# Patient Record
Sex: Female | Born: 1940
Health system: Southern US, Community
[De-identification: ages and names within clinical notes are randomized; demographics above are authoritative.]

## PROBLEM LIST (undated history)

## (undated) DIAGNOSIS — E785 Hyperlipidemia, unspecified: Secondary | ICD-10-CM

## (undated) DIAGNOSIS — F329 Major depressive disorder, single episode, unspecified: Secondary | ICD-10-CM

## (undated) DIAGNOSIS — Z9889 Other specified postprocedural states: Secondary | ICD-10-CM

## (undated) DIAGNOSIS — R112 Nausea with vomiting, unspecified: Secondary | ICD-10-CM

## (undated) DIAGNOSIS — C4491 Basal cell carcinoma of skin, unspecified: Secondary | ICD-10-CM

## (undated) DIAGNOSIS — B379 Candidiasis, unspecified: Secondary | ICD-10-CM

## (undated) DIAGNOSIS — I10 Essential (primary) hypertension: Secondary | ICD-10-CM

## (undated) DIAGNOSIS — M1990 Unspecified osteoarthritis, unspecified site: Secondary | ICD-10-CM

## (undated) DIAGNOSIS — N390 Urinary tract infection, site not specified: Secondary | ICD-10-CM

## (undated) DIAGNOSIS — C541 Malignant neoplasm of endometrium: Secondary | ICD-10-CM

## (undated) DIAGNOSIS — E119 Type 2 diabetes mellitus without complications: Secondary | ICD-10-CM

## (undated) DIAGNOSIS — C50919 Malignant neoplasm of unspecified site of unspecified female breast: Secondary | ICD-10-CM

## (undated) HISTORY — DX: Hyperlipidemia, unspecified: E78.5

## (undated) HISTORY — DX: Unspecified osteoarthritis, unspecified site: M19.90

## (undated) HISTORY — DX: Urinary tract infection, site not specified: N39.0

## (undated) HISTORY — PX: ABDOMINAL HYSTERECTOMY: SHX81

## (undated) HISTORY — PX: MASTECTOMY: SHX3

## (undated) HISTORY — DX: Essential (primary) hypertension: I10

## (undated) HISTORY — DX: Type 2 diabetes mellitus without complications: E11.9

## (undated) HISTORY — DX: Basal cell carcinoma of skin, unspecified: C44.91

## (undated) HISTORY — DX: Candidiasis, unspecified: B37.9

## (undated) HISTORY — DX: Major depressive disorder, single episode, unspecified: F32.9

---

## 1988-10-04 DIAGNOSIS — C50919 Malignant neoplasm of unspecified site of unspecified female breast: Secondary | ICD-10-CM

## 1988-10-04 HISTORY — DX: Malignant neoplasm of unspecified site of unspecified female breast: C50.919

## 2001-03-23 ENCOUNTER — Other Ambulatory Visit: Admission: RE | Admit: 2001-03-23 | Discharge: 2001-03-23 | Payer: Self-pay | Admitting: Family Medicine

## 2001-05-23 ENCOUNTER — Ambulatory Visit: Admission: RE | Admit: 2001-05-23 | Discharge: 2001-05-23 | Payer: Self-pay | Admitting: Gynecology

## 2001-06-27 ENCOUNTER — Inpatient Hospital Stay (HOSPITAL_COMMUNITY): Admission: RE | Admit: 2001-06-27 | Discharge: 2001-06-30 | Payer: Self-pay | Admitting: Gynecology

## 2001-08-09 ENCOUNTER — Ambulatory Visit: Admission: RE | Admit: 2001-08-09 | Discharge: 2001-08-09 | Payer: Self-pay | Admitting: Gynecology

## 2005-02-25 ENCOUNTER — Ambulatory Visit: Payer: Self-pay | Admitting: Obstetrics and Gynecology

## 2006-02-28 ENCOUNTER — Ambulatory Visit: Payer: Self-pay | Admitting: Family Medicine

## 2007-06-01 ENCOUNTER — Ambulatory Visit: Payer: Self-pay | Admitting: Family Medicine

## 2007-06-06 ENCOUNTER — Ambulatory Visit: Payer: Self-pay | Admitting: Unknown Physician Specialty

## 2008-06-03 ENCOUNTER — Ambulatory Visit: Payer: Self-pay | Admitting: Family Medicine

## 2009-06-10 ENCOUNTER — Ambulatory Visit: Payer: Self-pay | Admitting: Family Medicine

## 2009-12-18 ENCOUNTER — Ambulatory Visit: Payer: Self-pay | Admitting: Family Medicine

## 2010-06-11 ENCOUNTER — Ambulatory Visit: Payer: Self-pay | Admitting: Family Medicine

## 2011-06-17 ENCOUNTER — Ambulatory Visit: Payer: Self-pay | Admitting: Family Medicine

## 2012-01-19 DIAGNOSIS — E119 Type 2 diabetes mellitus without complications: Secondary | ICD-10-CM | POA: Diagnosis not present

## 2012-01-19 DIAGNOSIS — E785 Hyperlipidemia, unspecified: Secondary | ICD-10-CM | POA: Diagnosis not present

## 2012-01-19 DIAGNOSIS — I10 Essential (primary) hypertension: Secondary | ICD-10-CM | POA: Diagnosis not present

## 2012-01-19 DIAGNOSIS — M1990 Unspecified osteoarthritis, unspecified site: Secondary | ICD-10-CM | POA: Diagnosis not present

## 2012-01-24 DIAGNOSIS — Z79899 Other long term (current) drug therapy: Secondary | ICD-10-CM | POA: Diagnosis not present

## 2012-01-24 DIAGNOSIS — R5381 Other malaise: Secondary | ICD-10-CM | POA: Diagnosis not present

## 2012-01-24 DIAGNOSIS — E785 Hyperlipidemia, unspecified: Secondary | ICD-10-CM | POA: Diagnosis not present

## 2012-01-24 DIAGNOSIS — E119 Type 2 diabetes mellitus without complications: Secondary | ICD-10-CM | POA: Diagnosis not present

## 2012-01-24 DIAGNOSIS — I10 Essential (primary) hypertension: Secondary | ICD-10-CM | POA: Diagnosis not present

## 2012-05-24 DIAGNOSIS — E669 Obesity, unspecified: Secondary | ICD-10-CM | POA: Diagnosis not present

## 2012-05-24 DIAGNOSIS — R079 Chest pain, unspecified: Secondary | ICD-10-CM | POA: Diagnosis not present

## 2012-05-24 DIAGNOSIS — I10 Essential (primary) hypertension: Secondary | ICD-10-CM | POA: Diagnosis not present

## 2012-06-14 DIAGNOSIS — E7800 Pure hypercholesterolemia, unspecified: Secondary | ICD-10-CM | POA: Diagnosis not present

## 2012-06-14 DIAGNOSIS — I10 Essential (primary) hypertension: Secondary | ICD-10-CM | POA: Diagnosis not present

## 2012-06-14 DIAGNOSIS — E669 Obesity, unspecified: Secondary | ICD-10-CM | POA: Diagnosis not present

## 2012-06-14 DIAGNOSIS — Z23 Encounter for immunization: Secondary | ICD-10-CM | POA: Diagnosis not present

## 2012-06-14 DIAGNOSIS — E119 Type 2 diabetes mellitus without complications: Secondary | ICD-10-CM | POA: Diagnosis not present

## 2012-06-19 ENCOUNTER — Ambulatory Visit: Payer: Self-pay | Admitting: Family Medicine

## 2012-06-19 DIAGNOSIS — Z853 Personal history of malignant neoplasm of breast: Secondary | ICD-10-CM | POA: Diagnosis not present

## 2012-06-19 DIAGNOSIS — Z1231 Encounter for screening mammogram for malignant neoplasm of breast: Secondary | ICD-10-CM | POA: Diagnosis not present

## 2012-06-19 DIAGNOSIS — Z9010 Acquired absence of unspecified breast and nipple: Secondary | ICD-10-CM | POA: Diagnosis not present

## 2012-09-13 DIAGNOSIS — I10 Essential (primary) hypertension: Secondary | ICD-10-CM | POA: Diagnosis not present

## 2012-09-13 DIAGNOSIS — C50929 Malignant neoplasm of unspecified site of unspecified male breast: Secondary | ICD-10-CM | POA: Diagnosis not present

## 2012-09-13 DIAGNOSIS — E119 Type 2 diabetes mellitus without complications: Secondary | ICD-10-CM | POA: Diagnosis not present

## 2012-09-13 DIAGNOSIS — Z23 Encounter for immunization: Secondary | ICD-10-CM | POA: Diagnosis not present

## 2012-09-13 DIAGNOSIS — E7800 Pure hypercholesterolemia, unspecified: Secondary | ICD-10-CM | POA: Diagnosis not present

## 2012-10-30 DIAGNOSIS — I10 Essential (primary) hypertension: Secondary | ICD-10-CM | POA: Diagnosis not present

## 2012-10-30 DIAGNOSIS — Z23 Encounter for immunization: Secondary | ICD-10-CM | POA: Diagnosis not present

## 2012-10-30 DIAGNOSIS — J029 Acute pharyngitis, unspecified: Secondary | ICD-10-CM | POA: Diagnosis not present

## 2012-12-27 DIAGNOSIS — I10 Essential (primary) hypertension: Secondary | ICD-10-CM | POA: Diagnosis not present

## 2012-12-27 DIAGNOSIS — E119 Type 2 diabetes mellitus without complications: Secondary | ICD-10-CM | POA: Diagnosis not present

## 2012-12-27 DIAGNOSIS — C50919 Malignant neoplasm of unspecified site of unspecified female breast: Secondary | ICD-10-CM | POA: Diagnosis not present

## 2012-12-27 DIAGNOSIS — E785 Hyperlipidemia, unspecified: Secondary | ICD-10-CM | POA: Diagnosis not present

## 2012-12-27 LAB — HM COLONOSCOPY

## 2013-03-28 DIAGNOSIS — I10 Essential (primary) hypertension: Secondary | ICD-10-CM | POA: Diagnosis not present

## 2013-03-28 DIAGNOSIS — E119 Type 2 diabetes mellitus without complications: Secondary | ICD-10-CM | POA: Diagnosis not present

## 2013-03-28 DIAGNOSIS — C50919 Malignant neoplasm of unspecified site of unspecified female breast: Secondary | ICD-10-CM | POA: Diagnosis not present

## 2013-03-28 DIAGNOSIS — E7800 Pure hypercholesterolemia, unspecified: Secondary | ICD-10-CM | POA: Diagnosis not present

## 2013-04-05 DIAGNOSIS — E119 Type 2 diabetes mellitus without complications: Secondary | ICD-10-CM | POA: Diagnosis not present

## 2013-06-20 ENCOUNTER — Ambulatory Visit: Payer: Self-pay | Admitting: Family Medicine

## 2013-06-20 DIAGNOSIS — Z853 Personal history of malignant neoplasm of breast: Secondary | ICD-10-CM | POA: Diagnosis not present

## 2013-06-20 DIAGNOSIS — Z9010 Acquired absence of unspecified breast and nipple: Secondary | ICD-10-CM | POA: Diagnosis not present

## 2013-06-20 DIAGNOSIS — Z1231 Encounter for screening mammogram for malignant neoplasm of breast: Secondary | ICD-10-CM | POA: Diagnosis not present

## 2013-08-01 DIAGNOSIS — Z0000 Encounter for general adult medical examination without abnormal findings: Secondary | ICD-10-CM | POA: Diagnosis not present

## 2013-08-01 DIAGNOSIS — Z1339 Encounter for screening examination for other mental health and behavioral disorders: Secondary | ICD-10-CM | POA: Diagnosis not present

## 2013-08-01 DIAGNOSIS — Z23 Encounter for immunization: Secondary | ICD-10-CM | POA: Diagnosis not present

## 2013-08-01 DIAGNOSIS — Z1331 Encounter for screening for depression: Secondary | ICD-10-CM | POA: Diagnosis not present

## 2013-08-23 DIAGNOSIS — I789 Disease of capillaries, unspecified: Secondary | ICD-10-CM | POA: Diagnosis not present

## 2013-08-23 DIAGNOSIS — L578 Other skin changes due to chronic exposure to nonionizing radiation: Secondary | ICD-10-CM | POA: Diagnosis not present

## 2013-08-23 DIAGNOSIS — L821 Other seborrheic keratosis: Secondary | ICD-10-CM | POA: Diagnosis not present

## 2013-08-23 DIAGNOSIS — L570 Actinic keratosis: Secondary | ICD-10-CM | POA: Diagnosis not present

## 2013-08-23 DIAGNOSIS — L820 Inflamed seborrheic keratosis: Secondary | ICD-10-CM | POA: Diagnosis not present

## 2013-09-19 ENCOUNTER — Ambulatory Visit: Payer: Self-pay | Admitting: Family Medicine

## 2013-09-19 DIAGNOSIS — J984 Other disorders of lung: Secondary | ICD-10-CM | POA: Diagnosis not present

## 2013-09-19 DIAGNOSIS — E7800 Pure hypercholesterolemia, unspecified: Secondary | ICD-10-CM | POA: Diagnosis not present

## 2013-09-19 DIAGNOSIS — I10 Essential (primary) hypertension: Secondary | ICD-10-CM | POA: Diagnosis not present

## 2013-09-19 DIAGNOSIS — E119 Type 2 diabetes mellitus without complications: Secondary | ICD-10-CM | POA: Diagnosis not present

## 2013-09-19 DIAGNOSIS — R918 Other nonspecific abnormal finding of lung field: Secondary | ICD-10-CM | POA: Diagnosis not present

## 2013-09-19 DIAGNOSIS — M7040 Prepatellar bursitis, unspecified knee: Secondary | ICD-10-CM | POA: Diagnosis not present

## 2013-09-19 DIAGNOSIS — R059 Cough, unspecified: Secondary | ICD-10-CM | POA: Diagnosis not present

## 2013-09-19 DIAGNOSIS — J40 Bronchitis, not specified as acute or chronic: Secondary | ICD-10-CM | POA: Diagnosis not present

## 2013-10-24 ENCOUNTER — Ambulatory Visit: Payer: Self-pay | Admitting: Family Medicine

## 2013-10-24 DIAGNOSIS — R059 Cough, unspecified: Secondary | ICD-10-CM | POA: Diagnosis not present

## 2013-10-24 DIAGNOSIS — Z23 Encounter for immunization: Secondary | ICD-10-CM | POA: Diagnosis not present

## 2013-10-24 DIAGNOSIS — J984 Other disorders of lung: Secondary | ICD-10-CM | POA: Diagnosis not present

## 2013-10-24 DIAGNOSIS — R918 Other nonspecific abnormal finding of lung field: Secondary | ICD-10-CM | POA: Diagnosis not present

## 2013-10-24 DIAGNOSIS — J40 Bronchitis, not specified as acute or chronic: Secondary | ICD-10-CM | POA: Diagnosis not present

## 2013-10-24 DIAGNOSIS — Z1331 Encounter for screening for depression: Secondary | ICD-10-CM | POA: Diagnosis not present

## 2013-10-31 DIAGNOSIS — E119 Type 2 diabetes mellitus without complications: Secondary | ICD-10-CM | POA: Diagnosis not present

## 2013-10-31 DIAGNOSIS — I10 Essential (primary) hypertension: Secondary | ICD-10-CM | POA: Diagnosis not present

## 2013-10-31 DIAGNOSIS — E7800 Pure hypercholesterolemia, unspecified: Secondary | ICD-10-CM | POA: Diagnosis not present

## 2013-10-31 DIAGNOSIS — M7040 Prepatellar bursitis, unspecified knee: Secondary | ICD-10-CM | POA: Diagnosis not present

## 2013-10-31 DIAGNOSIS — J069 Acute upper respiratory infection, unspecified: Secondary | ICD-10-CM | POA: Diagnosis not present

## 2013-11-02 DIAGNOSIS — E119 Type 2 diabetes mellitus without complications: Secondary | ICD-10-CM | POA: Diagnosis not present

## 2013-11-02 DIAGNOSIS — E785 Hyperlipidemia, unspecified: Secondary | ICD-10-CM | POA: Diagnosis not present

## 2013-11-22 DIAGNOSIS — M1710 Unilateral primary osteoarthritis, unspecified knee: Secondary | ICD-10-CM | POA: Diagnosis not present

## 2013-12-11 DIAGNOSIS — L820 Inflamed seborrheic keratosis: Secondary | ICD-10-CM | POA: Diagnosis not present

## 2013-12-11 DIAGNOSIS — L578 Other skin changes due to chronic exposure to nonionizing radiation: Secondary | ICD-10-CM | POA: Diagnosis not present

## 2013-12-11 DIAGNOSIS — L821 Other seborrheic keratosis: Secondary | ICD-10-CM | POA: Diagnosis not present

## 2013-12-11 DIAGNOSIS — L570 Actinic keratosis: Secondary | ICD-10-CM | POA: Diagnosis not present

## 2014-03-07 DIAGNOSIS — F411 Generalized anxiety disorder: Secondary | ICD-10-CM | POA: Diagnosis not present

## 2014-03-07 DIAGNOSIS — E119 Type 2 diabetes mellitus without complications: Secondary | ICD-10-CM | POA: Diagnosis not present

## 2014-03-07 DIAGNOSIS — C50919 Malignant neoplasm of unspecified site of unspecified female breast: Secondary | ICD-10-CM | POA: Diagnosis not present

## 2014-03-07 DIAGNOSIS — M7040 Prepatellar bursitis, unspecified knee: Secondary | ICD-10-CM | POA: Diagnosis not present

## 2014-03-07 DIAGNOSIS — F320 Major depressive disorder, single episode, mild: Secondary | ICD-10-CM | POA: Diagnosis not present

## 2014-06-06 DIAGNOSIS — Z23 Encounter for immunization: Secondary | ICD-10-CM | POA: Diagnosis not present

## 2014-06-06 DIAGNOSIS — C549 Malignant neoplasm of corpus uteri, unspecified: Secondary | ICD-10-CM | POA: Diagnosis not present

## 2014-06-06 DIAGNOSIS — M7040 Prepatellar bursitis, unspecified knee: Secondary | ICD-10-CM | POA: Diagnosis not present

## 2014-06-06 DIAGNOSIS — E785 Hyperlipidemia, unspecified: Secondary | ICD-10-CM | POA: Diagnosis not present

## 2014-06-06 DIAGNOSIS — E1129 Type 2 diabetes mellitus with other diabetic kidney complication: Secondary | ICD-10-CM | POA: Diagnosis not present

## 2014-06-06 DIAGNOSIS — C50919 Malignant neoplasm of unspecified site of unspecified female breast: Secondary | ICD-10-CM | POA: Diagnosis not present

## 2014-07-12 ENCOUNTER — Ambulatory Visit: Payer: Self-pay | Admitting: Family Medicine

## 2014-07-12 DIAGNOSIS — Z853 Personal history of malignant neoplasm of breast: Secondary | ICD-10-CM | POA: Diagnosis not present

## 2014-07-12 DIAGNOSIS — Z1231 Encounter for screening mammogram for malignant neoplasm of breast: Secondary | ICD-10-CM | POA: Diagnosis not present

## 2014-07-12 LAB — HM MAMMOGRAPHY

## 2014-08-01 DIAGNOSIS — Z23 Encounter for immunization: Secondary | ICD-10-CM | POA: Diagnosis not present

## 2014-09-16 DIAGNOSIS — J40 Bronchitis, not specified as acute or chronic: Secondary | ICD-10-CM | POA: Diagnosis not present

## 2014-09-16 DIAGNOSIS — C50919 Malignant neoplasm of unspecified site of unspecified female breast: Secondary | ICD-10-CM | POA: Diagnosis not present

## 2014-09-16 DIAGNOSIS — R05 Cough: Secondary | ICD-10-CM | POA: Diagnosis not present

## 2014-09-16 DIAGNOSIS — J0100 Acute maxillary sinusitis, unspecified: Secondary | ICD-10-CM | POA: Diagnosis not present

## 2014-10-02 DIAGNOSIS — J0100 Acute maxillary sinusitis, unspecified: Secondary | ICD-10-CM | POA: Diagnosis not present

## 2014-10-02 DIAGNOSIS — C50919 Malignant neoplasm of unspecified site of unspecified female breast: Secondary | ICD-10-CM | POA: Diagnosis not present

## 2014-10-02 DIAGNOSIS — Z0000 Encounter for general adult medical examination without abnormal findings: Secondary | ICD-10-CM | POA: Diagnosis not present

## 2014-10-02 DIAGNOSIS — R05 Cough: Secondary | ICD-10-CM | POA: Diagnosis not present

## 2014-11-27 DIAGNOSIS — C50919 Malignant neoplasm of unspecified site of unspecified female breast: Secondary | ICD-10-CM | POA: Diagnosis not present

## 2014-11-27 DIAGNOSIS — I10 Essential (primary) hypertension: Secondary | ICD-10-CM | POA: Diagnosis not present

## 2014-11-27 DIAGNOSIS — E119 Type 2 diabetes mellitus without complications: Secondary | ICD-10-CM | POA: Diagnosis not present

## 2014-11-27 DIAGNOSIS — E785 Hyperlipidemia, unspecified: Secondary | ICD-10-CM | POA: Diagnosis not present

## 2014-11-27 DIAGNOSIS — E669 Obesity, unspecified: Secondary | ICD-10-CM | POA: Diagnosis not present

## 2014-11-27 LAB — LIPID PANEL
CHOLESTEROL: 168 mg/dL (ref 0–200)
HDL: 47 mg/dL (ref 35–70)
LDL CALC: 70 mg/dL
LDl/HDL Ratio: 1.5
TRIGLYCERIDES: 253 mg/dL — AB (ref 40–160)

## 2014-11-27 LAB — BASIC METABOLIC PANEL
BUN: 22 mg/dL — AB (ref 4–21)
CREATININE: 0.8 mg/dL (ref 0.5–1.1)
GLUCOSE: 188 mg/dL
POTASSIUM: 5 mmol/L (ref 3.4–5.3)
SODIUM: 142 mmol/L (ref 137–147)

## 2014-11-27 LAB — HEPATIC FUNCTION PANEL
ALT: 11 U/L (ref 7–35)
AST: 12 U/L — AB (ref 13–35)
Alkaline Phosphatase: 85 U/L (ref 25–125)
BILIRUBIN, TOTAL: 0.3 mg/dL

## 2014-11-27 LAB — HEMOGLOBIN A1C: HEMOGLOBIN A1C: 8.1 % — AB (ref 4.0–6.0)

## 2014-11-27 LAB — CBC AND DIFFERENTIAL
HCT: 42 % (ref 36–46)
Hemoglobin: 14 g/dL (ref 12.0–16.0)
Neutrophils Absolute: 58 /uL
PLATELETS: 264 10*3/uL (ref 150–399)
WBC: 7 10^3/mL

## 2014-11-27 LAB — TSH: TSH: 2.26 u[IU]/mL (ref 0.41–5.90)

## 2015-02-07 DIAGNOSIS — C44310 Basal cell carcinoma of skin of unspecified parts of face: Secondary | ICD-10-CM | POA: Insufficient documentation

## 2015-02-07 DIAGNOSIS — E782 Mixed hyperlipidemia: Secondary | ICD-10-CM | POA: Insufficient documentation

## 2015-02-07 DIAGNOSIS — K3189 Other diseases of stomach and duodenum: Secondary | ICD-10-CM | POA: Insufficient documentation

## 2015-02-07 DIAGNOSIS — C549 Malignant neoplasm of corpus uteri, unspecified: Secondary | ICD-10-CM | POA: Insufficient documentation

## 2015-02-07 DIAGNOSIS — N950 Postmenopausal bleeding: Secondary | ICD-10-CM | POA: Insufficient documentation

## 2015-02-07 DIAGNOSIS — R5383 Other fatigue: Secondary | ICD-10-CM

## 2015-02-07 DIAGNOSIS — M1990 Unspecified osteoarthritis, unspecified site: Secondary | ICD-10-CM | POA: Insufficient documentation

## 2015-02-07 DIAGNOSIS — H6980 Other specified disorders of Eustachian tube, unspecified ear: Secondary | ICD-10-CM | POA: Insufficient documentation

## 2015-02-07 DIAGNOSIS — C50919 Malignant neoplasm of unspecified site of unspecified female breast: Secondary | ICD-10-CM | POA: Insufficient documentation

## 2015-02-07 DIAGNOSIS — F29 Unspecified psychosis not due to a substance or known physiological condition: Secondary | ICD-10-CM | POA: Insufficient documentation

## 2015-02-07 DIAGNOSIS — N840 Polyp of corpus uteri: Secondary | ICD-10-CM | POA: Insufficient documentation

## 2015-02-07 DIAGNOSIS — I10 Essential (primary) hypertension: Secondary | ICD-10-CM | POA: Insufficient documentation

## 2015-02-07 DIAGNOSIS — E876 Hypokalemia: Secondary | ICD-10-CM | POA: Insufficient documentation

## 2015-02-07 DIAGNOSIS — M461 Sacroiliitis, not elsewhere classified: Secondary | ICD-10-CM | POA: Insufficient documentation

## 2015-02-07 DIAGNOSIS — F329 Major depressive disorder, single episode, unspecified: Secondary | ICD-10-CM | POA: Insufficient documentation

## 2015-02-07 DIAGNOSIS — J309 Allergic rhinitis, unspecified: Secondary | ICD-10-CM | POA: Insufficient documentation

## 2015-02-07 DIAGNOSIS — Z79890 Hormone replacement therapy (postmenopausal): Secondary | ICD-10-CM | POA: Insufficient documentation

## 2015-02-07 DIAGNOSIS — E119 Type 2 diabetes mellitus without complications: Secondary | ICD-10-CM | POA: Insufficient documentation

## 2015-02-07 DIAGNOSIS — R5381 Other malaise: Secondary | ICD-10-CM | POA: Insufficient documentation

## 2015-02-07 DIAGNOSIS — E669 Obesity, unspecified: Secondary | ICD-10-CM | POA: Insufficient documentation

## 2015-03-26 ENCOUNTER — Encounter: Payer: Self-pay | Admitting: Family Medicine

## 2015-03-26 ENCOUNTER — Ambulatory Visit (INDEPENDENT_AMBULATORY_CARE_PROVIDER_SITE_OTHER): Payer: Medicare Other | Admitting: Family Medicine

## 2015-03-26 VITALS — BP 114/72 | HR 78 | Temp 98.5°F | Resp 18 | Ht 64.0 in | Wt 209.0 lb

## 2015-03-26 DIAGNOSIS — E669 Obesity, unspecified: Secondary | ICD-10-CM | POA: Diagnosis not present

## 2015-03-26 DIAGNOSIS — I10 Essential (primary) hypertension: Secondary | ICD-10-CM

## 2015-03-26 DIAGNOSIS — E119 Type 2 diabetes mellitus without complications: Secondary | ICD-10-CM

## 2015-03-26 MED ORDER — CANAGLIFLOZIN-METFORMIN HCL 150-500 MG PO TABS: 1.0000 | ORAL_TABLET | Freq: Two times a day (BID) | ORAL | Status: DC

## 2015-03-26 NOTE — Progress Notes (Signed)
Patient ID: Lori Berger, female   DOB: [birth year], 74 y.o.   MRN: [id number]    Subjective:  Hypertension This is a chronic (HEre for follow up. She does check her B/P and readings are usually around 121/65.) problem. Pertinent negatives include no anxiety, blurred vision, chest pain, headaches, malaise/fatigue, neck pain, palpitations or shortness of breath.  Diabetes She presents for her follow-up diabetic visit. She has type 2 diabetes mellitus. There are no hypoglycemic associated symptoms. Pertinent negatives for hypoglycemia include no dizziness or headaches. Associated symptoms include foot paresthesias. Pertinent negatives for diabetes include no blurred vision, no chest pain, no foot ulcerations, no polyuria and no weakness. There are no hypoglycemic complications. There are no diabetic complications. Risk factors for coronary artery disease include obesity, hypertension and diabetes mellitus. Current diabetic treatment includes oral agent (dual therapy) (she is taking Glimiperide and Janduato. Due to cost wants to discuss switching Jentaduato to something else.). She is compliant with treatment all of the time. She participates in exercise intermittently. (Around 170 usually.) She does not see a podiatrist.Eye exam is not current.   Knee pain-chronic Chronic issue. Right knee is bone on bone per ortho she seen before and her left knee is bothering her now too. She has hard time walking on uneven ground and longer distance.   Prior to Admission medications   Medication Sig Start Date End Date Taking? Authorizing Provider  amLODipine (NORVASC) 5 MG tablet Take 1 tablet by mouth daily. 10/02/14   Historical Provider, MD  glimepiride (AMARYL) 4 MG tablet Take 1 tablet by mouth daily. 10/02/14   Historical Provider, MD  Linagliptin-Metformin HCl 2.5-500 MG TABS Take 1 tablet by mouth 2 (two) times daily.  01/09/15   Historical Provider, MD  lisinopril (PRINIVIL,ZESTRIL) 20 MG tablet Take 1  tablet by mouth daily. 10/02/14   Historical Provider, MD  simvastatin (ZOCOR) 10 MG tablet Take 1 tablet by mouth at bedtime. 12/23/14   Historical Provider, MD    Patient Active Problem List   Diagnosis Date Noted  . Basal cell carcinoma of face 02/07/2015  . Breast CA 02/07/2015  . Diabetes mellitus, type 2 02/07/2015  . Dysfunction of eustachian tube 02/07/2015  . 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 Malignant neoplasm of corpus uteri, except isthmus 02/07/2015  . Polyp of corpus uteri 02/07/2015  . Essential (primary) hypertension 02/07/2015  . Need for prophylactic hormone replacement therapy (postmenopausal) 02/07/2015  . Decreased potassium in the blood 02/07/2015  . 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 Malaise and fatigue 02/07/2015  . Combined fat and carbohydrate induced hyperlipemia 02/07/2015  . Adiposity 02/07/2015  . Arthritis, degenerative 02/07/2015  . Allergic rhinitis 02/07/2015  . Postmenopausal bleeding 02/07/2015  . Psychosis 02/07/2015  . Inflammation of sacroiliac joint 02/07/2015    No past medical history on file.  History   Social History  . Marital Status: Widowed    Spouse Name: widowed  . Number of Children: 3  . Years of Education: N/A   Occupational History  . Not on file.   Social History Main Topics  . Smoking status: Never Smoker   . Smokeless tobacco: Never Used  . Alcohol Use: No  . Drug Use: No  . Sexual Activity: No   Other Topics Concern  . Not on file   Social History Narrative    Allergies  Allergen Reactions  . Penicillins     Review of Systems  Constitutional: Negative for fever, chills and malaise/fatigue.  Eyes:  Negative for blurred vision.  Respiratory: Negative for cough, shortness of breath and wheezing.   Cardiovascular: Positive for leg swelling (some edema in her feet and lower legs at times.). Negative for chest pain and palpitations.  Gastrointestinal: Negative for heartburn,  nausea, vomiting and abdominal pain.  Genitourinary: Negative.   Musculoskeletal: Positive for joint pain. Negative for myalgias, back pain and neck pain.  Neurological: Negative for dizziness, weakness and headaches.  Endo/Heme/Allergies: Negative.   Psychiatric/Behavioral: Negative.     Immunization History  Administered Date(s) Administered  . Pneumococcal Conjugate-13 06/06/2014  . Pneumococcal Polysaccharide-23 06/14/2012  . Tdap 05/31/2011  . Zoster 11/17/2007   Objective:  BP 114/72 mmHg  Pulse 78  Temp(Src) 98.5 F (36.9 C)  Resp 18  Ht 5\' 4"  (1.626 m)  Wt 209 lb (94.802 kg)  BMI 35.86 kg/m2  Physical Exam  Constitutional: She is oriented to person, place, and time and well-developed, well-nourished, and in no distress. No distress.  HENT:  Head: Normocephalic and atraumatic.  Right Ear: External ear normal.  Left Ear: External ear normal.  Nose: Nose normal.  Eyes: Conjunctivae and EOM are normal. Pupils are equal, round, and reactive to light.  Neck: Normal range of motion. Neck supple.  Cardiovascular: Normal rate, regular rhythm, normal heart sounds and intact distal pulses.   No murmur heard. Pulmonary/Chest: Effort normal and breath sounds normal. She has no wheezes. She exhibits no tenderness.  Abdominal: Soft. Bowel sounds are normal. There is no tenderness.  Musculoskeletal: Normal range of motion.  Neurological: She is alert and oriented to person, place, and time.  Skin: Skin is warm and dry.  Psychiatric: Mood, memory, affect and judgment normal.    Lab Results  Component Value Date   WBC 7.0 11/27/2014   HGB 14.0 11/27/2014   HCT 42 11/27/2014   PLT 264 11/27/2014   CHOL 168 11/27/2014   TRIG 253* 11/27/2014   HDL 47 11/27/2014   LDLCALC 70 11/27/2014   TSH 2.26 11/27/2014   HGBA1C 8.1* 11/27/2014    CMP     Component Value Date/Time   NA 142 11/27/2014   K 5.0 11/27/2014   BUN 22* 11/27/2014   CREATININE 0.8 11/27/2014   AST 12*  11/27/2014   ALT 11 11/27/2014   ALKPHOS 85 11/27/2014    Assessment and Plan :  1. Essential (primary) hypertension Stable.  2. Type 2 diabetes mellitus without complication L8G 8.4 today. Worse. Due to insurance will stop Jentadueto and start Invokamet. Will not make any other changes at this time and re access her sugar level on the next visit. RTC 3-4 months.  3. Adiposity Work on diet and exercise as much as she can.  4. Chronic knee pain Handicap form filled out and advised patient this is temporarily at this time and to continue trying to stay active. May need refferral to ortho in the future.  5. Breast cancer    Patient was seen and examined by Dr. Eulas Post and note was scribed by Theressa Millard, RMA.  Miguel Aschoff MD [facility] 03/26/2015 11:45 AM

## 2015-04-30 ENCOUNTER — Encounter: Payer: Self-pay | Admitting: Family Medicine

## 2015-04-30 ENCOUNTER — Other Ambulatory Visit: Payer: Self-pay | Admitting: Family Medicine

## 2015-04-30 ENCOUNTER — Ambulatory Visit (INDEPENDENT_AMBULATORY_CARE_PROVIDER_SITE_OTHER): Payer: Medicare Other | Admitting: Family Medicine

## 2015-04-30 VITALS — BP 112/72 | HR 90 | Temp 97.9°F | Resp 16 | Wt 203.4 lb

## 2015-04-30 DIAGNOSIS — N3090 Cystitis, unspecified without hematuria: Secondary | ICD-10-CM

## 2015-04-30 LAB — POCT URINALYSIS DIPSTICK
Glucose, UA: 2
NITRITE UA: POSITIVE
Protein, UA: 100
UROBILINOGEN UA: 0.2
pH, UA: 5

## 2015-04-30 MED ORDER — SULFAMETHOXAZOLE-TRIMETHOPRIM 800-160 MG PO TABS: 1.0000 | ORAL_TABLET | Freq: Two times a day (BID) | ORAL | Status: DC

## 2015-04-30 NOTE — Patient Instructions (Signed)
We will call you with urine culture results.

## 2015-04-30 NOTE — Progress Notes (Signed)
Subjective:     Patient ID: Lori Berger, female   DOB: 09-09-1941, 74 y.o.   MRN: [id number]  HPI  Chief Complaint  Patient presents with  . Urinary Frequency    Patient comes in office today with complaints of frequency of urination that began on Monday 7/25 and burining with urination began 07/26  She was recently started on canagliflozin for diabetes. No recent history of UTI.   Review of Systems  Constitutional: Negative for fever and chills.        Objective:   Physical Exam  Constitutional: She appears well-developed and well-nourished. No distress.  Genitourinary:  No cva tenderness       Assessment:    1. Cystitis without hematuri - sulfamethoxazole-trimethoprim (BACTRIM DS,SEPTRA DS) 800-160 MG per tablet; Take 1 tablet by mouth 2 (two) times daily.  Dispense: 14 tablet; Refill: 0 - Urine culture - POCT urinalysis dipstick    Plan:    F/u pending urine culture.

## 2015-05-03 LAB — URINE CULTURE

## 2015-05-05 ENCOUNTER — Telehealth: Payer: Self-pay

## 2015-05-05 NOTE — Telephone Encounter (Signed)
-----   Message from Carmon Ginsberg, [place] sent at 05/05/2015  8:30 AM EDT ----- Continue Sulfa antibiotic for a routine E. Coli bladder infection.

## 2015-05-05 NOTE — Telephone Encounter (Signed)
Patient has been advised of results.  

## 2015-05-29 DIAGNOSIS — L578 Other skin changes due to chronic exposure to nonionizing radiation: Secondary | ICD-10-CM | POA: Diagnosis not present

## 2015-05-29 DIAGNOSIS — L820 Inflamed seborrheic keratosis: Secondary | ICD-10-CM | POA: Diagnosis not present

## 2015-05-29 DIAGNOSIS — R208 Other disturbances of skin sensation: Secondary | ICD-10-CM | POA: Diagnosis not present

## 2015-05-29 DIAGNOSIS — L821 Other seborrheic keratosis: Secondary | ICD-10-CM | POA: Diagnosis not present

## 2015-06-25 ENCOUNTER — Ambulatory Visit (INDEPENDENT_AMBULATORY_CARE_PROVIDER_SITE_OTHER): Payer: Medicare Other | Admitting: Family Medicine

## 2015-06-25 ENCOUNTER — Encounter: Payer: Self-pay | Admitting: Family Medicine

## 2015-06-25 VITALS — BP 118/70 | HR 80 | Temp 98.3°F | Resp 16 | Ht 64.0 in | Wt 202.0 lb

## 2015-06-25 DIAGNOSIS — E7800 Pure hypercholesterolemia, unspecified: Secondary | ICD-10-CM

## 2015-06-25 DIAGNOSIS — E119 Type 2 diabetes mellitus without complications: Secondary | ICD-10-CM

## 2015-06-25 DIAGNOSIS — Z23 Encounter for immunization: Secondary | ICD-10-CM

## 2015-06-25 LAB — POCT GLYCOSYLATED HEMOGLOBIN (HGB A1C): HEMOGLOBIN A1C: 7.4

## 2015-06-25 NOTE — Progress Notes (Signed)
Patient ID: Lori Berger, female   DOB: Feb 13, 1941, 74 y.o.   MRN: [id number]       Patient: Lori Berger Female    DOB: [birth year]   74 y.o.   MRN: [id number] Visit Date: 06/25/2015  Today's Neeka Urista: Wilhemena Durie, MD   Chief Complaint  Patient presents with  . Diabetes    3 month F/U   Subjective:    Diabetes She has type 2 diabetes mellitus. Her disease course has been stable. There are no hypoglycemic associated symptoms. There are no diabetic associated symptoms. There are no hypoglycemic complications. Symptoms are stable. There are no diabetic complications. She is compliant with treatment all of the time. She does not see a podiatrist.Eye exam is not current.  Patient was seen in the office about 3 months ago and was started on Invokamet. Patient had to discontinue Jentadueto due to cost. Patient reports that she has not had any side effects since starting medication.      Allergies  Allergen Reactions  . Penicillins    Previous Medications   AMLODIPINE (NORVASC) 5 MG TABLET    Take 1 tablet by mouth daily.   CANAGLIFLOZIN-METFORMIN HCL 150-500 MG TABS    Take 1 tablet by mouth 2 (two) times daily.   GLIMEPIRIDE (AMARYL) 4 MG TABLET    Take 1 tablet by mouth daily.   LISINOPRIL (PRINIVIL,ZESTRIL) 20 MG TABLET    Take 1 tablet by mouth daily.   SIMVASTATIN (ZOCOR) 10 MG TABLET    Take 1 tablet by mouth at bedtime.   SULFAMETHOXAZOLE-TRIMETHOPRIM (BACTRIM DS,SEPTRA DS) 800-160 MG PER TABLET    Take 1 tablet by mouth 2 (two) times daily.    Review of Systems  Constitutional: Negative.   Respiratory: Negative.   Cardiovascular: Negative.   Endocrine: Negative.   Neurological: Negative.   Hematological: Negative.   Psychiatric/Behavioral: Negative.     Social History  Substance Use Topics  . Smoking status: Never Smoker   . Smokeless tobacco: Never Used  . Alcohol Use: No   Objective:   BP 118/70 mmHg  Pulse 80  Temp(Src) 98.3 F (36.8 C)  Resp 16   Ht 5\' 4"  (1.626 m)  Wt 202 lb (91.627 kg)  BMI 34.66 kg/m2  Physical Exam  Constitutional: She appears well-developed and well-nourished.  HENT:  Head: Normocephalic and atraumatic.  Right Ear: External ear normal.  Left Ear: External ear normal.  Nose: Nose normal.  Eyes: Conjunctivae are normal.  Neck: Neck supple.  Cardiovascular: Normal rate, regular rhythm and normal heart sounds.   Pulmonary/Chest: Effort normal and breath sounds normal.  Musculoskeletal: She exhibits no edema.  Skin: Skin is warm and dry.  Psychiatric: She has a normal mood and affect. Her behavior is normal. Judgment and thought content normal.        Assessment & Plan:     1. Type 2 diabetes mellitus without complication  -N3Z is 7.4 today--improving control. - Comprehensive Metabolic Panel (CMET)  2. Elevated cholesterol  - Comprehensive Metabolic Panel (CMET) - Lipid Profile  3. Flu vaccine need  - Flu vaccine HIGH DOSE PF (Fluzone High dose),e    4.[place], MD  [facility]

## 2015-06-26 LAB — LIPID PANEL
CHOL/HDL RATIO: 4 ratio (ref 0.0–4.4)
CHOLESTEROL TOTAL: 186 mg/dL (ref 100–199)
HDL: 47 mg/dL (ref >39)
LDL Calculated: 87 mg/dL (ref 0–99)
TRIGLYCERIDES: 259 mg/dL — AB (ref 0–149)
VLDL Cholesterol Cal: 52 mg/dL — ABNORMAL HIGH (ref 5–40)

## 2015-06-26 LAB — COMPREHENSIVE METABOLIC PANEL
A/G RATIO: 1.7 (ref 1.1–2.5)
ALK PHOS: 80 IU/L (ref 39–117)
ALT: 14 IU/L (ref 0–32)
AST: 20 IU/L (ref 0–40)
Albumin: 4.3 g/dL (ref 3.5–4.8)
BILIRUBIN TOTAL: 0.3 mg/dL (ref 0.0–1.2)
BUN/Creatinine Ratio: 18 (ref 11–26)
BUN: 18 mg/dL (ref 8–27)
CALCIUM: 9.6 mg/dL (ref 8.7–10.3)
CHLORIDE: 104 mmol/L (ref 97–108)
CO2: 19 mmol/L (ref 18–29)
Creatinine, Ser: 0.99 mg/dL (ref 0.57–1.00)
GFR calc Af Amer: 64 mL/min/{1.73_m2} (ref >59)
GFR, EST NON AFRICAN AMERICAN: 56 mL/min/{1.73_m2} — AB (ref >59)
Globulin, Total: 2.6 g/dL (ref 1.5–4.5)
Glucose: 145 mg/dL — ABNORMAL HIGH (ref 65–99)
POTASSIUM: 4.6 mmol/L (ref 3.5–5.2)
Sodium: 142 mmol/L (ref 134–144)
Total Protein: 6.9 g/dL (ref 6.0–8.5)

## 2015-06-30 ENCOUNTER — Telehealth: Payer: Self-pay

## 2015-06-30 NOTE — Telephone Encounter (Signed)
10:39 No answer  ED

## 2015-06-30 NOTE — Telephone Encounter (Signed)
Advised  ED 

## 2015-06-30 NOTE — Telephone Encounter (Signed)
-----   Message from Jerrol Banana., MD sent at 06/30/2015  8:15 AM EDT ----- Labs okay

## 2015-08-20 ENCOUNTER — Other Ambulatory Visit: Payer: Self-pay | Admitting: Family Medicine

## 2015-08-20 DIAGNOSIS — Z1231 Encounter for screening mammogram for malignant neoplasm of breast: Secondary | ICD-10-CM

## 2015-08-25 DIAGNOSIS — M1712 Unilateral primary osteoarthritis, left knee: Secondary | ICD-10-CM | POA: Diagnosis not present

## 2015-08-25 DIAGNOSIS — S93602A Unspecified sprain of left foot, initial encounter: Secondary | ICD-10-CM | POA: Diagnosis not present

## 2015-09-02 ENCOUNTER — Ambulatory Visit
Admission: RE | Admit: 2015-09-02 | Discharge: 2015-09-02 | Disposition: A | Discharge status: Home / Self Care | Payer: Medicare Other | Source: Ambulatory Visit | Attending: Family Medicine | Admitting: Family Medicine

## 2015-09-02 DIAGNOSIS — Z1231 Encounter for screening mammogram for malignant neoplasm of breast: Secondary | ICD-10-CM | POA: Insufficient documentation

## 2015-09-02 HISTORY — DX: Malignant neoplasm of endometrium: C54.1

## 2015-09-03 ENCOUNTER — Other Ambulatory Visit: Payer: Self-pay | Admitting: Family Medicine

## 2015-09-03 DIAGNOSIS — R928 Other abnormal and inconclusive findings on diagnostic imaging of breast: Secondary | ICD-10-CM

## 2015-09-08 ENCOUNTER — Encounter: Payer: Self-pay | Admitting: Family Medicine

## 2015-09-08 ENCOUNTER — Ambulatory Visit (INDEPENDENT_AMBULATORY_CARE_PROVIDER_SITE_OTHER): Payer: Medicare Other | Admitting: Family Medicine

## 2015-09-08 ENCOUNTER — Other Ambulatory Visit: Payer: Self-pay | Admitting: Family Medicine

## 2015-09-08 VITALS — BP 110/74 | HR 76 | Temp 97.8°F | Resp 16 | Wt 201.4 lb

## 2015-09-08 DIAGNOSIS — N3090 Cystitis, unspecified without hematuria: Secondary | ICD-10-CM

## 2015-09-08 LAB — POCT URINALYSIS DIPSTICK
Glucose, UA: 500
KETONES UA: NEGATIVE
Nitrite, UA: NEGATIVE
PROTEIN UA: 30
Urobilinogen, UA: 1
pH, UA: 5

## 2015-09-08 MED ORDER — SULFAMETHOXAZOLE-TRIMETHOPRIM 800-160 MG PO TABS: 1.0000 | ORAL_TABLET | Freq: Two times a day (BID) | ORAL | Status: DC

## 2015-09-08 NOTE — Progress Notes (Signed)
Subjective:     Patient ID: Lori Berger, female   DOB: September 14, 1941, 74 y.o.   MRN: [id number]  HPI  Chief Complaint  Patient presents with  . Dysuria    patient comes in office today with complaints of burning with urination and frequency for the pasty several days, patient reports having back pain associated.   Last treated for E. Coli infection in July.   Review of Systems  Constitutional: Negative for fever and chills.       Objective:   Physical Exam  Constitutional: She appears well-developed and well-nourished. No distress.  Genitourinary:  No c.v. tenderness       Assessment:    1. Cystiti - POCT urinalysis dipstick - Urine culture - sulfamethoxazole-trimethoprim (BACTRIM DS,SEPTRA DS) 800-160 MG tablet; Take 1 tablet by mouth 2 (two) times daily.  Dispense: 14 tablet; Refill: 0    Plan:    Further f/u pending urine culture report.

## 2015-09-08 NOTE — Patient Instructions (Signed)
We will call you with the culture report. 

## 2015-09-10 ENCOUNTER — Telehealth: Payer: Self-pay | Admitting: Family Medicine

## 2015-09-10 ENCOUNTER — Telehealth: Payer: Self-pay

## 2015-09-10 LAB — URINE CULTURE

## 2015-09-10 NOTE — Telephone Encounter (Signed)
Called but unable to leave a message-aa

## 2015-09-10 NOTE — Telephone Encounter (Signed)
-----   Message from Carmon Ginsberg, [place] sent at 09/10/2015  8:03 AM EST ----- No specific organism detected. May continue antibiotic if helping.  If symptoms are persisting may need to f/u with Dr. Rosanna Randy.

## 2015-09-10 NOTE — Telephone Encounter (Signed)
Patient has been advised. KW 

## 2015-09-10 NOTE — Telephone Encounter (Signed)
Spoke with patient and advised her that per report she needs another mammogram and US of the right breast. Advised her that it could be just benign cyst. She states that she had a cyst about 10 years ago and it was benign.-aa

## 2015-09-10 NOTE — Telephone Encounter (Signed)
Spoke with patient on phone today and she states that she had got report from mammogram saying there was an abnormality of right breast, patient states that she is concerned and was advised in letter to follow up with PCP. Patient would like a call back this afternoon from Dr. Rosanna Randy or his CMA in reference to this matter and if we will put in order for mammogram follow up? Patient states that she will be in meeting this morning and will be unreachable, she request call after lunch. KW

## 2015-09-15 ENCOUNTER — Ambulatory Visit
Admission: RE | Admit: 2015-09-15 | Discharge: 2015-09-15 | Disposition: A | Discharge status: Home / Self Care | Payer: Medicare Other | Source: Ambulatory Visit | Attending: Family Medicine | Admitting: Family Medicine

## 2015-09-15 DIAGNOSIS — R928 Other abnormal and inconclusive findings on diagnostic imaging of breast: Secondary | ICD-10-CM

## 2015-09-15 DIAGNOSIS — N63 Unspecified lump in breast: Secondary | ICD-10-CM | POA: Insufficient documentation

## 2015-09-15 HISTORY — DX: Malignant neoplasm of unspecified site of unspecified female breast: C50.919

## 2015-09-19 ENCOUNTER — Telehealth: Payer: Self-pay | Admitting: Family Medicine

## 2015-09-19 DIAGNOSIS — R928 Other abnormal and inconclusive findings on diagnostic imaging of breast: Secondary | ICD-10-CM

## 2015-09-19 NOTE — Telephone Encounter (Signed)
Pt called to get the results of her mammogram that was done again on 09/15/15 on her right breast. Pt stated that the she was advised that she needs an ultrasound and needle biopsy. Thanks TNP

## 2015-09-22 NOTE — Telephone Encounter (Signed)
She should have been called by radiology

## 2015-09-22 NOTE — Telephone Encounter (Signed)
Please review-aa 

## 2015-09-22 NOTE — Telephone Encounter (Signed)
Spoke with patient, they told her at [facility] that needle biopsy is recommended and to contact PCP, they told her she can get this done at a doctors office or at the hospital. She wants to go ahead and get set up with Dr. Fleet Contras. Order put in-aa

## 2015-09-25 ENCOUNTER — Ambulatory Visit (INDEPENDENT_AMBULATORY_CARE_PROVIDER_SITE_OTHER): Payer: Medicare Other | Admitting: General Surgery

## 2015-09-25 ENCOUNTER — Ambulatory Visit: Payer: PRIVATE HEALTH INSURANCE

## 2015-09-25 ENCOUNTER — Encounter: Payer: Self-pay | Admitting: General Surgery

## 2015-09-25 VITALS — BP 132/72 | HR 84 | Resp 16 | Ht 63.0 in | Wt 199.0 lb

## 2015-09-25 DIAGNOSIS — N63 Unspecified lump in breast: Secondary | ICD-10-CM

## 2015-09-25 DIAGNOSIS — C50911 Malignant neoplasm of unspecified site of right female breast: Secondary | ICD-10-CM | POA: Diagnosis not present

## 2015-09-25 DIAGNOSIS — N6310 Unspecified lump in the right breast, unspecified quadrant: Secondary | ICD-10-CM

## 2015-09-25 DIAGNOSIS — C50811 Malignant neoplasm of overlapping sites of right female breast: Secondary | ICD-10-CM | POA: Diagnosis not present

## 2015-09-25 NOTE — Progress Notes (Signed)
Patient ID: Lori Berger, female   DOB: 04-Jul-1941, 74 y.o.   MRN: [id number]  Chief Complaint  Patient presents with  . Other    mammogram    HPI Lori Berger is a 74 y.o. female here today for her follow up from a mammogram and right breast ultrasound she had done on 09/15/15. Patient states she did not fell any lumps in her right breast. In 1990 she had a left mastectomy performed.   I personally reviewed the patient's history. HPI  Past Medical History  Diagnosis Date  . Endometrial cancer ([place])   . Breast cancer ([place])     left 1990  . Diabetes mellitus without complication ([place])   . Hypertension   . Hyperlipidemia   . Arthritis     Past Surgical History  Procedure Laterality Date  . Abdominal hysterectomy    . Mastectomy Left     1990    Family History  Problem Relation Age of Onset  . Heart disease Mother     Fatal MI age 50  . Stroke Father     fatal age 103  . Cancer Sister     Colon  . Cancer Brother     Died from Kidney cancer  . Breast cancer Neg Hx     Social History Social History  Substance Use Topics  . Smoking status: Never Smoker   . Smokeless tobacco: Never Used  . Alcohol Use: No    Allergies  Allergen Reactions  . Penicillins     Current Outpatient Prescriptions  Medication Sig Dispense Refill  . amLODipine (NORVASC) 5 MG tablet Take 1 tablet by mouth daily.    . Canagliflozin-Metformin HCl 150-500 MG TABS Take 1 tablet by mouth 2 (two) times daily. 60 tablet 12  . glimepiride (AMARYL) 4 MG tablet Take 1 tablet by mouth daily.    Marland Kitchen lisinopril (PRINIVIL,ZESTRIL) 20 MG tablet Take 1 tablet by mouth daily.    . simvastatin (ZOCOR) 10 MG tablet Take 1 tablet by mouth at bedtime.     No current facility-administered medications for this visit.    Review of Systems Review of Systems  Constitutional: Negative.   Respiratory: Negative.   Cardiovascular: Negative.     Blood pressure 132/72, pulse 84, resp. rate 16, height 5\' 3"   (1.6 m), weight 199 lb (90.266 kg).  Physical Exam Physical Exam  Constitutional: She is oriented to person, place, and time. She appears well-developed and well-nourished.  Eyes: Conjunctivae are normal. No scleral icterus.  Neck: Neck supple.  Cardiovascular: Normal rate, regular rhythm and normal heart sounds.   Pulmonary/Chest: Effort normal and breath sounds normal. Right breast exhibits no inverted nipple, no nipple discharge, no skin change and no tenderness.  Left mastectomy site is well healed.  Lymphadenopathy:    She has no cervical adenopathy.    She has no axillary adenopathy.  Neurological: She is alert and oriented to person, place, and time.  Skin: Skin is warm and dry.    Data Reviewed Unilateral mammogram and ultrasound dated 09/15/2015 was reviewed. Developing mass within the medial aspect of the right breast, area more prominent from 2015. Ultrasound showed a 1.5 x 2.3 x 2.3 cm spiculated mass with irregular borders. BI-RADS-5.  Biopsy was recommended and accepted.  Ultrasound examination of the right breast at the 3:00 position, 6 cm from the nipple showed an hypoechoic mass with dense acoustic shadowing measuring slightly smaller than at the hospital study had 0.8 x 1.0 x  1.5 cm.  10 mL of 0.5% Xylocaine with 0.25% Marcaine with 1-200,000 of epinephrine was utilized well tolerated. A 14-gauge Finesse biopsy device was used and 5 core samples obtained. Moderate discomfort with needle passage. A postbiopsy clip was placed. No bleeding noted. Skin defect closed with benzoin and Steri-Strips followed by Telfa and Tegaderm dressing.  Assessment    Right breast cancer until proven otherwise.    Plan    The majority of the visit was spent reviewing options for breast cancer management. The patient will be contacted when biopsy results are available.  Breast conservation and mastectomy were presented as equivalent procedures. Even though she had a mastectomy on the  contralateral side, she has not obligated to have this on the right side if she desires breast conservation.  The visit lasted 45 minutes.     PCP:  Cranford Mon, Richard L This information has been scribed by Gaspar Cola CMA.   Robert Bellow 09/26/2015, 11:03 AM

## 2015-09-25 NOTE — Patient Instructions (Signed)
CARE AFTER BREAST BIOPSY  1. Leave the dressing on that your doctor applied after surgery. It is waterproof. You may bathe, shower and/or swim. The dressing will probably remain intact until your return office visit. If the dressing comes off, you will see Handyside strips of tape against your skin on the incision. Do not remove these strips.  2. You may want to use a gauze,cloth or similar protection in your bra to prevent rubbing against your dressing and incision. This is not necessary, but you may feel more comfortable doing so.  3. It is recommended that you wear a bra day and night to give support to the breast. This will prevent the weight of the breast from pulling on the incision.  4. Your breast will feel hard and lumpy under the incision. Do not be alarmed. This is the underlying stitching of tissue. Softening of this tissue will occur in time.  5. Make sure you call the office and schedule an appointment in one week after your surgery. The office phone number is [phone]. The nurses at [facility] may have already done this for you.  6. You will notice about a week after your office visit that the strips of the tape on your incision will begin to loosen. These may then be removed.  7. Report to your doctor any of the following:  * Severe pain not relieved by your pain medication  *Redness of the incision  * Drainage from the incision  *Fever greater than 101 degrees 

## 2015-09-26 DIAGNOSIS — C50911 Malignant neoplasm of unspecified site of right female breast: Secondary | ICD-10-CM | POA: Insufficient documentation

## 2015-10-08 ENCOUNTER — Telehealth: Payer: Self-pay | Admitting: *Deleted

## 2015-10-08 NOTE — Telephone Encounter (Signed)
Patient stated that she was suppose to call you after christmas for the next steps she needs to take after her right breast biopsy on 09/25/15, she is aware that you are in surgery today and will get in touch with her when you are available.

## 2015-10-09 ENCOUNTER — Telehealth: Payer: Self-pay | Admitting: General Surgery

## 2015-10-09 ENCOUNTER — Encounter: Payer: Self-pay | Admitting: Physician Assistant

## 2015-10-09 ENCOUNTER — Ambulatory Visit (INDEPENDENT_AMBULATORY_CARE_PROVIDER_SITE_OTHER): Payer: Medicare Other | Admitting: Physician Assistant

## 2015-10-09 VITALS — BP 150/80 | HR 73 | Temp 98.2°F | Resp 17 | Wt 199.4 lb

## 2015-10-09 DIAGNOSIS — J40 Bronchitis, not specified as acute or chronic: Secondary | ICD-10-CM

## 2015-10-09 MED ORDER — PREDNISONE 10 MG PO TABS: ORAL_TABLET | ORAL | Status: DC

## 2015-10-09 MED ORDER — AZITHROMYCIN 250 MG PO TABS: ORAL_TABLET | ORAL | Status: DC

## 2015-10-09 NOTE — Patient Instructions (Signed)

## 2015-10-09 NOTE — Telephone Encounter (Signed)
The patient has decided to proceed to mastectomy. While the one study suggests a 2.3 cm lesion, with the recent MINDACT study added be reluctant to consider neoadjuvant chemotherapy.  She developed an upper respiratory infection after Christmas with low-grade fever last week, persistent cough with thin white sputum production this week. Similar episode last year.  Family history family the end of the month, and likely will postpone surgical intervention until early February.  The patient will contact the office when she would like to proceed with right mastectomy and we'll arrange for a brief office visit ahead at time.

## 2015-10-09 NOTE — Progress Notes (Signed)
Patient: Lori Berger Female    DOB: 04/15/1941   75 y.o.   MRN: [id number] Visit Date: 10/09/2015  Today's Provider: Mar Daring, PA-C   Chief Complaint  Patient presents with  . Cough   Subjective:    Cough This is a new problem. The current episode started 1 to 4 weeks ago. The problem has been gradually worsening. The problem occurs constantly. The cough is productive of sputum (Clear). Associated symptoms include chills, ear congestion, ear pain, nasal congestion, postnasal drip and rhinorrhea. Pertinent negatives include no chest pain, headaches, sore throat, shortness of breath or wheezing. Nothing aggravates the symptoms. She has tried OTC cough suppressant (Mucinex) for the symptoms. The treatment provided no relief.  She was recently diagnosed with breast cancer and is supposed to be scheduling her mastectomy with Dr. Bary Castilla but came down with this upper respiratory infection that started the day after Christmas. She is wanting to try to get treated for this so that she can be well enough to schedule the mastectomy. She does have a history of cancer of the right breast 26 years ago and also cancer of the uterus both of which she was surgically treated for.     Allergies  Allergen Reactions  . Penicillins    Previous Medications   AMLODIPINE (NORVASC) 5 MG TABLET    Take 1 tablet by mouth daily.   CANAGLIFLOZIN-METFORMIN HCL 150-500 MG TABS    Take 1 tablet by mouth 2 (two) times daily.   GLIMEPIRIDE (AMARYL) 4 MG TABLET    Take 1 tablet by mouth daily.   LISINOPRIL (PRINIVIL,ZESTRIL) 20 MG TABLET    Take 1 tablet by mouth daily.   SIMVASTATIN (ZOCOR) 10 MG TABLET    Take 1 tablet by mouth at bedtime.    Review of Systems  Constitutional: Positive for chills.  HENT: Positive for congestion, ear pain, postnasal drip, rhinorrhea and sinus pressure. Negative for sneezing, sore throat, trouble swallowing and voice change.   Eyes: Negative.   Respiratory:  Positive for cough and chest tightness (a little). Negative for shortness of breath and wheezing.   Cardiovascular: Negative for chest pain and palpitations.  Gastrointestinal: Negative for nausea, vomiting and abdominal pain.  Neurological: Negative for dizziness and headaches.  All other systems reviewed and are negative.   Social History  Substance Use Topics  . Smoking status: Never Smoker   . Smokeless tobacco: Never Used  . Alcohol Use: No   Objective:   BP 150/80 mmHg  Pulse 73  Temp(Src) 98.2 F (36.8 C) (Oral)  Resp 17  Wt 199 lb 6.4 oz (90.447 kg)  SpO2 97%  Physical Exam  Constitutional: She appears well-developed and well-nourished. No distress.  HENT:  Head: Normocephalic and atraumatic.  Right Ear: Hearing, tympanic membrane, external ear and ear canal normal.  Left Ear: Hearing, tympanic membrane, external ear and ear canal normal.  Nose: Nose normal. No mucosal edema or rhinorrhea. Right sinus exhibits no maxillary sinus tenderness and no frontal sinus tenderness. Left sinus exhibits no maxillary sinus tenderness and no frontal sinus tenderness.  Mouth/Throat: Uvula is midline, oropharynx is clear and moist and mucous membranes are normal. No oropharyngeal exudate, posterior oropharyngeal edema or posterior oropharyngeal erythema.  Eyes: Conjunctivae are normal. Pupils are equal, round, and reactive to light. Right eye exhibits no discharge. Left eye exhibits no discharge. No scleral icterus.  Neck: Normal range of motion. Neck supple. No tracheal deviation present. No thyromegaly  present.  Cardiovascular: Normal rate, regular rhythm and normal heart sounds.  Exam reveals no gallop and no friction rub.   No murmur heard. Pulmonary/Chest: Effort normal. No stridor. No respiratory distress. She has wheezes in the right lower field. She has rales in the right lower field.  Lymphadenopathy:    She has no cervical adenopathy.  Skin: Skin is warm and dry. She is not  diaphoretic.  Vitals reviewed.       Assessment & Plan:     1. Bronchitis  worsening symptoms that is not responding to over-the-counter treatment with Mucinex. Also recent cancer diagnosis. I will treat with azithromycin as below and a prednisone taper. Advised that she may use the Mucinex for congestion if needed. I also advised that she needs to continue to stay well-hydrated and to get plenty of rest. I did offer her a cough medication but she states she does have some left over from a previous illness for which she took yesterday and this morning that has been helping her cough some. I advised that she may continue to take that. She does have a follow-up with Dr. Rosanna Randy already scheduled for next Tuesday, January 10. She may however call the office if she has any worsening symptoms in the meantime. - azithromycin (ZITHROMAX) 250 MG tablet; Take 2 tablets PO on day one, and one tablet PO daily thereafter until completed.  Dispense: 6 tablet; Refill: 0 - predniSONE (DELTASONE) 10 MG tablet; Take 6 tabs PO on day 1&2, 5 tabs PO on day 3&4, 4 tabs PO on day 5&6, 3 tabs PO on day 7&8, 2 tabs PO on day 9&10, 1 tab PO on day 11&12.  Dispense: 42 tablet; Refill: 0       Mar Daring, PA-C  [facility]

## 2015-10-14 ENCOUNTER — Ambulatory Visit (INDEPENDENT_AMBULATORY_CARE_PROVIDER_SITE_OTHER): Payer: Medicare Other | Admitting: Family Medicine

## 2015-10-14 VITALS — BP 114/72 | HR 72 | Temp 97.7°F | Resp 16 | Ht 64.0 in | Wt 197.0 lb

## 2015-10-14 DIAGNOSIS — Z0000 Encounter for general adult medical examination without abnormal findings: Secondary | ICD-10-CM | POA: Diagnosis not present

## 2015-10-14 NOTE — Progress Notes (Signed)
Patient ID: Lori Berger, female   DOB: September 01, 1941, 75 y.o.   MRN: [id number] Patient: Lori Berger, Female    DOB: 1941/05/14, 75 y.o.   MRN: [id number] Visit Date: 10/14/2015  Today's Provider: Wilhemena Durie, MD   Chief Complaint  Patient presents with  . Annual Exam   Subjective:   Lori Berger is a 75 y.o. female who presents today for her Subsequent Annual Wellness Visit. She feels fairly well. She is currently being treated for a bronchitis and has symptoms associated with that illness.  She reports exercising none. She reports she is sleeping well.  Review of Systems  Constitutional: Negative.   HENT: Positive for congestion, ear pain and hearing loss.   Eyes: Negative.   Respiratory: Positive for cough and wheezing.   Gastrointestinal: Negative.   Endocrine: Negative.   Genitourinary: Negative.   Musculoskeletal: Negative.   Skin: Negative.   Allergic/Immunologic: Negative.   Neurological: Negative.   Hematological: Negative.   Psychiatric/Behavioral: Negative.     Patient Active Problem List   Diagnosis Date Noted  . Breast cancer, female, right 09/26/2015  . Basal cell carcinoma of face 02/07/2015  . Breast CA ([place]) 02/07/2015  . Diabetes mellitus, type 2 ([place]) 02/07/2015  . Dysfunction of eustachian tube 02/07/2015  . 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 Malignant neoplasm of corpus uteri, except isthmus ([place]) 02/07/2015  . Polyp of corpus uteri 02/07/2015  . Essential (primary) hypertension 02/07/2015  . Need for prophylactic hormone replacement therapy (postmenopausal) 02/07/2015  . Decreased potassium in the blood 02/07/2015  . 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 Malaise and fatigue 02/07/2015  . Combined fat and carbohydrate induced hyperlipemia 02/07/2015  . Adiposity 02/07/2015  . Arthritis, degenerative 02/07/2015  . Allergic rhinitis 02/07/2015  . Postmenopausal bleeding 02/07/2015  . Psychosis 02/07/2015   . Inflammation of sacroiliac joint ([place]) 02/07/2015    Social History   Social History  . Marital Status: Widowed    Spouse Name: widowed  . Number of Children: 3  . Years of Education: N/A   Occupational History  . Not on file.   Social History Main Topics  . Smoking status: Never Smoker   . Smokeless tobacco: Never Used  . Alcohol Use: No  . Drug Use: No  . Sexual Activity: No   Other Topics Concern  . Not on file   Social History Narrative    Past Surgical History  Procedure Laterality Date  . Abdominal hysterectomy    . Mastectomy Left     1990    Her family history includes Cancer in her brother and sister; Heart disease in her mother; Stroke in her father. There is no history of Breast cancer.    Outpatient Prescriptions Prior to Visit  Medication Sig Dispense Refill  . amLODipine (NORVASC) 5 MG tablet Take 1 tablet by mouth daily.    . Canagliflozin-Metformin HCl 150-500 MG TABS Take 1 tablet by mouth 2 (two) times daily. 60 tablet 12  . glimepiride (AMARYL) 4 MG tablet Take 1 tablet by mouth daily.    Marland Kitchen lisinopril (PRINIVIL,ZESTRIL) 20 MG tablet Take 1 tablet by mouth daily.    . simvastatin (ZOCOR) 10 MG tablet Take 1 tablet by mouth at bedtime.    Marland Kitchen azithromycin (ZITHROMAX) 250 MG tablet Take 2 tablets PO on day one, and one tablet PO daily thereafter until completed. 6 tablet 0  . predniSONE (DELTASONE) 10 MG tablet Take 6 tabs PO on day  1&2, 5 tabs PO on day 3&4, 4 tabs PO on day 5&6, 3 tabs PO on day 7&8, 2 tabs PO on day 9&10, 1 tab PO on day 11&12. 42 tablet 0   No facility-administered medications prior to visit.    Current Outpatient Prescriptions on File Prior to Visit  Medication Sig Dispense Refill  . amLODipine (NORVASC) 5 MG tablet Take 1 tablet by mouth daily.    . Canagliflozin-Metformin HCl 150-500 MG TABS Take 1 tablet by mouth 2 (two) times daily. 60 tablet 12  . glimepiride (AMARYL) 4 MG tablet Take 1 tablet by mouth daily.    Marland Kitchen  lisinopril (PRINIVIL,ZESTRIL) 20 MG tablet Take 1 tablet by mouth daily.    . simvastatin (ZOCOR) 10 MG tablet Take 1 tablet by mouth at bedtime.     No current facility-administered medications on file prior to visit.  .med  Allergies  Allergen Reactions  . Penicillins     Patient Care Team: Jerrol Banana., MD as PCP - General (Family Medicine) Jerrol Banana., MD (Family Medicine) Robert Bellow, MD (General Surgery)  Objective:   Vitals:  Filed Vitals:   10/14/15 1049  BP: 114/72  Pulse: 72  Temp: 97.7 F (36.5 C)  TempSrc: Oral  Resp: 16  Height: 5\' 4"  (1.626 m)  Weight: 197 lb (89.359 kg)    Physical Exam  Constitutional: She is oriented to person, place, and time. She appears well-developed and well-nourished.  HENT:  Head: Normocephalic and atraumatic.  Right Ear: External ear normal.  Left Ear: External ear normal.  Nose: Nose normal.  Mouth/Throat: Oropharynx is clear and moist.  Eyes: Conjunctivae and EOM are normal. Pupils are equal, round, and reactive to light.  Neck: Normal range of motion. Neck supple.  Cardiovascular: Normal rate, regular rhythm, normal heart sounds and intact distal pulses.   Pulmonary/Chest: Effort normal and breath sounds normal.  Abdominal: Soft. Bowel sounds are normal.  Musculoskeletal: Normal range of motion.  Neurological: She is alert and oriented to person, place, and time.  Skin: Skin is warm and dry.  Psychiatric: She has a normal mood and affect. Her behavior is normal. Judgment and thought content normal.    Activities of Daily Living In your present state of health, do you have any difficulty performing the following activities: 10/14/2015 03/26/2015  Hearing? N Y  Vision? N N  Difficulty concentrating or making decisions? N N  Walking or climbing stairs? N Y  Dressing or bathing? N N  Doing errands, shopping? N N    Fall Risk Assessment Fall Risk  10/14/2015 03/26/2015  Falls in the past year?  Yes No  Number falls in past yr: 1 -  Injury with Fall? No -     Depression Screen PHQ 2/9 Scores 10/14/2015 03/26/2015  PHQ - 2 Score 0 0    Cognitive Testing - 6-CIT    Year: 0 4 points  Month: 0 3 points  Memorize "Lori Berger, [address] [place]"  Time (within 1 hour:) 0 3 points  Count backwards from 20: 0 2 4 points  Name months of year: 0 2 4 points  Repeat Address: 0 2 4 6 8 10  points   Total Score: 4/28  Interpretation : Normal (0-7) Abnormal (8-28)    Assessment & Plan:     Annual Wellness Visit  Reviewed patient's Family Medical History Reviewed and updated list of patient's medical providers Assessment of cognitive impairment was done Assessed patient's functional  ability Established a written schedule for health screening [place] Completed and Reviewed  Exercise Activities and Dietary recommendations Goals    None      Immunization History  Administered Date(s) Administered  . Influenza, High Dose Seasonal PF 06/25/2015  . Pneumococcal Conjugate-13 06/06/2014  . Pneumococcal Polysaccharide-23 06/14/2012  . Tdap 05/31/2011  . Zoster 11/17/2007    Health Maintenance  Topic Date Due  . FOOT EXAM  09/03/1949  . OPHTHALMOLOGY EXAM  09/03/1949  . DEXA SCAN  09/03/2004  . HEMOGLOBIN A1C  12/23/2015  . INFLUENZA VACCINE  05/04/2016  . TETANUS/TDAP  05/30/2021  . ZOSTAVAX  Completed  . PNA vac Low Risk Adult  Completed      Discussed health benefits of physical activity, and encouraged her to engage in regular exercise appropriate for her age and condition.   URI/Bronchitis Improved--f/u 1 week for check on A1C /DM. I have done the exam and reviewed the above chart and it is accurate to the best of my knowledge.  Miguel Aschoff MD [facility] 10/14/2015 10:52  AM  ------------------------------------------------------------------------------------------------------------

## 2015-10-21 ENCOUNTER — Encounter: Payer: Self-pay | Admitting: Family Medicine

## 2015-10-21 ENCOUNTER — Ambulatory Visit (INDEPENDENT_AMBULATORY_CARE_PROVIDER_SITE_OTHER): Payer: Medicare Other | Admitting: Family Medicine

## 2015-10-21 VITALS — BP 104/58 | HR 94 | Temp 97.5°F | Resp 16 | Wt 196.0 lb

## 2015-10-21 DIAGNOSIS — I10 Essential (primary) hypertension: Secondary | ICD-10-CM | POA: Diagnosis not present

## 2015-10-21 DIAGNOSIS — E669 Obesity, unspecified: Secondary | ICD-10-CM | POA: Diagnosis not present

## 2015-10-21 DIAGNOSIS — E7800 Pure hypercholesterolemia, unspecified: Secondary | ICD-10-CM | POA: Diagnosis not present

## 2015-10-21 DIAGNOSIS — N3000 Acute cystitis without hematuria: Secondary | ICD-10-CM

## 2015-10-21 DIAGNOSIS — E119 Type 2 diabetes mellitus without complications: Secondary | ICD-10-CM | POA: Diagnosis not present

## 2015-10-21 DIAGNOSIS — C50911 Malignant neoplasm of unspecified site of right female breast: Secondary | ICD-10-CM

## 2015-10-21 LAB — POCT GLYCOSYLATED HEMOGLOBIN (HGB A1C): Hemoglobin A1C: 8.5

## 2015-10-21 NOTE — Progress Notes (Signed)
Patient ID: Lori Berger, female   DOB: [birth year], 74 y.o.   MRN: [id number]    Subjective:  HPI  Diabetes follow up: Patient has not been checking her sugar much lately. Her last one was around 165. Her readings usually would be 130-132. No hypoglycemic episodes.  Patient is having urinary issues again-pain and pressure in the pelvic area since yesterday. She did take AZO yesterday and symptoms improved. She feels like maybe the Invokamet and is contributing to urinary issues she has started to have. Since the fall this is the third time she is having issues with this.  Lab Results  Component Value Date   HGBA1C 7.4 06/25/2015   Lab Results  Component Value Date   CHOL 186 06/25/2015   HDL 47 06/25/2015   LDLCALC 87 06/25/2015   TRIG 259* 06/25/2015   CHOLHDL 4.0 06/25/2015    Prior to Admission medications   Medication Sig Start Date End Date Taking? Authorizing Provider  amLODipine (NORVASC) 5 MG tablet Take 1 tablet by mouth daily. 10/02/14  Yes Historical Provider, MD  Canagliflozin-Metformin HCl 150-500 MG TABS Take 1 tablet by mouth 2 (two) times daily. 03/26/15  Yes Richard Maceo Pro., MD  glimepiride (AMARYL) 4 MG tablet Take 1 tablet by mouth daily. 10/02/14  Yes Historical Provider, MD  lisinopril (PRINIVIL,ZESTRIL) 20 MG tablet Take 1 tablet by mouth daily. 10/02/14  Yes Historical Provider, MD  simvastatin (ZOCOR) 10 MG tablet Take 1 tablet by mouth at bedtime. 12/23/14  Yes Historical Provider, MD    Patient Active Problem List   Diagnosis Date Noted  . Breast cancer, female, right 09/26/2015  . Basal cell carcinoma of face 02/07/2015  . Breast CA ([place]) 02/07/2015  . Diabetes mellitus, type 2 ([place]) 02/07/2015  . Dysfunction of eustachian tube 02/07/2015  . 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 Malignant neoplasm of corpus uteri, except isthmus ([place]) 02/07/2015  . Polyp of corpus uteri 02/07/2015  . Essential (primary) hypertension  02/07/2015  . Need for prophylactic hormone replacement therapy (postmenopausal) 02/07/2015  . Decreased potassium in the blood 02/07/2015  . 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 Malaise and fatigue 02/07/2015  . Combined fat and carbohydrate induced hyperlipemia 02/07/2015  . Adiposity 02/07/2015  . Arthritis, degenerative 02/07/2015  . Allergic rhinitis 02/07/2015  . Postmenopausal bleeding 02/07/2015  . Psychosis 02/07/2015  . Inflammation of sacroiliac joint ([place]) 02/07/2015    Past Medical History  Diagnosis Date  . Endometrial cancer ([place])   . Breast cancer ([place])     left 1990  . Diabetes mellitus without complication ([place])   . Hypertension   . Hyperlipidemia   . Arthritis     Social History   Social History  . Marital Status: Widowed    Spouse Name: widowed  . Number of Children: 3  . Years of Education: N/A   Occupational History  . Not on file.   Social History Main Topics  . Smoking status: Never Smoker   . Smokeless tobacco: Never Used  . Alcohol Use: No  . Drug Use: No  . Sexual Activity: No   Other Topics Concern  . Not on file   Social History Narrative    Allergies  Allergen Reactions  . Penicillins     Review of Systems  Constitutional: Positive for malaise/fatigue. Negative for fever and chills.  HENT: Positive for congestion.   Respiratory: Negative.   Cardiovascular: Negative.   Gastrointestinal: Negative.   Genitourinary: Positive for  dysuria, urgency and frequency.  Musculoskeletal: Positive for joint pain.  Neurological: Negative for weakness.    Immunization History  Administered Date(s) Administered  . Influenza, High Dose Seasonal PF 06/25/2015  . Pneumococcal Conjugate-13 06/06/2014  . Pneumococcal Polysaccharide-23 06/14/2012  . Tdap 05/31/2011  . Zoster 11/17/2007   Objective:  BP 104/58 mmHg  Pulse 94  Temp(Src) 97.5 F (36.4 C)  Resp 16  Wt 196 lb (88.905 kg)  Physical Exam  Constitutional: She is  oriented to person, place, and time and well-developed, well-nourished, and in no distress.  HENT:  Head: Normocephalic and atraumatic.  Eyes: Conjunctivae are normal. Pupils are equal, round, and reactive to light.  Neck: Normal range of motion. Neck supple.  Cardiovascular: Normal rate, regular rhythm, normal heart sounds and intact distal pulses.   No murmur heard. Pulmonary/Chest: Effort normal and breath sounds normal. No respiratory distress. She has no wheezes.  Musculoskeletal: She exhibits no edema or tenderness.  Neurological: She is alert and oriented to person, place, and time.  Psychiatric: Mood, memory, affect and judgment normal.    Lab Results  Component Value Date   WBC 7.0 11/27/2014   HGB 14.0 11/27/2014   HCT 42 11/27/2014   PLT 264 11/27/2014   GLUCOSE 145* 06/25/2015   CHOL 186 06/25/2015   TRIG 259* 06/25/2015   HDL 47 06/25/2015   LDLCALC 87 06/25/2015   TSH 2.26 11/27/2014   HGBA1C 7.4 06/25/2015    CMP     Component Value Date/Time   NA 142 06/25/2015 1232   K 4.6 06/25/2015 1232   CL 104 06/25/2015 1232   CO2 19 06/25/2015 1232   GLUCOSE 145* 06/25/2015 1232   BUN 18 06/25/2015 1232   CREATININE 0.99 06/25/2015 1232   CREATININE 0.8 11/27/2014   CALCIUM 9.6 06/25/2015 1232   PROT 6.9 06/25/2015 1232   ALBUMIN 4.3 06/25/2015 1232   AST 20 06/25/2015 1232   ALT 14 06/25/2015 1232   ALKPHOS 80 06/25/2015 1232   BILITOT 0.3 06/25/2015 1232   GFRNONAA 56* 06/25/2015 1232   GFRAA 64 06/25/2015 1232    Assessment and Plan :  1. Type 2 diabetes mellitus without complication, without long-term current use of insulin (HCC) A1C 8.5 today. Worse/stable at this time. Discussed with patient going on insulin for a little while so she can get mastectomy done. Patient has had a lot going on including with been diagnosed with breast cancer. Invokamet maybe contributing to urinary issues she is having. Will not change medication at this time and will check  urine culture. Pending results. Patient understands and agrees.  2. Elevated cholesterol  3. Essential (primary) hypertension Stable.  4. Adiposity.  5. Breast cancer Patient wants to get mastectomy done but she just got over URI and possibly has a UTI now. Pending results. Advised patient after getting results to go ahead and call surgeon and try to get this surgery scheduled.   I have done the exam and reviewed the above chart and it is accurate to the best of my knowledge.  Miguel Aschoff MD [facility] 10/21/2015 11:10 AM

## 2015-10-23 LAB — URINE CULTURE: ORGANISM ID, BACTERIA: NO GROWTH

## 2015-10-24 ENCOUNTER — Telehealth: Payer: Self-pay

## 2015-10-24 NOTE — Telephone Encounter (Signed)
Patient advised as directed below. Patient reports she is still having a lot of pressure when she urinates. Patient has a follow up appointment scheduled for Tuesday.

## 2015-10-24 NOTE — Telephone Encounter (Signed)
[id number] 

## 2015-10-24 NOTE — Telephone Encounter (Signed)
No UTI--try cranberry juice and Azo and increase fluids

## 2015-10-24 NOTE — Telephone Encounter (Signed)
-----   Message from Jerrol Banana., MD sent at 10/24/2015  8:25 AM EST ----- No UTI

## 2015-10-27 NOTE — Telephone Encounter (Signed)
Patient advised. She has been taking Azor and cranberry juice. She feels like maybe something going on in vaginal area. Advised patient will look during her visit on Tuesday (tomorrow 10/28/15). Patient did not want to come in sooner.-aa

## 2015-10-28 ENCOUNTER — Telehealth: Payer: Self-pay

## 2015-10-28 ENCOUNTER — Encounter: Payer: Self-pay | Admitting: Family Medicine

## 2015-10-28 ENCOUNTER — Ambulatory Visit (INDEPENDENT_AMBULATORY_CARE_PROVIDER_SITE_OTHER): Payer: Medicare Other | Admitting: Family Medicine

## 2015-10-28 VITALS — BP 108/62 | HR 98 | Temp 98.1°F | Resp 16 | Wt 196.0 lb

## 2015-10-28 DIAGNOSIS — N3090 Cystitis, unspecified without hematuria: Secondary | ICD-10-CM

## 2015-10-28 DIAGNOSIS — C50911 Malignant neoplasm of unspecified site of right female breast: Secondary | ICD-10-CM | POA: Diagnosis not present

## 2015-10-28 NOTE — Telephone Encounter (Signed)
Patient scheduling her surgery for right mastectomy and SLN biopsy. Patient is scheduled for surgery at [facility] on 11/11/15. She would like to pre admit on 11/03/15 around 10 am-request sent to pre admit testing. She will see Dr Bary Castilla here for a short pre op visit on 10/29/15. Patient is aware of dates, time, and instructions.

## 2015-10-28 NOTE — Progress Notes (Signed)
Patient ID: Lori Berger, female   DOB: 15-Jan-1941, 75 y.o.   MRN: [id number]    Subjective:  HPI Pt is here for a 1 week follow up. She was still having vaginal irritation but reports that she took the Azo as directed. She has not had any pain today. She took her last Azo this past Sunday. She would like to know if she can go ahead with her breast surgery.  Prior to Admission medications   Medication Sig Start Date End Date Taking? Authorizing Provider  amLODipine (NORVASC) 5 MG tablet Take 1 tablet by mouth daily. 10/02/14  Yes Historical Provider, MD  Canagliflozin-Metformin HCl 150-500 MG TABS Take 1 tablet by mouth 2 (two) times daily. 03/26/15  Yes Taquilla Downum Maceo Pro., MD  glimepiride (AMARYL) 4 MG tablet Take 1 tablet by mouth daily. 10/02/14  Yes Historical Provider, MD  lisinopril (PRINIVIL,ZESTRIL) 20 MG tablet Take 1 tablet by mouth daily. 10/02/14  Yes Historical Provider, MD  simvastatin (ZOCOR) 10 MG tablet Take 1 tablet by mouth at bedtime. 12/23/14  Yes Historical Provider, MD    Patient Active Problem List   Diagnosis Date Noted  . Breast cancer, female, right 09/26/2015  . Basal cell carcinoma of face 02/07/2015  . Breast CA ([place]) 02/07/2015  . Diabetes mellitus, type 2 ([place]) 02/07/2015  . Dysfunction of eustachian tube 02/07/2015  . 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 Malignant neoplasm of corpus uteri, except isthmus ([place]) 02/07/2015  . Polyp of corpus uteri 02/07/2015  . Essential (primary) hypertension 02/07/2015  . Need for prophylactic hormone replacement therapy (postmenopausal) 02/07/2015  . Decreased potassium in the blood 02/07/2015  . Affective disorder, major ([place]) 02/07/2015  . Malaise and fatigue 02/07/2015  . Combined fat and carbohydrate induced hyperlipemia 02/07/2015  . Adiposity 02/07/2015  . Arthritis, degenerative 02/07/2015  . Allergic rhinitis 02/07/2015  . Postmenopausal bleeding 02/07/2015  . Psychosis 02/07/2015   . Inflammation of sacroiliac joint ([place]) 02/07/2015    Past Medical History  Diagnosis Date  . Endometrial cancer ([place])   . Breast cancer ([place])     left 1990  . Diabetes mellitus without complication ([place])   . Hypertension   . Hyperlipidemia   . Arthritis     Social History   Social History  . Marital Status: Widowed    Spouse Name: widowed  . Number of Children: 3  . Years of Education: N/A   Occupational History  . Not on file.   Social History Main Topics  . Smoking status: Never Smoker   . Smokeless tobacco: Never Used  . Alcohol Use: No  . Drug Use: No  . Sexual Activity: No   Other Topics Concern  . Not on file   Social History Narrative    Allergies  Allergen Reactions  . Penicillins     Review of Systems  Constitutional: Negative.   HENT: Negative.   Eyes: Negative.   Respiratory: Negative.   Cardiovascular: Negative.   Gastrointestinal: Negative.   Genitourinary: Negative.   Musculoskeletal: Negative.   Skin: Negative.   Neurological: Negative.   Endo/Heme/Allergies: Negative.   Psychiatric/Behavioral: Negative.     Immunization History  Administered Date(s) Administered  . Influenza, High Dose Seasonal PF 06/25/2015  . Pneumococcal Conjugate-13 06/06/2014  . Pneumococcal Polysaccharide-23 06/14/2012  . Tdap 05/31/2011  . Zoster 11/17/2007   Objective:  BP 108/62 mmHg  Pulse 98  Temp(Src) 98.1 F (36.7 C) (Oral)  Resp 16  Wt 196 lb (88.905  kg)  Physical Exam  Constitutional: She is oriented to person, place, and time and well-developed, well-nourished, and in no distress.  HENT:  Head: Normocephalic and atraumatic.  Right Ear: External ear normal.  Left Ear: External ear normal.  Nose: Nose normal.  Mouth/Throat: Oropharynx is clear and moist.  Eyes: Conjunctivae and EOM are normal. Pupils are equal, round, and reactive to light.  Neck: Normal range of motion. Neck supple.  Cardiovascular: Normal rate, regular rhythm,  normal heart sounds and intact distal pulses.   Pulmonary/Chest: Effort normal and breath sounds normal.  Abdominal: Soft. Bowel sounds are normal.  Musculoskeletal: Normal range of motion.  Neurological: She is alert and oriented to person, place, and time. She has normal reflexes. Gait normal. GCS score is 15.  Skin: Skin is warm and dry.  Psychiatric: Mood, memory, affect and judgment normal.    Lab Results  Component Value Date   WBC 7.0 11/27/2014   HGB 14.0 11/27/2014   HCT 42 11/27/2014   PLT 264 11/27/2014   GLUCOSE 145* 06/25/2015   CHOL 186 06/25/2015   TRIG 259* 06/25/2015   HDL 47 06/25/2015   LDLCALC 87 06/25/2015   TSH 2.26 11/27/2014   HGBA1C 8.5 10/21/2015    CMP     Component Value Date/Time   NA 142 06/25/2015 1232   K 4.6 06/25/2015 1232   CL 104 06/25/2015 1232   CO2 19 06/25/2015 1232   GLUCOSE 145* 06/25/2015 1232   BUN 18 06/25/2015 1232   CREATININE 0.99 06/25/2015 1232   CREATININE 0.8 11/27/2014   CALCIUM 9.6 06/25/2015 1232   PROT 6.9 06/25/2015 1232   ALBUMIN 4.3 06/25/2015 1232   AST 20 06/25/2015 1232   ALT 14 06/25/2015 1232   ALKPHOS 80 06/25/2015 1232   BILITOT 0.3 06/25/2015 1232   GFRNONAA 56* 06/25/2015 1232   GFRAA 64 06/25/2015 1232    Assessment and Plan :  1. Breast cancer, female, right Pt is ready to be scheduled for mastectomy.  Patient medically clear for surgery. 2. Cystitis Resolved.   Patient was seen and examined by Dr. Miguel Aschoff, and noted scribed by Webb Laws, Marvin MD [facility] 10/28/2015 9:22 AM

## 2015-10-29 ENCOUNTER — Ambulatory Visit (INDEPENDENT_AMBULATORY_CARE_PROVIDER_SITE_OTHER): Payer: Medicare Other | Admitting: General Surgery

## 2015-10-29 ENCOUNTER — Encounter: Payer: Self-pay | Admitting: General Surgery

## 2015-10-29 VITALS — BP 110/68 | HR 76 | Resp 14 | Ht 63.0 in | Wt 187.0 lb

## 2015-10-29 DIAGNOSIS — C50911 Malignant neoplasm of unspecified site of right female breast: Secondary | ICD-10-CM

## 2015-10-29 NOTE — Patient Instructions (Signed)
Patient is scheduled for right mastectomy on 11/11/15

## 2015-10-29 NOTE — Progress Notes (Signed)
Patient ID: Lori Berger, female   DOB: 1941/07/28, 75 y.o.   MRN: [id number]  Chief Complaint  Patient presents with  . Pre-op Exam    right mastectomy     HPI Lori Berger is a 75 y.o. female here today for her pre op right mastectomy scheduled for 11/11/15. Patient states she is doing well.  HPI  Past Medical History  Diagnosis Date  . Endometrial cancer ([place])   . Breast cancer ([place])     left 1990  . Diabetes mellitus without complication ([place])   . Hypertension   . Hyperlipidemia   . Arthritis     Past Surgical History  Procedure Laterality Date  . Abdominal hysterectomy    . Mastectomy Left     1990    Family History  Problem Relation Age of Onset  . Heart disease Mother     Fatal MI age 11  . Stroke Father     fatal age 33  . Cancer Sister     Colon  . Cancer Brother     Died from Kidney cancer  . Breast cancer Neg Hx     Social History Social History  Substance Use Topics  . Smoking status: Never Smoker   . Smokeless tobacco: Never Used  . Alcohol Use: No    Allergies  Allergen Reactions  . Penicillins     Current Outpatient Prescriptions  Medication Sig Dispense Refill  . amLODipine (NORVASC) 5 MG tablet Take 1 tablet by mouth daily.    . Canagliflozin-Metformin HCl 150-500 MG TABS Take 1 tablet by mouth 2 (two) times daily. 60 tablet 12  . glimepiride (AMARYL) 4 MG tablet Take 1 tablet by mouth daily.    Marland Kitchen lisinopril (PRINIVIL,ZESTRIL) 20 MG tablet Take 1 tablet by mouth daily.    . simvastatin (ZOCOR) 10 MG tablet Take 1 tablet by mouth at bedtime.     No current facility-administered medications for this visit.    Review of Systems Review of Systems  Constitutional: Negative.   Respiratory: Negative.   Cardiovascular: Negative.     Blood pressure 110/68, pulse 76, resp. rate 14, height '5\' 3"'  (1.6 m), weight 187 lb (84.823 kg).  Physical Exam Physical Exam  Constitutional: She is oriented to person, place, and time. She appears  well-developed and well-nourished.  Eyes: Conjunctivae are normal.  Neck: Neck supple.  Cardiovascular: Normal rate, regular rhythm and normal heart sounds.   Pulmonary/Chest: Effort normal and breath sounds normal.  Lymphadenopathy:    She has no cervical adenopathy.    She has no axillary adenopathy.  Neurological: She is alert and oriented to person, place, and time.  Skin: Skin is warm.    Data Reviewed IMMUNOHISTOCHEMICAL AND MORPHOMETRIC ANALYSIS PERFORMED MANUALLY Estrogen Receptor: 100%, POSITIVE, STRONG STAINING INTENSITY Progesterone Receptor: 60%, POSITIVE, MODERATE STAINING INTENSITY Proliferation Marker Ki67: 30% REFERENCE RANGE ESTROGEN RECEPTOR NEGATIVE 0% POSITIVE =>1% REFERENCE RANGE PROGESTERONE RECEPTOR NEGATIVE 0% POSITIVE =>1% All controls stained appropriately Claudette Laws MD Pathologist, Electronic Signature ( Signed 10/01/2015) FINAL DIAGNOSIS Diagnosis Breast, right, needle core biopsy, 3:00 o'clock - INVASIVE MAMMARY CARCINOMA. - SEE COMMENT. 1 of 2 FINAL for Solar, Tilda SUE ([id number]) Microscopic Comment The carcinoma appears grade 1 and is favored to be a ductal phenotype. A breast prognostic profile will be performed and the results reported separately. (JBK:gt, 09/26/15) Enid Cutter MD Pathologist, Electronic Signature (Case signed 09/26/2015) Specimen Gross  Assessment    Right breast cancer, clinically stage I.  Plan The patient was aware of her options for breast conservation but has declined.    Patient is scheduled for right mastectomy And sentinel node biopsy is scheduled for 11/11/15.      PCP:  Cranford Mon, Richard L  This information has been scribed by Gaspar Cola CMA.    Robert Bellow 10/30/2015, 5:13 PM

## 2015-10-30 NOTE — H&P (Signed)
HPI  Lori Berger is a 75 y.o. female here today for her pre op right mastectomy scheduled for 11/11/15. Patient states she is doing well.  HPI  Past Medical History   Diagnosis  Date   .  Endometrial cancer ([place])    .  Breast cancer ([place])      left 1990   .  Diabetes mellitus without complication ([place])    .  Hypertension    .  Hyperlipidemia    .  Arthritis     Past Surgical History   Procedure  Laterality  Date   .  Abdominal hysterectomy     .  Mastectomy  Left      1990    Family History   Problem  Relation  Age of Onset   .  Heart disease  Mother      Fatal MI age 49   .  Stroke  Father      fatal age 52   .  Cancer  Sister      Colon   .  Cancer  Brother      Died from Kidney cancer   .  Breast cancer  Neg Hx     Social History  Social History   Substance Use Topics   .  Smoking status:  Never Smoker   .  Smokeless tobacco:  Never Used   .  Alcohol Use:  No    Allergies   Allergen  Reactions   .  Penicillins     Current Outpatient Prescriptions   Medication  Sig  Dispense  Refill   .  amLODipine (NORVASC) 5 MG tablet  Take 1 tablet by mouth daily.     .  Canagliflozin-Metformin HCl 150-500 MG TABS  Take 1 tablet by mouth 2 (two) times daily.  60 tablet  12   .  glimepiride (AMARYL) 4 MG tablet  Take 1 tablet by mouth daily.     Marland Kitchen  lisinopril (PRINIVIL,ZESTRIL) 20 MG tablet  Take 1 tablet by mouth daily.     .  simvastatin (ZOCOR) 10 MG tablet  Take 1 tablet by mouth at bedtime.      No current facility-administered medications for this visit.    Review of Systems  Review of Systems  Constitutional: Negative.  Respiratory: Negative.  Cardiovascular: Negative.   Blood pressure 110/68, pulse 76, resp. rate 14, height '5\' 3"'  (1.6 m), weight 187 lb (84.823 kg).  Physical Exam  Physical Exam  Constitutional: She is oriented to person, place, and time. She appears well-developed and well-nourished.  Eyes: Conjunctivae are normal.  Neck: Neck supple.   Cardiovascular: Normal rate, regular rhythm and normal heart sounds.  Pulmonary/Chest: Effort normal and breath sounds normal.  Lymphadenopathy:  She has no cervical adenopathy.  She has no axillary adenopathy.  Neurological: She is alert and oriented to person, place, and time.  Skin: Skin is warm.   Data Reviewed  IMMUNOHISTOCHEMICAL AND MORPHOMETRIC ANALYSIS PERFORMED MANUALLY  Estrogen Receptor: 100%, POSITIVE, STRONG STAINING INTENSITY  Progesterone Receptor: 60%, POSITIVE, MODERATE STAINING INTENSITY  Proliferation Marker Ki67: 30%  REFERENCE RANGE ESTROGEN RECEPTOR  NEGATIVE 0%  POSITIVE =>1%  REFERENCE RANGE PROGESTERONE RECEPTOR  NEGATIVE 0%  POSITIVE =>1%  All controls stained appropriately  Claudette Laws MD  Pathologist, Electronic Signature  ( Signed 10/01/2015)  FINAL DIAGNOSIS  Diagnosis  Breast, right, needle core biopsy, 3:00 o'clock  - INVASIVE MAMMARY CARCINOMA.  - SEE COMMENT.  1 of 2  FINAL for Lori Berger, Lori Berger [phone])  Microscopic Comment  The carcinoma appears grade 1 and is favored to be a ductal phenotype. A breast prognostic profile will be performed  and the results reported separately. (JBK:gt, 09/26/15)  Enid Cutter MD  Pathologist, Electronic Signature  (Case signed 09/26/2015)  Specimen Gross  Assessment   Right breast cancer, clinically stage I.  Plan  The patient was aware of her options for breast conservation but has declined.  Patient is scheduled for right mastectomy And sentinel node biopsy is scheduled for 11/11/15.   PCP: Cranford Mon, Richard L  This information has been scribed by Gaspar Cola CMA.  Robert Bellow  10/30/2015, 5:13 PM

## 2015-11-03 ENCOUNTER — Telehealth: Payer: Self-pay | Admitting: *Deleted

## 2015-11-03 ENCOUNTER — Encounter
Admission: RE | Admit: 2015-11-03 | Discharge: 2015-11-03 | Disposition: A | Discharge status: Home / Self Care | Payer: Medicare Other | Source: Ambulatory Visit | Attending: General Surgery | Admitting: General Surgery

## 2015-11-03 DIAGNOSIS — I10 Essential (primary) hypertension: Secondary | ICD-10-CM | POA: Diagnosis not present

## 2015-11-03 DIAGNOSIS — Z01812 Encounter for preprocedural laboratory examination: Secondary | ICD-10-CM | POA: Insufficient documentation

## 2015-11-03 DIAGNOSIS — Z01810 Encounter for preprocedural cardiovascular examination: Secondary | ICD-10-CM | POA: Diagnosis not present

## 2015-11-03 HISTORY — DX: Other specified postprocedural states: R11.2

## 2015-11-03 HISTORY — DX: Other specified postprocedural states: Z98.890

## 2015-11-03 LAB — BASIC METABOLIC PANEL
ANION GAP: 12 (ref 5–15)
BUN: 21 mg/dL — AB (ref 6–20)
CHLORIDE: 108 mmol/L (ref 101–111)
CO2: 22 mmol/L (ref 22–32)
Calcium: 9.8 mg/dL (ref 8.9–10.3)
Creatinine, Ser: 0.92 mg/dL (ref 0.44–1.00)
GFR, EST NON AFRICAN AMERICAN: 59 mL/min — AB (ref >60)
GLUCOSE: 241 mg/dL — AB (ref 65–99)
POTASSIUM: 4.3 mmol/L (ref 3.5–5.1)
Sodium: 142 mmol/L (ref 135–145)

## 2015-11-03 LAB — CBC WITH DIFFERENTIAL/PLATELET
BASOS ABS: 0 10*3/uL (ref 0–0.1)
Basophils Relative: 0 %
EOS PCT: 1 %
Eosinophils Absolute: 0.1 10*3/uL (ref 0–0.7)
HEMATOCRIT: 41.4 % (ref 35.0–47.0)
Hemoglobin: 14.1 g/dL (ref 12.0–16.0)
LYMPHS ABS: 1.9 10*3/uL (ref 1.0–3.6)
LYMPHS PCT: 28 %
MCH: 31.1 pg (ref 26.0–34.0)
MCHC: 34 g/dL (ref 32.0–36.0)
MCV: 91.5 fL (ref 80.0–100.0)
MONO ABS: 0.4 10*3/uL (ref 0.2–0.9)
Monocytes Relative: 5 %
NEUTROS ABS: 4.6 10*3/uL (ref 1.4–6.5)
Neutrophils Relative %: 66 %
PLATELETS: 260 10*3/uL (ref 150–440)
RBC: 4.53 MIL/uL (ref 3.80–5.20)
RDW: 14.1 % (ref 11.5–14.5)
WBC: 7 10*3/uL (ref 3.6–11.0)

## 2015-11-03 NOTE — Pre-Procedure Instructions (Signed)
Elevated glucose faxed to Dr Bary Castilla

## 2015-11-03 NOTE — Patient Instructions (Signed)
  Your procedure is scheduled on: Tuesday 11/11/15 Report to Radiology. AT 8:30 AM AS PREVIOUSLY INSTRUCTED To find out your arrival time please call [phone] between 1PM - 3PM on N/A.  Remember: Instructions that are not followed completely may result in serious medical risk, up to and including death, or upon the discretion of your surgeon and anesthesiologist your surgery may need to be rescheduled.    __X__ 1. Do not eat food or drink liquids after midnight. No gum chewing or hard candies.     __X__ 2. No Alcohol for 24 hours before or after surgery.   ____ 3. Bring all medications with you on the day of surgery if instructed.    __X__ 4. Notify your doctor if there is any change in your medical condition     (cold, fever, infections).     Do not wear jewelry, make-up, hairpins, clips or nail polish.  Do not wear lotions, powders, or perfumes.   Do not shave 48 hours prior to surgery. Men may shave face and neck.  Do not bring valuables to the hospital.    [facility] is not responsible for any belongings or valuables.               Contacts, dentures or bridgework may not be worn into surgery.  Leave your suitcase in the car. After surgery it may be brought to your room.  For patients admitted to the hospital, discharge time is determined by your                treatment team.   Patients discharged the day of surgery will not be allowed to drive home.   Please read over the following fact sheets that you were given:   Surgical Site Infection Prevention   __X__ Take these medicines the morning of surgery with A SIP OF WATER:    1. AMLODIPINE  2. LISINOPRIL  3.   4.  5.  6.  ____ Fleet Enema (as directed)   __X__ Use CHG Soap as directed  ____ Use inhalers on the day of surgery  __X__ Stop metformin 2 days prior to surgery YOUR INVOKAMET CONTAINS METFORMIN   ____ Take 1/2 of usual insulin dose the night before surgery and none on the morning of surgery.   ____  Stop Coumadin/Plavix/aspirin on   __X__ Stop Anti-inflammatories on STOP ADVIL TODAY, TYLENOL ONLY FOR ACHES OR PAINS   ____ Stop supplements until after surgery.    ____ Bring C-Pap to the hospital.

## 2015-11-03 NOTE — Telephone Encounter (Signed)
Per Judeen Hammans with Anesthesia, patient needs medical clearance from Dr. Rosanna Randy regarding recent EKG.   Medical clearance was received today from Dr. Rosanna Randy. A copy has been forwarded to the Pre-admission Department and received per Baker Janus.   We will proceed with surgery as scheduled for 11-11-15 at [facility].

## 2015-11-07 ENCOUNTER — Other Ambulatory Visit: Payer: Self-pay | Admitting: Family Medicine

## 2015-11-07 NOTE — Pre-Procedure Instructions (Signed)
Received Medical Clearance for pt upcoming surgery. Pt moderate risk notified Dr Marcello Moores

## 2015-11-11 ENCOUNTER — Encounter
Admission: RE | Disposition: A | Discharge status: Home / Self Care | Payer: Self-pay | Source: Ambulatory Visit | Attending: General Surgery

## 2015-11-11 ENCOUNTER — Encounter: Payer: Self-pay | Admitting: Anesthesiology

## 2015-11-11 ENCOUNTER — Ambulatory Visit
Admission: RE | Admit: 2015-11-11 | Discharge: 2015-11-11 | Disposition: A | Discharge status: Home / Self Care | Payer: Medicare Other | Source: Ambulatory Visit | Attending: General Surgery | Admitting: General Surgery

## 2015-11-11 ENCOUNTER — Ambulatory Visit: Payer: Medicare Other | Admitting: Anesthesiology

## 2015-11-11 DIAGNOSIS — I10 Essential (primary) hypertension: Secondary | ICD-10-CM | POA: Diagnosis not present

## 2015-11-11 DIAGNOSIS — Z823 Family history of stroke: Secondary | ICD-10-CM | POA: Insufficient documentation

## 2015-11-11 DIAGNOSIS — Z880 Allergy status to penicillin: Secondary | ICD-10-CM | POA: Diagnosis not present

## 2015-11-11 DIAGNOSIS — M1990 Unspecified osteoarthritis, unspecified site: Secondary | ICD-10-CM | POA: Insufficient documentation

## 2015-11-11 DIAGNOSIS — Z79899 Other long term (current) drug therapy: Secondary | ICD-10-CM | POA: Diagnosis not present

## 2015-11-11 DIAGNOSIS — Z8249 Family history of ischemic heart disease and other diseases of the circulatory system: Secondary | ICD-10-CM | POA: Diagnosis not present

## 2015-11-11 DIAGNOSIS — Z9012 Acquired absence of left breast and nipple: Secondary | ICD-10-CM | POA: Diagnosis not present

## 2015-11-11 DIAGNOSIS — Z170 Estrogen receptor positive status [ER+]: Secondary | ICD-10-CM | POA: Diagnosis not present

## 2015-11-11 DIAGNOSIS — E785 Hyperlipidemia, unspecified: Secondary | ICD-10-CM | POA: Diagnosis not present

## 2015-11-11 DIAGNOSIS — Z8051 Family history of malignant neoplasm of kidney: Secondary | ICD-10-CM | POA: Diagnosis not present

## 2015-11-11 DIAGNOSIS — E119 Type 2 diabetes mellitus without complications: Secondary | ICD-10-CM | POA: Diagnosis not present

## 2015-11-11 DIAGNOSIS — C50811 Malignant neoplasm of overlapping sites of right female breast: Secondary | ICD-10-CM | POA: Diagnosis not present

## 2015-11-11 DIAGNOSIS — C50911 Malignant neoplasm of unspecified site of right female breast: Secondary | ICD-10-CM | POA: Diagnosis not present

## 2015-11-11 DIAGNOSIS — Z8541 Personal history of malignant neoplasm of cervix uteri: Secondary | ICD-10-CM | POA: Diagnosis not present

## 2015-11-11 DIAGNOSIS — Z800 Family history of malignant neoplasm of digestive organs: Secondary | ICD-10-CM | POA: Diagnosis not present

## 2015-11-11 DIAGNOSIS — Z90710 Acquired absence of both cervix and uterus: Secondary | ICD-10-CM | POA: Insufficient documentation

## 2015-11-11 DIAGNOSIS — C50411 Malignant neoplasm of upper-outer quadrant of right female breast: Secondary | ICD-10-CM | POA: Diagnosis not present

## 2015-11-11 HISTORY — PX: SENTINEL NODE BIOPSY: SHX6608

## 2015-11-11 HISTORY — PX: SIMPLE MASTECTOMY WITH AXILLARY SENTINEL NODE BIOPSY: SHX6098

## 2015-11-11 LAB — GLUCOSE, CAPILLARY
GLUCOSE-CAPILLARY: 231 mg/dL — AB (ref 65–99)
GLUCOSE-CAPILLARY: 193 mg/dL — AB (ref 65–99)

## 2015-11-11 SURGERY — SIMPLE MASTECTOMY
Anesthesia: General | Laterality: Right | Wound class: Clean

## 2015-11-11 MED ORDER — CEFAZOLIN SODIUM-DEXTROSE 2-3 GM-% IV SOLR
INTRAVENOUS | Status: DC
Start: 2015-11-11 — End: 2015-11-11
  Filled 2015-11-11: qty 50

## 2015-11-11 MED ORDER — FENTANYL CITRATE (PF) 100 MCG/2ML IJ SOLN
INTRAMUSCULAR | Status: AC
  Filled 2015-11-11: qty 2

## 2015-11-11 MED ORDER — ACETAMINOPHEN 10 MG/ML IV SOLN
INTRAVENOUS | Status: AC
  Filled 2015-11-11: qty 100

## 2015-11-11 MED ORDER — PROPOFOL 500 MG/50ML IV EMUL
INTRAVENOUS | Status: DC | PRN
  Administered 2015-11-11: 20 ug/kg/min via INTRAVENOUS

## 2015-11-11 MED ORDER — BUPIVACAINE-EPINEPHRINE (PF) 0.5% -1:200000 IJ SOLN
INTRAMUSCULAR | Status: AC
  Filled 2015-11-11: qty 30

## 2015-11-11 MED ORDER — FAMOTIDINE 20 MG PO TABS
ORAL_TABLET | ORAL | Status: AC
  Administered 2015-11-11: 20 mg via ORAL
  Filled 2015-11-11: qty 1

## 2015-11-11 MED ORDER — KETOROLAC TROMETHAMINE 30 MG/ML IJ SOLN
INTRAMUSCULAR | Status: DC | PRN
  Administered 2015-11-11: 15 mg via INTRAVENOUS

## 2015-11-11 MED ORDER — METHYLENE BLUE 0.5 % INJ SOLN
INTRAVENOUS | Status: DC | PRN
  Administered 2015-11-11: 4 mL

## 2015-11-11 MED ORDER — FAMOTIDINE 20 MG PO TABS
20.0000 mg | ORAL_TABLET | Freq: Once | ORAL | Status: AC
  Administered 2015-11-11: 20 mg via ORAL

## 2015-11-11 MED ORDER — TECHNETIUM TC 99M SULFUR COLLOID
1.0700 | Freq: Once | INTRAVENOUS | Status: AC | PRN
  Administered 2015-11-11: 1.07 via INTRAVENOUS

## 2015-11-11 MED ORDER — HYDROCODONE-ACETAMINOPHEN 5-325 MG PO TABS: 1.0000 | ORAL_TABLET | ORAL | Status: DC | PRN

## 2015-11-11 MED ORDER — METHYLENE BLUE 0.5 % INJ SOLN
INTRAVENOUS | Status: AC
  Filled 2015-11-11: qty 10

## 2015-11-11 MED ORDER — ACETAMINOPHEN 10 MG/ML IV SOLN
INTRAVENOUS | Status: DC | PRN
  Administered 2015-11-11: 1000 mg via INTRAVENOUS

## 2015-11-11 MED ORDER — LIDOCAINE HCL (CARDIAC) 20 MG/ML IV SOLN
INTRAVENOUS | Status: DC | PRN
  Administered 2015-11-11: 100 mg via INTRAVENOUS

## 2015-11-11 MED ORDER — CEFAZOLIN SODIUM-DEXTROSE 2-3 GM-% IV SOLR
2.0000 g | INTRAVENOUS | Status: AC
  Administered 2015-11-11: 2 g via INTRAVENOUS

## 2015-11-11 MED ORDER — ONDANSETRON HCL 4 MG/2ML IJ SOLN
INTRAMUSCULAR | Status: DC | PRN
  Administered 2015-11-11: 4 mg via INTRAVENOUS

## 2015-11-11 MED ORDER — SODIUM CHLORIDE 0.9 % IV SOLN
INTRAVENOUS | Status: DC
  Administered 2015-11-11: 10:00:00 via INTRAVENOUS

## 2015-11-11 MED ORDER — PROPOFOL 10 MG/ML IV BOLUS
INTRAVENOUS | Status: DC | PRN
  Administered 2015-11-11: 120 mg via INTRAVENOUS
  Administered 2015-11-11: 30 mg via INTRAVENOUS

## 2015-11-11 MED ORDER — FENTANYL CITRATE (PF) 100 MCG/2ML IJ SOLN
INTRAMUSCULAR | Status: DC | PRN
  Administered 2015-11-11: 25 ug via INTRAVENOUS
  Administered 2015-11-11: 50 ug via INTRAVENOUS
  Administered 2015-11-11 (×2): 25 ug via INTRAVENOUS

## 2015-11-11 MED ORDER — SODIUM CHLORIDE 0.9 % IJ SOLN
INTRAMUSCULAR | Status: AC
  Filled 2015-11-11: qty 10

## 2015-11-11 MED ORDER — PHENYLEPHRINE HCL 10 MG/ML IJ SOLN
INTRAMUSCULAR | Status: DC | PRN
  Administered 2015-11-11 (×3): 100 ug via INTRAVENOUS

## 2015-11-11 MED ORDER — FENTANYL CITRATE (PF) 100 MCG/2ML IJ SOLN
25.0000 ug | INTRAMUSCULAR | Status: AC | PRN
  Administered 2015-11-11 (×6): 25 ug via INTRAVENOUS

## 2015-11-11 MED ORDER — ONDANSETRON HCL 4 MG/2ML IJ SOLN: 4.0000 mg | Freq: Once | INTRAMUSCULAR | Status: DC | PRN

## 2015-11-11 SURGICAL SUPPLY — 61 items
APPLIER CLIP 11 MED OPEN (CLIP)
APPLIER CLIP 13 LRG OPEN (CLIP)
APR CLP LRG 13 20 CLIP (CLIP)
APR CLP MED 11 20 MLT OPN (CLIP)
BANDAGE ACE 6X5 VEL STRL LF (GAUZE/BANDAGES/DRESSINGS) ×2 IMPLANT
BANDAGE ELASTIC 6 LF NS (GAUZE/BANDAGES/DRESSINGS) ×3 IMPLANT
BLADE SURG 15 STRL SS SAFETY (BLADE) ×3 IMPLANT
BNDG CMPR MED 5X6 ELC HKLP NS (GAUZE/BANDAGES/DRESSINGS) ×1
BNDG GAUZE 4.5X4.1 6PLY STRL (MISCELLANEOUS) ×3 IMPLANT
BULB RESERV EVAC DRAIN JP 100C (MISCELLANEOUS) ×3 IMPLANT
CANISTER SUCT 1200ML W/VALVE (MISCELLANEOUS) ×3 IMPLANT
CHLORAPREP W/TINT 26ML (MISCELLANEOUS) ×3 IMPLANT
CLIP APPLIE 11 MED OPEN (CLIP) IMPLANT
CLIP APPLIE 13 LRG OPEN (CLIP) IMPLANT
CLOSURE WOUND 1/2 X4 (GAUZE/BANDAGES/DRESSINGS) ×1
CNTNR SPEC 2.5X3XGRAD LEK (MISCELLANEOUS) ×1
CONT SPEC 4OZ STER OR WHT (MISCELLANEOUS) ×2
CONT SPEC 4OZ STRL OR WHT (MISCELLANEOUS) ×1
CONTAINER SPEC 2.5X3XGRAD LEK (MISCELLANEOUS) ×3 IMPLANT
COVER PROBE FLX POLY STRL (MISCELLANEOUS) ×3 IMPLANT
DEVICE DUBIN SPECIMEN MAMMOGRA (MISCELLANEOUS) IMPLANT
DRAIN CHANNEL JP 15F RND 16 (MISCELLANEOUS) ×3 IMPLANT
DRAPE LAPAROTOMY TRNSV 106X77 (MISCELLANEOUS) ×3 IMPLANT
DRESSING TELFA 4X3 1S ST N-ADH (GAUZE/BANDAGES/DRESSINGS) ×4 IMPLANT
DRSG TELFA 3X8 NADH (GAUZE/BANDAGES/DRESSINGS) ×3 IMPLANT
ELECT CAUTERY BLADE TIP 2.5 (TIP) ×3
ELECT REM PT RETURN 9FT ADLT (ELECTROSURGICAL) ×3
ELECTRODE CAUTERY BLDE TIP 2.5 (TIP) ×1 IMPLANT
ELECTRODE REM PT RTRN 9FT ADLT (ELECTROSURGICAL) ×1 IMPLANT
GAUZE FLUFF 18X24 1PLY STRL (GAUZE/BANDAGES/DRESSINGS) ×3 IMPLANT
GAUZE SPONGE 4X4 12PLY STRL (GAUZE/BANDAGES/DRESSINGS) ×1 IMPLANT
GLOVE BIO SURGEON STRL SZ7.5 (GLOVE) ×13 IMPLANT
GLOVE INDICATOR 8.0 STRL GRN (GLOVE) ×3 IMPLANT
GOWN STRL REUS W/ TWL LRG LVL3 (GOWN DISPOSABLE) ×2 IMPLANT
GOWN STRL REUS W/TWL LRG LVL3 (GOWN DISPOSABLE) ×9
KIT RM TURNOVER STRD PROC AR (KITS) ×3 IMPLANT
LABEL OR SOLS (LABEL) ×3 IMPLANT
NDL SAFETY 18GX1.5 (NEEDLE) ×3 IMPLANT
NDL SAFETY 22GX1.5 (NEEDLE) ×3 IMPLANT
PACK BASIN MINOR ARMC (MISCELLANEOUS) ×3 IMPLANT
PAD DRESSING TELFA 3X8 NADH (GAUZE/BANDAGES/DRESSINGS) ×1 IMPLANT
PIN SAFETY STRL (MISCELLANEOUS) ×3 IMPLANT
SHEARS FOC LG CVD HARMONIC 17C (MISCELLANEOUS) IMPLANT
SLEVE PROBE SENORX GAMMA FIND (MISCELLANEOUS) ×3 IMPLANT
SPONGE LAP 18X18 5 PK (GAUZE/BANDAGES/DRESSINGS) ×3 IMPLANT
STRIP CLOSURE SKIN 1/2X4 (GAUZE/BANDAGES/DRESSINGS) ×3 IMPLANT
SUT ETHILON 3-0 FS-10 30 BLK (SUTURE) ×3
SUT SILK 0 (SUTURE)
SUT SILK 0 30XBRD TIE 6 (SUTURE) ×1 IMPLANT
SUT SILK 3 0 (SUTURE) ×3
SUT SILK 3-0 18XBRD TIE 12 (SUTURE) ×1 IMPLANT
SUT VIC AB 2-0 CT1 27 (SUTURE) ×6
SUT VIC AB 2-0 CT1 TAPERPNT 27 (SUTURE) ×4 IMPLANT
SUT VIC AB 2-0 CT2 27 (SUTURE) ×2 IMPLANT
SUT VIC AB 3-0 SH 27 (SUTURE) ×3
SUT VIC AB 3-0 SH 27X BRD (SUTURE) ×1 IMPLANT
SUT VICRYL+ 3-0 144IN (SUTURE) ×3 IMPLANT
SUTURE EHLN 3-0 FS-10 30 BLK (SUTURE) ×1 IMPLANT
SWABSTK COMLB BENZOIN TINCTURE (MISCELLANEOUS) ×3 IMPLANT
SYRINGE 10CC LL (SYRINGE) ×3 IMPLANT
TAPE TRANSPORE STRL 2 31045 (GAUZE/BANDAGES/DRESSINGS) ×3 IMPLANT

## 2015-11-11 NOTE — Transfer of Care (Signed)
Immediate Anesthesia Transfer of Care Note  Patient: Lori Berger  Procedure(s) Performed: Procedure(s): SIMPLE MASTECTOMY (Right) SENTINEL NODE BIOPSY (Right)  Patient Location: PACU  Anesthesia Type:General  Level of Consciousness: awake, alert , oriented and patient cooperative  Airway & Oxygen Therapy: Patient Spontanous Breathing and Patient connected to nasal cannula oxygen  Post-op Assessment: Report given to RN, Post -op Vital signs reviewed and stable and Patient moving all extremities  Post vital signs: Reviewed and stable  Last Vitals:  Filed Vitals:   11/11/15 0943 11/11/15 1140  BP: 128/71 132/65  Pulse: 81 92  Temp: 36.7 C 36.3 C  Resp: 18     Complications: No apparent anesthesia complications

## 2015-11-11 NOTE — Op Note (Signed)
Preoperative diagnosis: Right breast cancer.  Postoperative diagnosis: Same.  Operative procedure: Right simple mastectomy with sentinel node biopsy.  Operating surgeon: Ollen Bowl, M.D.  Anesthesia: Gen. by LMA.  Estimated blood loss: Less than 30 mL.  Clinical note: This 75 year old woman was recently identified with right breast cancer. She declined breast conservation. She was evident for elective mastectomy and sentinel node biopsy. She received prior the procedure. Technetium was injected by the radiology staff prior to presentation of the operating room.  Operative note: With the patient under adequate general anesthesia Chaidez roll behind the right shoulder a total 4 mL of saline/methylene blue diluted oral to: 1 was injected in the subareolar plexus. The breast chest neck was then prepped with ChloraPrep and draped. Elliptical incision was outlined. Skin was incised sharply and the remaining dissection completed electrocautery. Flaps were elevated to the clavicle superiorly, sternum medially, rectus fascia inferiorly and serratus muscle laterally. A single firm hot, non-blue sentinel node was identified. Because of its texture was sent for touch prep and this was negative for macromastia metastatic disease per the pathologist. The breast was elevated off the underlying pectoralis muscle taking the fascia that muscle with the specimen. Wound was irrigated with sterile water. A Blake drain was brought out through the inferior medial flap. This was anchored in place with 3-0 nylon. The skin flaps were approximately with a running 2-0 Vicryl deep dermal suture in 2 segments. Benzoin Steri-Strips were applied. Telfa, fluff gauze, Kerlix and Ace wrap was applied.  The patient was taken to recovery in stable condition.

## 2015-11-11 NOTE — Anesthesia Procedure Notes (Signed)
Procedure Name: LMA Insertion Date/Time: 11/11/2015 10:10 AM Performed by: Alda Berthold Pre-anesthesia Checklist: Patient identified, Patient being monitored, Timeout performed, Emergency Drugs available and Suction available Patient Re-evaluated:Patient Re-evaluated prior to inductionOxygen Delivery Method: Circle system utilized Preoxygenation: Pre-oxygenation with 100% oxygen Intubation Type: IV induction Ventilation: Mask ventilation without difficulty LMA: LMA inserted LMA Size: 4.0 Tube type: Oral Number of attempts: 1 Placement Confirmation: positive ETCO2 and breath sounds checked- equal and bilateral Tube secured with: Tape Dental Injury: Teeth and Oropharynx as per pre-operative assessment

## 2015-11-11 NOTE — OR Nursing (Signed)
Spoke with Dr. Janeece Agee to start IV in the right upper extremity.

## 2015-11-11 NOTE — H&P (Signed)
No change in clinical status. For right mastectomy and SLN biopsy.

## 2015-11-11 NOTE — Discharge Instructions (Signed)
AMBULATORY SURGERY  °DISCHARGE INSTRUCTIONS ° ° °1) The drugs that you were given will stay in your system until tomorrow so for the next 24 hours you should not: ° °A) Drive an automobile °B) Make any legal decisions °C) Drink any alcoholic beverage ° ° °2) You may resume regular meals tomorrow.  Today it is better to start with liquids and gradually work up to solid foods. ° °You may eat anything you prefer, but it is better to start with liquids, then soup and crackers, and gradually work up to solid foods. ° ° °3) Please notify your doctor immediately if you have any unusual bleeding, trouble breathing, redness and pain at the surgery site, drainage, fever, or pain not relieved by medication. ° °4) Your post-operative visit with Dr.                     °           °     is: Date:                        Time:   ° °Please call to schedule your post-operative visit. ° °5) Additional Instructions: °6)  °

## 2015-11-11 NOTE — Anesthesia Preprocedure Evaluation (Signed)
Anesthesia Evaluation  Patient identified by MRN, date of birth, ID band Patient awake    Reviewed: Allergy & Precautions, NPO status , Patient's Chart, lab work & pertinent test results  History of Anesthesia Complications (+) PONV  Airway Mallampati: II       Dental no notable dental hx.    Pulmonary neg pulmonary ROS,    Pulmonary exam normal        Cardiovascular Exercise Tolerance: Good hypertension, Pt. on medications  Rhythm:Regular Rate:Normal     Neuro/Psych Depression negative neurological ROS     GI/Hepatic negative GI ROS, Neg liver ROS,   Endo/Other  diabetes, Well Controlled, Type 2, Oral Hypoglycemic Agents  Renal/GU negative Renal ROS     Musculoskeletal   Abdominal Normal abdominal exam  (+)   Peds  Hematology   Anesthesia Other Findings   Reproductive/Obstetrics                             Anesthesia Physical Anesthesia Plan  ASA: III  Anesthesia Plan: General   Post-op Pain Management:    Induction: Intravenous  Airway Management Planned: LMA  Additional Equipment:   Intra-op Plan:   Post-operative Plan:   Informed Consent: I have reviewed the patients History and Physical, chart, labs and discussed the procedure including the risks, benefits and alternatives for the proposed anesthesia with the patient or authorized representative who has indicated his/her understanding and acceptance.     Plan Discussed with: CRNA  Anesthesia Plan Comments:         Anesthesia Quick Evaluation

## 2015-11-11 NOTE — Anesthesia Postprocedure Evaluation (Signed)
Anesthesia Post Note  Patient: Lori Berger  Procedure(s) Performed: Procedure(s) (LRB): SIMPLE MASTECTOMY (Right) SENTINEL NODE BIOPSY (Right)  Patient location during evaluation: PACU Anesthesia Type: General Level of consciousness: awake Pain management: pain level controlled Vital Signs Assessment: post-procedure vital signs reviewed and stable Respiratory status: spontaneous breathing Cardiovascular status: stable Anesthetic complications: no    Last Vitals:  Filed Vitals:   11/11/15 1230 11/11/15 1240  BP: 143/74 146/74  Pulse: 87 80  Temp:  36.5 C  Resp: 16 16    Last Pain:  Filed Vitals:   11/11/15 1241  PainSc: 3                  VAN STAVEREN,Mays Paino

## 2015-11-11 NOTE — OR Nursing (Signed)
Dr. Bary Castilla here.  [place] home

## 2015-11-12 ENCOUNTER — Encounter: Payer: Self-pay | Admitting: General Surgery

## 2015-11-13 LAB — SURGICAL PATHOLOGY

## 2015-11-14 ENCOUNTER — Ambulatory Visit (INDEPENDENT_AMBULATORY_CARE_PROVIDER_SITE_OTHER): Payer: Medicare Other | Admitting: General Surgery

## 2015-11-14 ENCOUNTER — Encounter: Payer: Self-pay | Admitting: General Surgery

## 2015-11-14 DIAGNOSIS — C50911 Malignant neoplasm of unspecified site of right female breast: Secondary | ICD-10-CM

## 2015-11-14 NOTE — Progress Notes (Signed)
Patient came in today for a wound check right mastectomy done on 11/11/15.  The wound is clean, rewrapped patient. Follow up as scheduled.

## 2015-11-18 ENCOUNTER — Encounter: Payer: Self-pay | Admitting: General Surgery

## 2015-11-18 ENCOUNTER — Ambulatory Visit (INDEPENDENT_AMBULATORY_CARE_PROVIDER_SITE_OTHER): Payer: Medicare Other | Admitting: General Surgery

## 2015-11-18 VITALS — BP 110/66 | HR 72 | Resp 14 | Ht 60.0 in | Wt 193.0 lb

## 2015-11-18 DIAGNOSIS — C50911 Malignant neoplasm of unspecified site of right female breast: Secondary | ICD-10-CM

## 2015-11-18 NOTE — Progress Notes (Signed)
Patient ID: Lori Berger, female   DOB: 1941/09/13, 75 y.o.   MRN: [id number]  Chief Complaint  Patient presents with  . Routine Post Op    right mastectomy    HPI Lori Berger is a 75 y.o. female here today for her post op right mastectomy done on 11/11/15. Drain sheet present. She is still draining about 30-50 mL per day. Primarily serous. She was accompanied today by her son. She is having minimal pain. No difficulty with drain management.  I personally reviewed the patient's history. HPI  Past Medical History  Diagnosis Date  . Endometrial cancer ([place])   . Breast cancer ([place])     left 1990  . Diabetes mellitus without complication ([place])   . Hypertension   . Hyperlipidemia   . Arthritis   . PONV (postoperative nausea and vomiting)     Past Surgical History  Procedure Laterality Date  . Abdominal hysterectomy    . Mastectomy Left     1990  . Simple mastectomy with axillary sentinel node biopsy Right 11/11/2015    Procedure: SIMPLE MASTECTOMY;  Surgeon: Robert Bellow, MD;  Location: [facility];  Service: General;  Laterality: Right;  . Sentinel node biopsy Right 11/11/2015    Procedure: SENTINEL NODE BIOPSY;  Surgeon: Robert Bellow, MD;  Location: [facility];  Service: General;  Laterality: Right;    Family History  Problem Relation Age of Onset  . Heart disease Mother     Fatal MI age 72  . Stroke Father     fatal age 89  . Cancer Sister     Colon  . Cancer Brother     Died from Kidney cancer  . Breast cancer Neg Hx     Social History Social History  Substance Use Topics  . Smoking status: Never Smoker   . Smokeless tobacco: Never Used  . Alcohol Use: No    Allergies  Allergen Reactions  . Penicillins     Current Outpatient Prescriptions  Medication Sig Dispense Refill  . amLODipine (NORVASC) 5 MG tablet Take 1 tablet by mouth daily.    . Canagliflozin-Metformin HCl 150-500 MG TABS Take 1 tablet by mouth 2 (two) times daily. (Patient taking  differently: Take 1 tablet by mouth daily. ) 60 tablet 12  . glimepiride (AMARYL) 4 MG tablet TAKE 1 TABLET BY MOUTH ONCE A DAY 30 tablet 12  . HYDROcodone-acetaminophen (NORCO) 5-325 MG tablet Take 1-2 tablets by mouth every 4 (four) hours as needed for moderate pain. 30 tablet 0  . Ibuprofen (ADVIL PO) Take by mouth as needed.    Marland Kitchen lisinopril (PRINIVIL,ZESTRIL) 20 MG tablet Take 1 tablet by mouth daily.    . simvastatin (ZOCOR) 10 MG tablet Take 1 tablet by mouth at bedtime.     No current facility-administered medications for this visit.    Review of Systems Review of Systems  Constitutional: Negative.   Respiratory: Negative.   Cardiovascular: Negative.     Blood pressure 110/66, pulse 72, resp. rate 14, height 5' (1.524 m), weight 193 lb (87.544 kg).  Physical Exam Physical Exam  Constitutional: She is oriented to person, place, and time. She appears well-developed and well-nourished.  Pulmonary/Chest:  Right mastectomy site is healing well.  Neurological: She is alert and oriented to person, place, and time.  Skin: Skin is warm and dry.    Data Reviewed Pathology showed microscopic involvement of the deep margin. Sentinel node negative.  Assessment    Stage II  carcinoma the right breast.    Plan    The patient was encouraged to consider evaluation by medical oncology/radiation oncology regarding adjuvant treatment. She reports a 24 years ago when she had her contralateral cancer she decided not to undergo chemotherapy as recommended by Vedia Pereyra., instead making use of tamoxifen which she tolerated well.  She is mass to consider consultation, but she will likely decline.   Patient to return in one week.  PCP:  Cranford Mon, Richard L  This information has been scribed by Karie Fetch [place].    Robert Bellow 11/18/2015, 8:17 PM

## 2015-11-18 NOTE — Patient Instructions (Signed)
Patient to return in one week. 

## 2015-11-19 ENCOUNTER — Other Ambulatory Visit: Payer: Self-pay | Admitting: Family Medicine

## 2015-11-26 ENCOUNTER — Encounter: Payer: Self-pay | Admitting: General Surgery

## 2015-11-26 ENCOUNTER — Ambulatory Visit (INDEPENDENT_AMBULATORY_CARE_PROVIDER_SITE_OTHER): Payer: Medicare Other | Admitting: General Surgery

## 2015-11-26 VITALS — BP 132/80 | HR 86 | Resp 16 | Ht 63.0 in | Wt 193.0 lb

## 2015-11-26 DIAGNOSIS — C50911 Malignant neoplasm of unspecified site of right female breast: Secondary | ICD-10-CM

## 2015-11-26 NOTE — Patient Instructions (Signed)
The patient is aware to call back for any questions or concerns.  

## 2015-11-26 NOTE — Progress Notes (Signed)
Patient ID: Lori Berger, female   DOB: [birth year], 75 y.o.   MRN: [id number]  Chief Complaint  Patient presents with  . Routine Post Op    HPI Lori Berger is a 75 y.o. female.  Here today for her postoperative visit, right mastectomy on 11-11-15. She states she is doing well. She did have to use some pain medication yesterday but that was the only time. She is a little more short of breath since Sunday. She walked out to get the paper she noticed being more short of breath. Since surgery this was the first time she had gone to get her own new . She did notice that the tips of her finger on her right hand were numb this morning, gradually resolving through the day so that now only the fingertips of the second, third and fourth digits are affected.  HPI  Past Medical History  Diagnosis Date  . Endometrial cancer ([facility])   . Breast cancer ([facility])     left 1990  . Diabetes mellitus without complication ([facility])   . Hypertension   . Hyperlipidemia   . Arthritis   . PONV (postoperative nausea and vomiting)     Past Surgical History  Procedure Laterality Date  . Abdominal hysterectomy    . Mastectomy Left     19 90  . Simple mastectomy with axillary sentinel node biopsy Right 11/11/2015    Procedure: SIMPLE MASTECTOMY;  Surgeon: Robert Bellow, MD;  Location: [facility];  Service: General;  Laterality: Right;  . Sentinel node biopsy Right 11/11/2015    Procedure: SENTINEL NODE BIOPSY;  Surgeon: Robert Bellow, MD;  Location: [facility];  Service: General;  Laterality: Right;    Family History  Problem Relation Age of Onset  . Heart disease Mother     Fatal MI age 68  . Stroke Father     fatal age 46  . Cancer Sister     Colon  . Cancer Brother     Died from Kidney cancer  . Breast cancer Neg Hx     Social History Social History  Substance Use Topics  . Smoking status: Never Smoker   . Smokeless tobacco: Never Used  . Alcohol Use: No    Allergies  Allergen Reactions  .  Penicillins     Current Outpatient Prescriptions  Medication Sig Dispense Refill  . amLODipine (NORVASC) 5 MG tablet TAKE 1 TABLET BY MOUTH ONCE A DAY 30 tablet 12  . Canagliflozin-Metformin HCl 150-500 MG TABS Take 1 tablet by mouth 2 (two) times daily. (Patient taking differently: Take 1 tablet by mouth daily. ) 60 tablet 12  . glimepiride (AMARYL) 4 MG tablet TAKE 1 TABLET BY MOUTH ONCE A DAY 30 tablet 12  . HYDROcodone-acetaminophen (NORCO) 5-325 MG tablet Take 1-2 tablets by mouth every 4 (four) hours as needed for moderate pain. 30 tablet 0  . Ibuprofen (ADVIL PO) Take by mouth as needed.    Marland Kitchen lisinopril (PRINIVIL,ZESTRIL) 20 MG tablet TAKE 1 TABLET BY MOUTH ONCE A DAY 30 tablet 12  . simvastatin (ZOCOR) 10 MG tablet Take 1 tablet by mouth at bedtime.     No current facility-administered medications for this visit.    Review of Systems Review of Systems  Constitutional: Negative.   Respiratory: Negative.   Cardiovascular: Negative.     Blood pressure 132/80, pulse 86, resp. rate 16, height 5\' 3"  (1.6 m), weight 193 lb (87.544 kg), SpO2 94 %.  Physical Exam Physical Exam  Constitutional: She is oriented to person, place, and time. She appears well-developed and well-nourished.  Cardiovascular: Normal rate, regular rhythm and normal heart sounds.   No lower extremity edema. Calf circumference on location 10 cm below the tibial tubercle: 38.5 cm bilaterally.  Pulmonary/Chest: Effort normal and breath sounds normal.  Right mastectomy site well healed. Drain removed.  Musculoskeletal:  Good range of motion upper extremity.  Neurological: She is alert and oriented to person, place, and time.  Skin: Skin is warm and dry.  Psychiatric: Her behavior is normal.    Data Reviewed Drain sheet reviewed. Now less than 30 mL per day. Drain removed without incident. Telfa and Tegaderm dressing applied.  The patient's room air oximetry was 94%. She was ambulated around the office for 6  laps, 30 yard each. Oximetry remained stable between 96 and 94. Heart rate increased to 111 by lap 3, peaking at 120. Within 3 minutes of rest heart rate was 85 and oximetry was 98%. The patient exhibited no dyspnea while ambulating.  Assessment    Doing well status post right mastectomy.  Deconditioning, low suspicion for pulmonary embolus.    Plan    The possible development of fluid at the mastectomy site was reviewed.  If the patient notices increasing shortness of breath unrelated to activity or persistent at rest she's been encouraged to call promptly for assessment.  We will send a sample of her tumor for Mammoprint testing to help determine if adjuvant therapy would be of benefit. The patient is leaning against additional treatment besides an antiestrogen. The Mammoprint results may help provide support for, or argument against that position.    Follow up one week. Order mammoprint   PCP:  Cranford Mon, Richard  This information has been scribed by Karie Fetch [place].   Robert Bellow 11/26/2015, 5:07 PM

## 2015-12-01 DIAGNOSIS — C50811 Malignant neoplasm of overlapping sites of right female breast: Secondary | ICD-10-CM | POA: Diagnosis not present

## 2015-12-04 ENCOUNTER — Encounter: Payer: Self-pay | Admitting: General Surgery

## 2015-12-04 ENCOUNTER — Ambulatory Visit (INDEPENDENT_AMBULATORY_CARE_PROVIDER_SITE_OTHER): Payer: Medicare Other | Admitting: General Surgery

## 2015-12-04 VITALS — BP 144/86 | HR 72 | Resp 12 | Wt 195.0 lb

## 2015-12-04 DIAGNOSIS — C50911 Malignant neoplasm of unspecified site of right female breast: Secondary | ICD-10-CM

## 2015-12-04 NOTE — Patient Instructions (Signed)
The patient is aware to call back for any questions or concerns.  

## 2015-12-04 NOTE — Progress Notes (Signed)
Patient ID: Lori Berger, female   DOB: 1941/07/19, 75 y.o.   MRN: [id number]  Chief Complaint  Patient presents with  . Routine Post Op    HPI Lori Berger is a 75 y.o. female.  Here today for postoperative visit, right mastectomy on 11-11-15.She states she has some "sore spots" but she does seem to be better. She states that late in the evening the right chest area feels "tighter". She states her breathing is much better this week.  I person reviewed the patient's history.  HPI  Past Medical History  Diagnosis Date  . Endometrial cancer ([place])   . Breast cancer ([place])     left 1990  . Diabetes mellitus without complication ([place])   . Hypertension   . Hyperlipidemia   . Arthritis   . PONV (postoperative nausea and vomiting)     Past Surgical History  Procedure Laterality Date  . Abdominal hysterectomy    . Mastectomy Left     1990  . Simple mastectomy with axillary sentinel node biopsy Right 11/11/2015    Procedure: SIMPLE MASTECTOMY;  Surgeon: Robert Bellow, MD;  Location: [facility];  Service: General;  Laterality: Right;  . Sentinel node biopsy Right 11/11/2015    Procedure: SENTINEL NODE BIOPSY;  Surgeon: Robert Bellow, MD;  Location: [facility];  Service: General;  Laterality: Right;    Family History  Problem Relation Age of Onset  . Heart disease Mother     Fatal MI age 40  . Stroke Father     fatal age 48  . Cancer Sister     Colon  . Cancer Brother     Died from Kidney cancer  . Breast cancer Neg Hx     Social History Social History  Substance Use Topics  . Smoking status: Never Smoker   . Smokeless tobacco: Never Used  . Alcohol Use: No    Allergies  Allergen Reactions  . Penicillins     Current Outpatient Prescriptions  Medication Sig Dispense Refill  . amLODipine (NORVASC) 5 MG tablet TAKE 1 TABLET BY MOUTH ONCE A DAY 30 tablet 12  . Canagliflozin-Metformin HCl 150-500 MG TABS Take 1 tablet by mouth 2 (two) times daily. (Patient taking  differently: Take 1 tablet by mouth daily. ) 60 tablet 12  . glimepiride (AMARYL) 4 MG tablet TAKE 1 TABLET BY MOUTH ONCE A DAY 30 tablet 12  . Ibuprofen (ADVIL PO) Take by mouth as needed.    Marland Kitchen lisinopril (PRINIVIL,ZESTRIL) 20 MG tablet TAKE 1 TABLET BY MOUTH ONCE A DAY 30 tablet 12  . simvastatin (ZOCOR) 10 MG tablet Take 1 tablet by mouth at bedtime.     No current facility-administered medications for this visit.    Review of Systems Review of Systems  Constitutional: Negative.   Respiratory: Negative.   Cardiovascular: Negative.     Blood pressure 144/86, pulse 72, resp. rate 12, weight 195 lb (88.451 kg).  Physical Exam Physical Exam  Constitutional: She is oriented to person, place, and time. She appears well-developed and well-nourished.  Pulmonary/Chest:  Modest seroma evident status post drain removal last week. After ChloraPrep and 1 mL 1% plain Xylocaine 37 mL aspirated without incident.  Neurological: She is alert and oriented to person, place, and time.  Skin: Skin is warm and dry.  Psychiatric: Her behavior is normal.    Data Reviewed Mammoprint pending.  Assessment    Doing well status post right mastectomy with sentinel node biopsy.  Plan    Reviewed with the patient's daughter in law the operative findings with a T2 tumor and a positive deep margin. If her Mammoprint results per low risk she'll likely be managed only with antiestrogen therapy.     Follow up in 10 days.   PCP:  Cranford Mon, Richard  This information has been scribed by Karie Fetch RNBC.    Robert Bellow 12/05/2015, 4:39 PM

## 2015-12-10 ENCOUNTER — Telehealth: Payer: Self-pay | Admitting: General Surgery

## 2015-12-10 DIAGNOSIS — L821 Other seborrheic keratosis: Secondary | ICD-10-CM | POA: Diagnosis not present

## 2015-12-10 DIAGNOSIS — L820 Inflamed seborrheic keratosis: Secondary | ICD-10-CM | POA: Diagnosis not present

## 2015-12-10 DIAGNOSIS — L578 Other skin changes due to chronic exposure to nonionizing radiation: Secondary | ICD-10-CM | POA: Diagnosis not present

## 2015-12-10 DIAGNOSIS — L570 Actinic keratosis: Secondary | ICD-10-CM | POA: Diagnosis not present

## 2015-12-10 NOTE — Telephone Encounter (Signed)
The patient was notified that her Mammoprint results showed her to be at low risk for recurrent disease. Details to be reviewed at follow-up on March 14.

## 2015-12-16 ENCOUNTER — Ambulatory Visit (INDEPENDENT_AMBULATORY_CARE_PROVIDER_SITE_OTHER): Payer: Medicare Other | Admitting: General Surgery

## 2015-12-16 ENCOUNTER — Ambulatory Visit: Payer: Medicare Other | Admitting: General Surgery

## 2015-12-16 ENCOUNTER — Encounter: Payer: Self-pay | Admitting: General Surgery

## 2015-12-16 VITALS — BP 126/68 | HR 88 | Resp 16 | Ht 63.0 in | Wt 194.0 lb

## 2015-12-16 DIAGNOSIS — IMO0002 Reserved for concepts with insufficient information to code with codable children: Secondary | ICD-10-CM | POA: Insufficient documentation

## 2015-12-16 DIAGNOSIS — T888XXS Other specified complications of surgical and medical care, not elsewhere classified, sequela: Secondary | ICD-10-CM

## 2015-12-16 DIAGNOSIS — T792XXS Traumatic secondary and recurrent hemorrhage and seroma, sequela: Secondary | ICD-10-CM

## 2015-12-16 DIAGNOSIS — IMO0001 Reserved for inherently not codable concepts without codable children: Secondary | ICD-10-CM

## 2015-12-16 DIAGNOSIS — C50911 Malignant neoplasm of unspecified site of right female breast: Secondary | ICD-10-CM

## 2015-12-16 NOTE — Patient Instructions (Signed)
Patient to return in two weeks  

## 2015-12-16 NOTE — Progress Notes (Signed)
Patient ID: Lori Berger, female   DOB: May 26, 1941, 75 y.o.   MRN: [id number]  Chief Complaint  Patient presents with  . Follow-up    right mastectomy    HPI Lori Berger is a 75 y.o. female for postoperative visit, right mastectomy on 11-11-15.She states she is doing better but still sore. Minimal discomfort with shoulder use.  HPI  Past Medical History  Diagnosis Date  . Endometrial cancer ([place])   . Breast cancer ([place])     left 1990  . Diabetes mellitus without complication ([place])   . Hypertension   . Hyperlipidemia   . Arthritis   . PONV (postoperative nausea and vomiting)     Past Surgical History  Procedure Laterality Date  . Abdominal hysterectomy    . Mastectomy Left     1990  . Simple mastectomy with axillary sentinel node biopsy Right 11/11/2015    Procedure: SIMPLE MASTECTOMY;  Surgeon: Robert Bellow, MD;  Location: [facility];  Service: General;  Laterality: Right;  . Sentinel node biopsy Right 11/11/2015    Procedure: SENTINEL NODE BIOPSY;  Surgeon: Robert Bellow, MD;  Location: [facility];  Service: General;  Laterality: Right;    Family History  Problem Relation Age of Onset  . Heart disease Mother     Fatal MI age 77  . Stroke Father     fatal age 73  . Cancer Sister     Colon  . Cancer Brother     Died from Kidney cancer  . Breast cancer Neg Hx     Social History Social History  Substance Use Topics  . Smoking status: Never Smoker   . Smokeless tobacco: Never Used  . Alcohol Use: No    Allergies  Allergen Reactions  . Penicillins     Current Outpatient Prescriptions  Medication Sig Dispense Refill  . amLODipine (NORVASC) 5 MG tablet TAKE 1 TABLET BY MOUTH ONCE A DAY 30 tablet 12  . Canagliflozin-Metformin HCl 150-500 MG TABS Take 1 tablet by mouth 2 (two) times daily. (Patient taking differently: Take 1 tablet by mouth daily. ) 60 tablet 12  . glimepiride (AMARYL) 4 MG tablet TAKE 1 TABLET BY MOUTH ONCE A DAY 30 tablet 12  . Ibuprofen  (ADVIL PO) Take by mouth as needed.    Marland Kitchen lisinopril (PRINIVIL,ZESTRIL) 20 MG tablet TAKE 1 TABLET BY MOUTH ONCE A DAY 30 tablet 12  . simvastatin (ZOCOR) 10 MG tablet Take 1 tablet by mouth at bedtime.     No current facility-administered medications for this visit.    Review of Systems Review of Systems  Constitutional: Negative.   Respiratory: Negative.   Cardiovascular: Negative.     Blood pressure 126/68, pulse 88, resp. rate 16, height 5\' 3"  (1.6 m), weight 194 lb (87.998 kg).  Physical Exam Physical Exam  Constitutional: She is oriented to person, place, and time. She appears well-developed and well-nourished.  Pulmonary/Chest: Effort normal and breath sounds normal.    Neurological: She is alert and oriented to person, place, and time.  Skin: Skin is warm and dry.  Drained 55 ml of fluid      Assessment    Dong well post mastectomy.     Plan    Declined medical oncology assessment. Willing to consider anti-estrogen treatment.  Will discuss at follow up.     Patient to return  In two weeks PCP:  Cranford Mon, Richard L This information has been scribed by Gaspar Cola CMA.  Robert Bellow 12/16/2015, 9:40 PM

## 2015-12-31 ENCOUNTER — Ambulatory Visit (INDEPENDENT_AMBULATORY_CARE_PROVIDER_SITE_OTHER): Payer: Medicare Other | Admitting: General Surgery

## 2015-12-31 ENCOUNTER — Encounter: Payer: Self-pay | Admitting: General Surgery

## 2015-12-31 VITALS — BP 134/72 | HR 74 | Resp 16 | Ht 63.0 in | Wt 195.0 lb

## 2015-12-31 DIAGNOSIS — E2839 Other primary ovarian failure: Secondary | ICD-10-CM

## 2015-12-31 DIAGNOSIS — Z13820 Encounter for screening for osteoporosis: Secondary | ICD-10-CM

## 2015-12-31 DIAGNOSIS — Z853 Personal history of malignant neoplasm of breast: Secondary | ICD-10-CM

## 2015-12-31 DIAGNOSIS — T792XXS Traumatic secondary and recurrent hemorrhage and seroma, sequela: Secondary | ICD-10-CM

## 2015-12-31 DIAGNOSIS — C50911 Malignant neoplasm of unspecified site of right female breast: Secondary | ICD-10-CM

## 2015-12-31 DIAGNOSIS — IMO0001 Reserved for inherently not codable concepts without codable children: Secondary | ICD-10-CM

## 2015-12-31 DIAGNOSIS — T888XXS Other specified complications of surgical and medical care, not elsewhere classified, sequela: Secondary | ICD-10-CM

## 2015-12-31 MED ORDER — LETROZOLE 2.5 MG PO TABS: 2.5000 mg | ORAL_TABLET | Freq: Every day | ORAL | Status: DC

## 2015-12-31 NOTE — Progress Notes (Signed)
Patient ID: Lori Berger, female   DOB: 12-10-1940, 75 y.o.   MRN: [id number]  Chief Complaint  Patient presents with  . Routine Post Op    HPI Lori Berger is a 75 y.o. female.  Here today for postoperative visit, right mastectomy on 11-11-15.She states she is doing better but still sore. Minimal discomfort with shoulder use and states it is improving.  HPI  Past Medical History  Diagnosis Date  . Diabetes mellitus without complication ([place])   . Hypertension   . Hyperlipidemia   . Arthritis   . PONV (postoperative nausea and vomiting)   . Endometrial cancer ([place])   . Breast cancer ([place]) 1990    left 1990  . Breast cancer ([place]) 11/11/2015    Right, T2 (2.4 cm(, N0; positive deep margin, ER: 100%, PR: 60%; Her 2 neu not amplified, Mammoprint: low risk    Past Surgical History  Procedure Laterality Date  . Abdominal hysterectomy    . Mastectomy Left     1990  . Simple mastectomy with axillary sentinel node biopsy Right 11/11/2015    Procedure: SIMPLE MASTECTOMY;  Surgeon: Robert Bellow, MD;  Location: [facility];  Service: General;  Laterality: Right;  . Sentinel node biopsy Right 11/11/2015    Procedure: SENTINEL NODE BIOPSY;  Surgeon: Robert Bellow, MD;  Location: [facility];  Service: General;  Laterality: Right;    Family History  Problem Relation Age of Onset  . Heart disease Mother     Fatal MI age 74  . Stroke Father     fatal age 55  . Cancer Sister     Colon  . Cancer Brother     Died from Kidney cancer  . Breast cancer Neg Hx     Social History Social History  Substance Use Topics  . Smoking status: Never Smoker   . Smokeless tobacco: Never Used  . Alcohol Use: No    Allergies  Allergen Reactions  . Penicillins     Current Outpatient Prescriptions  Medication Sig Dispense Refill  . amLODipine (NORVASC) 5 MG tablet TAKE 1 TABLET BY MOUTH ONCE A DAY 30 tablet 12  . Canagliflozin-Metformin HCl 150-500 MG TABS Take 1 tablet by mouth 2 (two) times  daily. (Patient taking differently: Take 1 tablet by mouth daily. ) 60 tablet 12  . glimepiride (AMARYL) 4 MG tablet TAKE 1 TABLET BY MOUTH ONCE A DAY 30 tablet 12  . Ibuprofen (ADVIL PO) Take by mouth as needed.    Marland Kitchen lisinopril (PRINIVIL,ZESTRIL) 20 MG tablet TAKE 1 TABLET BY MOUTH ONCE A DAY 30 tablet 12  . simvastatin (ZOCOR) 10 MG tablet Take 1 tablet by mouth at bedtime.    Marland Kitchen letrozole (FEMARA) 2.5 MG tablet Take 1 tablet (2.5 mg total) by mouth daily. 30 tablet 12   No current facility-administered medications for this visit.    Review of Systems Review of Systems  Constitutional: Negative.   Respiratory: Negative.   Cardiovascular: Negative.     Blood pressure 134/72, pulse 74, resp. rate 16, height 5\' 3"  (1.6 m), weight 195 lb (88.451 kg).  Physical Exam Physical Exam  Constitutional: She is oriented to person, place, and time. She appears well-developed and well-nourished.  HENT:  Mouth/Throat: Oropharynx is clear and moist.  Eyes: Conjunctivae are normal. No scleral icterus.  Neck: Neck supple.  Cardiovascular: Normal rate, regular rhythm and normal heart sounds.   Pulmonary/Chest: Effort normal and breath sounds normal.  Excellent upper extremity range  of motion. 55 ml aspiration.  Lymphadenopathy:    She has no cervical adenopathy.  Neurological: She is alert and oriented to person, place, and time.  Skin: Skin is warm and dry.  Psychiatric: Her behavior is normal.    Data Reviewed ER/PR positive tumor. Low Mammoprint score.  Assessment    Doing well status post right mastectomy.    Plan    Indications for antiestrogen therapy reviewed. Last bone density is over 2 years and a new study will be scheduled. We will reassess in one month for toleration of her salt as well as assessment for recurrent seroma accumulation.     Follow up one month. Schedule bone density.   PCP:  Cranford Mon, Richard  This information has been scribed by Karie Fetch RN,  BSN,BC.    Robert Bellow 01/01/2016, 2:46 PM

## 2015-12-31 NOTE — Patient Instructions (Addendum)
The patient is aware to call back for any questions or concerns. Bone density

## 2016-01-19 ENCOUNTER — Encounter: Payer: Self-pay | Admitting: General Surgery

## 2016-01-19 ENCOUNTER — Ambulatory Visit
Admission: RE | Admit: 2016-01-19 | Discharge: 2016-01-19 | Disposition: A | Discharge status: Home / Self Care | Payer: Medicare Other | Source: Ambulatory Visit | Attending: General Surgery | Admitting: General Surgery

## 2016-01-19 DIAGNOSIS — M8580 Other specified disorders of bone density and structure, unspecified site: Secondary | ICD-10-CM | POA: Diagnosis not present

## 2016-01-19 DIAGNOSIS — Z13820 Encounter for screening for osteoporosis: Secondary | ICD-10-CM | POA: Insufficient documentation

## 2016-01-19 DIAGNOSIS — E2839 Other primary ovarian failure: Secondary | ICD-10-CM

## 2016-01-19 DIAGNOSIS — M85851 Other specified disorders of bone density and structure, right thigh: Secondary | ICD-10-CM | POA: Diagnosis not present

## 2016-01-19 DIAGNOSIS — Z853 Personal history of malignant neoplasm of breast: Secondary | ICD-10-CM | POA: Diagnosis not present

## 2016-01-19 DIAGNOSIS — C50911 Malignant neoplasm of unspecified site of right female breast: Secondary | ICD-10-CM

## 2016-01-26 ENCOUNTER — Ambulatory Visit (INDEPENDENT_AMBULATORY_CARE_PROVIDER_SITE_OTHER): Payer: Medicare Other | Admitting: General Surgery

## 2016-01-26 ENCOUNTER — Encounter: Payer: Self-pay | Admitting: General Surgery

## 2016-01-26 VITALS — BP 132/68 | HR 74 | Resp 14 | Ht 64.0 in | Wt 196.0 lb

## 2016-01-26 DIAGNOSIS — R55 Syncope and collapse: Secondary | ICD-10-CM | POA: Insufficient documentation

## 2016-01-26 DIAGNOSIS — C50911 Malignant neoplasm of unspecified site of right female breast: Secondary | ICD-10-CM

## 2016-01-26 DIAGNOSIS — M8580 Other specified disorders of bone density and structure, unspecified site: Secondary | ICD-10-CM

## 2016-01-26 NOTE — Patient Instructions (Addendum)
Add Calcium and Vitamin D 600mg  twice a day. Patient to return two months.

## 2016-01-26 NOTE — Progress Notes (Addendum)
Patient ID: Lori Berger, female   DOB: 11/05/1940, 75 y.o.   MRN: [id number]  Chief Complaint  Patient presents with  . Follow-up    mastectomy    HPI Lori Berger is a 75 y.o. female here today for one month follow up for right breast mastectomy. She states she is doing well today. She passed out last week while grocery shopping at [employer]. She states she was dizzy and passed out for a couple of seconds on 01/10/16. She reports the paramedics checked her sugar and it was over 300 and she had low blood pressure 90/56 she refused to go to the hospital that day. No recurrence since that time.  The patient reports she just finished a meal at the [employer] area when she went shopping and subsequently had her transient episode. No recurrence since this occurred 2 weeks ago.  I personally reviewed the patient's history. HPI  Past Medical History  Diagnosis Date  . Diabetes mellitus without complication ([place])   . Hypertension   . Hyperlipidemia   . Arthritis   . PONV (postoperative nausea and vomiting)   . Endometrial cancer ([place])   . Breast cancer ([place]) 1990    left 1990  . Breast cancer ([place]) 11/11/2015    Right, T2 (2.4 cm(, N0; positive deep margin, ER: 100%, PR: 60%; Her 2 neu not amplified, Mammoprint: low risk    Past Surgical History  Procedure Laterality Date  . Abdominal hysterectomy    . Mastectomy Left     1990  . Simple mastectomy with axillary sentinel node biopsy Right 11/11/2015    Procedure: SIMPLE MASTECTOMY;  Surgeon: Robert Bellow, MD;  Location: [facility] ORS;  Service: General;  Laterality: Right;  . Sentinel node biopsy Right 11/11/2015    Procedure: SENTINEL NODE BIOPSY;  Surgeon: Robert Bellow, MD;  Location: [facility] ORS;  Service: General;  Laterality: Right;    Family History  Problem Relation Age of Onset  . Heart disease Mother     Fatal MI age 29  . Stroke Father     fatal age 58  . Cancer Sister     Colon  . Cancer Brother     Died from Kidney  cancer  . Breast cancer Neg Hx     Social History Social History  Substance Use Topics  . Smoking status: Never Smoker   . Smokeless tobacco: Never Used  . Alcohol Use: No    Allergies  Allergen Reactions  . Penicillins     Current Outpatient Prescriptions  Medication Sig Dispense Refill  . amLODipine (NORVASC) 5 MG tablet TAKE 1 TABLET BY MOUTH ONCE A DAY 30 tablet 12  . Canagliflozin-Metformin HCl 150-500 MG TABS Take 1 tablet by mouth 2 (two) times daily. (Patient taking differently: Take 1 tablet by mouth daily. ) 60 tablet 12  . glimepiride (AMARYL) 4 MG tablet TAKE 1 TABLET BY MOUTH ONCE A DAY 30 tablet 12  . Ibuprofen (ADVIL PO) Take by mouth as needed.    Marland Kitchen letrozole (FEMARA) 2.5 MG tablet Take 1 tablet (2.5 mg total) by mouth daily. 30 tablet 12  . lisinopril (PRINIVIL,ZESTRIL) 20 MG tablet TAKE 1 TABLET BY MOUTH ONCE A DAY 30 tablet 12  . simvastatin (ZOCOR) 10 MG tablet Take 1 tablet by mouth at bedtime.     No current facility-administered medications for this visit.    Review of Systems Review of Systems  Constitutional: Negative.   Respiratory: Negative.  Cardiovascular: Negative.     Blood pressure 132/68, pulse 74, resp. rate 14, height 5\' 4"  (1.626 m), weight 196 lb (88.905 kg).  Physical Exam Physical Exam  Constitutional: She is oriented to person, place, and time. She appears well-developed and well-nourished.  Eyes: Conjunctivae are normal. No scleral icterus.  Cardiovascular: Normal rate, regular rhythm and normal heart sounds.   Pulmonary/Chest: Effort normal and breath sounds normal.  Musculoskeletal: Normal range of motion.  Neurological: She is alert and oriented to person, place, and time. She displays normal reflexes. No cranial nerve deficit. She exhibits normal muscle tone. Coordination normal.  Skin: Skin is warm and dry.  Psychiatric: She has a normal mood and affect. Her behavior is normal. Judgment and thought content normal.     Data Reviewed [place] 25 mL seroma from the right mastectomy site drained after ChloraPrep palpation. Well tolerated.  Bone density dated 01/19/2016 showed osteopenia.  Assessment    Doing well status post mastectomy.  , No evidence of ongoing neurologic process.    Plan    Patient was encouraged to report any further episodes of dizziness or lightheadedness.   Bone density results reviewed. Need for calcium supplementation discussed.     Add Calcium and Vitamin D 600mg  tablet twice a day. Follow up in 2 months.   PCP: Dr. Juliette Mangle, [place] 01/26/2016, 8:38 PM

## 2016-02-05 ENCOUNTER — Other Ambulatory Visit: Payer: Self-pay | Admitting: Family Medicine

## 2016-03-23 ENCOUNTER — Encounter: Payer: Self-pay | Admitting: *Deleted

## 2016-03-29 ENCOUNTER — Ambulatory Visit (INDEPENDENT_AMBULATORY_CARE_PROVIDER_SITE_OTHER): Payer: Medicare Other | Admitting: General Surgery

## 2016-03-29 ENCOUNTER — Encounter: Payer: Self-pay | Admitting: General Surgery

## 2016-03-29 VITALS — BP 126/86 | HR 80 | Resp 12 | Ht 64.0 in | Wt 192.0 lb

## 2016-03-29 DIAGNOSIS — R634 Abnormal weight loss: Secondary | ICD-10-CM

## 2016-03-29 DIAGNOSIS — C50911 Malignant neoplasm of unspecified site of right female breast: Secondary | ICD-10-CM

## 2016-03-29 NOTE — Progress Notes (Signed)
Patient ID: Lori Berger, female   DOB: 09/24/1941, 76 y.o.   MRN: [id number]  Chief Complaint  Patient presents with  . Follow-up    HPI Lori Berger is a 75 y.o. female.  Here today for her follow up breast cancer. She states she is doing well, she has lost some weight, she states her appetite is not as good as it once was. She feels full quicker. She has lost 4 pounds since April. Bowels are regular, denies pain, nausea or vomiting.    I personally reviewed the patient's history. HPI  Past Medical History  Diagnosis Date  . Diabetes mellitus without complication ([place])   . Hypertension   . Hyperlipidemia   . Arthritis   . PONV (postoperative nausea and vomiting)   . Endometrial cancer ([place])   . Breast cancer ([place]) 1990    left 1990  . Breast cancer ([place]) 11/11/2015    Right, T2 (2.4 cm(, N0; positive deep margin, ER: 100%, PR: 60%; Her 2 neu not amplified, Mammoprint: low risk    Past Surgical History  Procedure Laterality Date  . Abdominal hysterectomy    . Mastectomy Left     1990  . Simple mastectomy with axillary sentinel node biopsy Right 11/11/2015    Procedure: SIMPLE MASTECTOMY;  Surgeon: Robert Bellow, MD;  Location: [facility] OR[facility];  Service: General;  Laterality: Right;  . Sentinel node biopsy Right 11/11/2015    Procedure: SENTINEL NODE BIOPSY;  Surgeon: Robert Bellow, MD;  Location: [facility] ORS;  Service: General;  Laterality: Right;    Family History  Problem Relation Age of Onset  . Heart disease Mother     Fatal MI age 62  . Stroke Father     fatal age 80  . Cancer Sister     Colon  . Cancer Brother     Died from Kidney cancer  . Breast cancer Neg Hx     Social History Social History  Substance Use Topics  . Smoking status: Never Smoker   . Smokeless tobacco: Never Used  . Alcohol Use: No    Allergies  Allergen Reactions  . Penicillins     Current Outpatient Prescriptions  Medication Sig Dispense Refill  . amLODipine (NORVASC) 5 MG  tablet TAKE 1 TABLET BY MOUTH ONCE A DAY 30 tablet 12  . Canagliflozin-Metformin HCl 150-500 MG TABS Take 1 tablet by mouth 2 (two) times daily. (Patient taking differently: Take 1 tablet by mouth daily. ) 60 tablet 12  . glimepiride (AMARYL) 4 MG tablet TAKE 1 TABLET BY MOUTH ONCE A DAY 30 tablet 12  . Ibuprofen (ADVIL PO) Take by mouth as needed.    Marland Kitchen letrozole (FEMARA) 2.5 MG tablet Take 1 tablet (2.5 mg total) by mouth daily. 30 tablet 12  . lisinopril (PRINIVIL,ZESTRIL) 20 MG tablet TAKE 1 TABLET BY MOUTH ONCE A DAY 30 tablet 12  . simvastatin (ZOCOR) 10 MG tablet TAKE 1 TABLET BY MOUTH EVERY NIGHT AT BEDTIME 30 tablet 12   No current facility-administered medications for this visit.    Review of Systems Review of Systems  Constitutional: Positive for appetite change.  Respiratory: Negative.   Cardiovascular: Negative.     Blood pressure 126/86, pulse 80, resp. rate 12, height 5\' 4"  (1.626 m), weight 192 lb (87.091 kg).  Physical Exam Physical Exam  Constitutional: She is oriented to person, place, and time. She appears well-developed and well-nourished.  HENT:  Mouth/Throat: Oropharynx is clear and moist.  Eyes:  Conjunctivae are normal. No scleral icterus.  Neck: Neck supple.  Cardiovascular: Normal rate, regular rhythm and normal heart sounds.   Pulses:      Femoral pulses are 2+ on the right side, and 2+ on the left side.      Dorsalis pedis pulses are 2+ on the right side, and 2+ on the left side.       Posterior tibial pulses are 2+ on the right side, and 2+ on the left side.  No lower leg edema  Pulmonary/Chest: Effort normal and breath sounds normal.  Left and Right mastectomy sites well healed.  Abdominal: Soft. Normal appearance and bowel sounds are normal. There is no tenderness. A hernia is present.    Midline hernia below umbilicus.  Lymphadenopathy:    She has no cervical adenopathy.    She has no axillary adenopathy.  Neurological: She is alert and  oriented to person, place, and time.  Skin: Skin is warm and dry.  Psychiatric: Her behavior is normal.       Assessment    No evidence of recurrent breast cancer.  Moderate weight loss with her report of early satiety.    Plan    The allergy the patient's early satiety is unclear. She reports the lower midline abdominal hernia is unchanged over many years and is asymptomatic. While certainly a good candidate for weight loss, she's been encouraged to be sure to maintain adequate dietary intake. At this time she reports no symptoms to suggest obstruction, rather more a loss of her regular appetite.    Follow up in 3 months.  PCP:  Cranford Mon, Richard  This information has been scribed by Karie Fetch RN, BSN,BC.   Robert Bellow 03/30/2016, 7:05 PM

## 2016-03-29 NOTE — Patient Instructions (Signed)
The patient is aware to call back for any questions or concerns.  

## 2016-03-30 DIAGNOSIS — R634 Abnormal weight loss: Secondary | ICD-10-CM | POA: Insufficient documentation

## 2016-03-30 DIAGNOSIS — C50911 Malignant neoplasm of unspecified site of right female breast: Secondary | ICD-10-CM | POA: Insufficient documentation

## 2016-04-21 ENCOUNTER — Other Ambulatory Visit: Payer: Self-pay | Admitting: Family Medicine

## 2016-04-28 ENCOUNTER — Encounter: Payer: Self-pay | Admitting: *Deleted

## 2016-06-14 DIAGNOSIS — L578 Other skin changes due to chronic exposure to nonionizing radiation: Secondary | ICD-10-CM | POA: Diagnosis not present

## 2016-06-14 DIAGNOSIS — L821 Other seborrheic keratosis: Secondary | ICD-10-CM | POA: Diagnosis not present

## 2016-06-14 DIAGNOSIS — L570 Actinic keratosis: Secondary | ICD-10-CM | POA: Diagnosis not present

## 2016-06-14 DIAGNOSIS — L820 Inflamed seborrheic keratosis: Secondary | ICD-10-CM | POA: Diagnosis not present

## 2016-06-29 ENCOUNTER — Ambulatory Visit (INDEPENDENT_AMBULATORY_CARE_PROVIDER_SITE_OTHER): Payer: Medicare Other | Admitting: General Surgery

## 2016-06-29 ENCOUNTER — Encounter: Payer: Self-pay | Admitting: General Surgery

## 2016-06-29 VITALS — BP 124/76 | HR 92 | Resp 14 | Ht 64.0 in | Wt 192.0 lb

## 2016-06-29 DIAGNOSIS — C50911 Malignant neoplasm of unspecified site of right female breast: Secondary | ICD-10-CM

## 2016-06-29 DIAGNOSIS — Z853 Personal history of malignant neoplasm of breast: Secondary | ICD-10-CM

## 2016-06-29 NOTE — Progress Notes (Signed)
Patient ID: Lori Berger, female   DOB: 10-16-1940, 75 y.o.   MRN: [id number]  Chief Complaint  Patient presents with  . Follow-up    HPI Lori Berger is a 75 y.o. female.  Here for follow up right breast cancer with a history of left breast cancer. No new breast issues.  The patient reports she's tolerating her letrozole therapy well.  HPI  Past Medical History:  Diagnosis Date  . Arthritis   . Breast cancer ([place]) 1990   left 1990  . Breast cancer ([place]) 11/11/2015   Right, T2 (2.4 cm(, N0; positive deep margin, ER: 100%, PR: 60%; Her 2 neu not amplified, Mammoprint: low risk  . Diabetes mellitus without complication ([place])   . Endometrial cancer ([place])   . Hyperlipidemia   . Hypertension   . PONV (postoperative nausea and vomiting)     Past Surgical History:  Procedure Laterality Date  . ABDOMINAL HYSTERECTOMY    . MASTECTOMY Left    1990  . SENTINEL NODE BIOPSY Right 11/11/2015   Procedure: SENTINEL NODE BIOPSY;  Surgeon: Robert Bellow, MD;  Location: [facility];  Service: General;  Laterality: Right;  . SIMPLE MASTECTOMY WITH AXILLARY SENTINEL NODE BIOPSY Right 11/11/2015   Procedure: SIMPLE MASTECTOMY;  Surgeon: Robert Bellow, MD;  Location: [facility];  Service: General;  Laterality: Right;    Family History  Problem Relation Age of Onset  . Heart disease Mother     Fatal MI age 13  . Stroke Father     fatal age 48  . Cancer Sister     Colon  . Cancer Brother     Died from Kidney cancer  . Breast cancer Neg Hx     Social History Social History  Substance Use Topics  . Smoking status: Never Smoker  . Smokeless tobacco: Never Used  . Alcohol use No    Allergies  Allergen Reactions  . Penicillins     Current Outpatient Prescriptions  Medication Sig Dispense Refill  . amLODipine (NORVASC) 5 MG tablet TAKE 1 TABLET BY MOUTH ONCE A DAY 30 tablet 12  . glimepiride (AMARYL) 4 MG tablet TAKE 1 TABLET BY MOUTH ONCE A DAY 30 tablet 12  . Ibuprofen (ADVIL  PO) Take by mouth as needed.    . INVOKAMET 150-500 MG TABS TAKE 1 TABLET BY MOUTH TWICE A DAY 60 tablet 11  . letrozole (FEMARA) 2.5 MG tablet Take 1 tablet (2.5 mg total) by mouth daily. 30 tablet 12  . lisinopril (PRINIVIL,ZESTRIL) 20 MG tablet TAKE 1 TABLET BY MOUTH ONCE A DAY 30 tablet 12  . simvastatin (ZOCOR) 10 MG tablet TAKE 1 TABLET BY MOUTH EVERY NIGHT AT BEDTIME 30 tablet 12   No current facility-administered medications for this visit.     Review of Systems Review of Systems  Constitutional: Negative.   Respiratory: Negative.   Cardiovascular: Negative.     Blood pressure 124/76, pulse 92, resp. rate 14, height 5\' 4"  (1.626 m), weight 192 lb (87.1 kg), SpO2 97 %.  Physical Exam Physical Exam  Constitutional: She is oriented to person, place, and time. She appears well-developed and well-nourished.  HENT:  Mouth/Throat: Oropharynx is clear and moist.  Eyes: Conjunctivae are normal. No scleral icterus.  Neck: Neck supple.  Cardiovascular: Normal rate, regular rhythm and normal heart sounds.   Pulmonary/Chest: Effort normal and breath sounds normal. Right breast exhibits no inverted nipple, no mass, no nipple discharge, no skin change and no tenderness.  Left breast exhibits no inverted nipple, no mass, no nipple discharge, no skin change and no tenderness.    Lymphadenopathy:    She has no cervical adenopathy.    She has no axillary adenopathy.  Neurological: She is alert and oriented to person, place, and time.  Skin: Skin is warm and dry.  Psychiatric: Her behavior is normal.       Assessment    No evidence of recurrent breast cancer.    Plan         Follow up in 4 months.  This information has been scribed by Karie Fetch RN, BSN,BC.  Robert Bellow 06/29/2016, 10:29 PM

## 2016-06-29 NOTE — Patient Instructions (Signed)
The patient is aware to call back for any questions or concerns.  

## 2016-10-14 ENCOUNTER — Ambulatory Visit (INDEPENDENT_AMBULATORY_CARE_PROVIDER_SITE_OTHER): Payer: Medicare Other | Admitting: Family Medicine

## 2016-10-14 ENCOUNTER — Ambulatory Visit (INDEPENDENT_AMBULATORY_CARE_PROVIDER_SITE_OTHER): Payer: Medicare Other

## 2016-10-14 VITALS — BP 122/76 | HR 96 | Temp 97.9°F | Wt 195.0 lb

## 2016-10-14 VITALS — BP 122/76 | HR 96 | Temp 97.9°F | Ht 64.0 in | Wt 195.2 lb

## 2016-10-14 DIAGNOSIS — E119 Type 2 diabetes mellitus without complications: Secondary | ICD-10-CM | POA: Diagnosis not present

## 2016-10-14 DIAGNOSIS — Z23 Encounter for immunization: Secondary | ICD-10-CM | POA: Diagnosis not present

## 2016-10-14 DIAGNOSIS — Z0000 Encounter for general adult medical examination without abnormal findings: Secondary | ICD-10-CM | POA: Diagnosis not present

## 2016-10-14 DIAGNOSIS — R55 Syncope and collapse: Secondary | ICD-10-CM | POA: Diagnosis not present

## 2016-10-14 DIAGNOSIS — E782 Mixed hyperlipidemia: Secondary | ICD-10-CM

## 2016-10-14 DIAGNOSIS — I10 Essential (primary) hypertension: Secondary | ICD-10-CM | POA: Diagnosis not present

## 2016-10-14 NOTE — Progress Notes (Signed)
Subjective:   Lori Berger is a 76 y.o. female who presents for Medicare Annual (Subsequent) preventive examination.  Review of Systems:  N/A  Cardiac Risk Factors include: advanced age (>3men, >48 women);diabetes mellitus;dyslipidemia;hypertension;obesity (BMI >30kg/m2)     Objective:     Vitals: BP 122/76   Pulse 96   Temp 97.9 F (36.6 C) (Oral)   Ht 5\' 4"  (1.626 m)   Wt 195 lb 3.2 oz (88.5 kg)   BMI 33.51 kg/m   Body mass index is 33.51 kg/m.   Tobacco History  Smoking Status  . Never Smoker  Smokeless Tobacco  . Never Used     Counseling given: Not Answered   Past Medical History:  Diagnosis Date  . Arthritis   . BCC (basal cell carcinoma)   . Breast cancer ([place]) 1990   left 1990  . Breast cancer ([place]) 11/11/2015   Right, T2 (2.4 cm(, N0; positive deep margin, ER: 100%, PR: 60%; Her 2 neu not amplified, Mammoprint: low risk  . Diabetes mellitus without complication ([place])   . Endometrial cancer ([place])   . Hyperlipidemia   . Hypertension   . Major depressive disorder   . PONV (postoperative nausea and vomiting)    Past Surgical History:  Procedure Laterality Date  . ABDOMINAL HYSTERECTOMY    . MASTECTOMY Left    1990  . SENTINEL NODE BIOPSY Right 11/11/2015   Procedure: SENTINEL NODE BIOPSY;  Surgeon: Robert Bellow, MD;  Location: [facility];  Service: General;  Laterality: Right;  . SIMPLE MASTECTOMY WITH AXILLARY SENTINEL NODE BIOPSY Right 11/11/2015   Procedure: SIMPLE MASTECTOMY;  Surgeon: Robert Bellow, MD;  Location: [facility];  Service: General;  Laterality: Right;   Family History  Problem Relation Age of Onset  . Heart disease Mother     Fatal MI age 58  . Stroke Father     fatal age 15  . Cancer Sister     Colon  . Cancer Brother     Died from Kidney cancer  . Breast cancer Neg Hx    History  Sexual Activity  . Sexual activity: No    Outpatient Encounter Prescriptions as of 10/14/2016  Medication Sig  . amLODipine  (NORVASC) 5 MG tablet TAKE 1 TABLET BY MOUTH ONCE A DAY  . Calcium Carb-Cholecalciferol (CALCIUM 600+D) 600-800 MG-UNIT TABS Take 1 tablet by mouth daily.  Marland Kitchen glimepiride (AMARYL) 4 MG tablet TAKE 1 TABLET BY MOUTH ONCE A DAY  . Ibuprofen (ADVIL PO) Take by mouth as needed.  . INVOKAMET 150-500 MG TABS TAKE 1 TABLET BY MOUTH TWICE A DAY  . letrozole (FEMARA) 2.5 MG tablet Take 1 tablet (2.5 mg total) by mouth daily.  Marland Kitchen lisinopril (PRINIVIL,ZESTRIL) 20 MG tablet TAKE 1 TABLET BY MOUTH ONCE A DAY  . simvastatin (ZOCOR) 10 MG tablet TAKE 1 TABLET BY MOUTH EVERY NIGHT AT BEDTIME (Patient not taking: Reported on 10/14/2016)   No facility-administered encounter medications on file as of 10/14/2016.     Activities of Daily Living In your present state of health, do you have any difficulty performing the following activities: 10/14/2016 11/03/2015  Hearing? Y N  Vision? N N  Difficulty concentrating or making decisions? N N  Walking or climbing stairs? N N  Dressing or bathing? N N  Doing errands, shopping? N N  Preparing Food and eating ? N -  Using the Toilet? N -  In the past six months, have you accidently leaked urine? N -  Do you have problems with loss of bowel control? N -  Managing your Medications? N -  Managing your Finances? N -  Housekeeping or managing your Housekeeping? N -  Some recent data might be hidden    Patient Care Team: Jerrol Banana., MD as PCP - General (Family Medicine) Robert Bellow, MD (General Surgery)    Assessment:      Exercise Activities and Dietary recommendations Current Exercise Habits: The patient does not participate in regular exercise at present, Exercise limited by: Other - see comments (arthritis pain)  Goals    None     Fall Risk Fall Risk  10/14/2016 10/14/2015 03/26/2015  Falls in the past year? Yes Yes No  Number falls in past yr: 1 1 -  Injury with Fall? No No -  Risk for fall due to : History of fall(s);Other (Comment) - -    Risk for fall due to (comments): pt gets dizzy spells - -   Depression Screen PHQ 2/9 Scores 10/14/2016 10/14/2015 03/26/2015  PHQ - 2 Score 0 0 0     Cognitive Function     6CIT Screen 10/14/2016  What Year? 0 points  What month? 0 points  What time? 0 points  Count back from 20 0 points  Months in reverse 0 points  Repeat phrase 2 points  Total Score 2    Immunization History  Administered Date(s) Administered  . Influenza, High Dose Seasonal PF 06/25/2015, 10/14/2016  . Pneumococcal Conjugate-13 06/06/2014  . Pneumococcal Polysaccharide-23 06/14/2012  . Tdap 05/31/2011  . Zoster 11/17/2007   Screening Tests Health Maintenance  Topic Date Due  . HEMOGLOBIN A1C  04/19/2016  . FOOT EXAM  10/13/2016  . OPHTHALMOLOGY EXAM  03/04/2017 (Originally 09/03/1951)  . TETANUS/TDAP  05/30/2021  . INFLUENZA VACCINE  Completed  . DEXA SCAN  Completed  . ZOSTAVAX  Completed  . PNA vac Low Risk Adult  Completed      Plan:  I have personally reviewed and addressed the Medicare Annual Wellness questionnaire and have noted the following in the patient's chart:  A. Medical and social history B. Use of alcohol, tobacco or illicit drugs  C. Current medications and supplements D. Functional ability and status E.  Nutritional status F.  Physical activity G. Advance directives H. List of other physicians I.  Hospitalizations, surgeries, and ER visits in previous 12 months J.  [place] such as hearing and vision if needed, cognitive and depression L. Referrals and appointments - none  In addition, I have reviewed and discussed with patient certain preventive protocols, quality metrics, and best practice recommendations. A written personalized care plan for preventive services as well as general preventive health recommendations were provided to patient.  See attached scanned questionnaire for additional information.   Signed,  Fabio Neighbors, LPN Nurse Health  Advisor   MD Recommendations:  Follow up on Hgb A1c.  I have reviewed the health advisors note, was  available for consultation and I agree with documentation and plan. Miguel Aschoff MD [facility]

## 2016-10-14 NOTE — Progress Notes (Signed)
Lori Berger  MRN: [id number] DOB: 09/20/1941  Subjective:  HPI   The patient is a 76 year old who presents for follow up of diabetes, cholesterol and hypertension.  She checks her glucose daily and has been getting readings that have ranged 100-130.  Today her reading was high at 215, but the patient states that she thinks she fell sleep last night without taking her medication and she also had pie last night.   The patient states that she came off of her Simvastatin because of possible side effects.  She states that her daughter in law insistant she not take the cholesterol medication. The patient has not had her A1C since the beginning of 2017.  She is also due to have her other labs done. The patient is also here to be seen for her hypertension and states she has been checking it at home on a regular basis and it has been running good. She is a widowed mother of 3 sons. One son has been totally estranged for 12 years. She has no idea where he is. Patient states she had a syncopal episode in the grocery store March of last year. This sounded like vasovagal syncope. EMS was called and she sent them away. Patient Active Problem List   Diagnosis Date Noted  . Personal history of malignant neoplasm of breast 06/29/2016  . Malignant neoplasm of right female breast ([place]) 03/30/2016  . Loss of weight 03/30/2016  . Faintness 01/26/2016  . Osteopenia 01/26/2016  . Breast cancer, female, right 09/26/2015  . Basal cell carcinoma of face 02/07/2015  . Diabetes mellitus, type 2 ([place]) 02/07/2015  . Dysfunction of eustachian tube 02/07/2015  . 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 Malignant neoplasm of corpus uteri, except isthmus ([place]) 02/07/2015  . Polyp of corpus uteri 02/07/2015  . Essential (primary) hypertension 02/07/2015  . Need for prophylactic hormone replacement therapy (postmenopausal) 02/07/2015  . Decreased potassium in the blood 02/07/2015  . Affective  disorder, major 02/07/2015  . Malaise and fatigue 02/07/2015  . Combined fat and carbohydrate induced hyperlipemia 02/07/2015  . Adiposity 02/07/2015  . Arthritis, degenerative 02/07/2015  . Allergic rhinitis 02/07/2015  . Postmenopausal bleeding 02/07/2015  . Psychosis 02/07/2015  . Inflammation of sacroiliac joint ([place]) 02/07/2015    Past Medical History:  Diagnosis Date  . Arthritis   . BCC (basal cell carcinoma)   . Breast cancer ([place]) 1990   left 1990  . Breast cancer ([place]) 11/11/2015   Right, T2 (2.4 cm(, N0; positive deep margin, ER: 100%, PR: 60%; Her 2 neu not amplified, Mammoprint: low risk  . Diabetes mellitus without complication ([place])   . Endometrial cancer ([place])   . Hyperlipidemia   . Hypertension   . Major depressive disorder   . PONV (postoperative nausea and vomiting)     Social History   Social History  . Marital status: Widowed    Spouse name: widowed  . Number of children: 3  . Years of education: N/A   Occupational History  . Not on file.   Social History Main Topics  . Smoking status: Never Smoker  . Smokeless tobacco: Never Used  . Alcohol use No  . Drug use: No  . Sexual activity: No   Other Topics Concern  . Not on file   Social History Narrative  . No narrative on file    Outpatient Encounter Prescriptions as of 10/14/2016  Medication Sig  . amLODipine (NORVASC) 5 MG tablet TAKE  1 TABLET BY MOUTH ONCE A DAY  . Calcium Carb-Cholecalciferol (CALCIUM 600+D) 600-800 MG-UNIT TABS Take 1 tablet by mouth daily.  Marland Kitchen glimepiride (AMARYL) 4 MG tablet TAKE 1 TABLET BY MOUTH ONCE A DAY  . Ibuprofen (ADVIL PO) Take by mouth as needed.  . INVOKAMET 150-500 MG TABS TAKE 1 TABLET BY MOUTH TWICE A DAY  . letrozole (FEMARA) 2.5 MG tablet Take 1 tablet (2.5 mg total) by mouth daily.  Marland Kitchen lisinopril (PRINIVIL,ZESTRIL) 20 MG tablet TAKE 1 TABLET BY MOUTH ONCE A DAY  . [DISCONTINUED] simvastatin (ZOCOR) 10 MG tablet TAKE 1 TABLET BY MOUTH EVERY NIGHT AT  BEDTIME (Patient not taking: Reported on 10/14/2016)   No facility-administered encounter medications on file as of 10/14/2016.     Allergies  Allergen Reactions  . Penicillins     Review of Systems  Constitutional: Negative.   HENT: Positive for hearing loss.   Eyes: Negative.   Respiratory: Negative.   Cardiovascular: Negative.   Gastrointestinal: Negative.   Genitourinary: Negative.   Musculoskeletal: Positive for joint pain.  Skin: Positive for rash.       Being seen by dermatology  Neurological: Positive for dizziness.  Endo/Heme/Allergies: Negative.        Cold intolerance  Psychiatric/Behavioral: Negative.     Objective:  BP 122/76   Pulse 96   Temp 97.9 F (36.6 C) (Oral)   Wt 195 lb (88.5 kg)   BMI 33.47 kg/m   Physical Exam  Constitutional: She is oriented to person, place, and time and well-developed, well-nourished, and in no distress.  HENT:  Head: Normocephalic and atraumatic.  Eyes: Conjunctivae are normal. No scleral icterus.  Neck: No thyromegaly present.  Cardiovascular: Normal rate, regular rhythm and normal heart sounds.   Pulmonary/Chest: Effort normal and breath sounds normal.  Abdominal: Soft.  Neurological: She is alert and oriented to person, place, and time.  Grossly nonfocal.  Skin: Skin is warm and dry.  Psychiatric: Mood, memory, affect and judgment normal.    Assessment and Plan :  1.Prediabetes 2.Likely vasovagal syncope Recommended etiology referral and she declines. 3. Breast cancer 4. Hypertension 5. Hyperlipidemia Patient stop statin last year on her own better.daughter-in-law's request. We'll recheck lipids and give her advice.  I have done the exam and reviewed the chart and it is accurate to the best of my knowledge. Development worker, community has been used and  any errors in dictation or transcription are unintentional. Miguel Aschoff M.D. [facility]

## 2016-10-14 NOTE — Patient Instructions (Signed)
Health Maintenance, Female Introduction Adopting a healthy lifestyle and getting preventive care can go a long way to promote health and wellness. Talk with your health care provider about what schedule of regular examinations is right for you. This is a good chance for you to check in with your provider about disease prevention and staying healthy. In between checkups, there are plenty of things you can do on your own. Experts have done a lot of research about which lifestyle changes and preventive measures are most likely to keep you healthy. Ask your health care provider for more information. Weight and diet Eat a healthy diet  Be sure to include plenty of vegetables, fruits, low-fat dairy products, and lean protein.  Do not eat a lot of foods high in solid fats, added sugars, or salt.  Get regular exercise. This is one of the most important things you can do for your health.  Most adults should exercise for at least 150 minutes each week. The exercise should increase your heart rate and make you sweat (moderate-intensity exercise).  Most adults should also do strengthening exercises at least twice a week. This is in addition to the moderate-intensity exercise. Maintain a healthy weight  Body mass index (BMI) is a measurement that can be used to identify possible weight problems. It estimates body fat based on height and weight. Your health care provider can help determine your BMI and help you achieve or maintain a healthy weight.  For females 4 years of age and older:  A BMI below 18.5 is considered underweight.  A BMI of 18.5 to 24.9 is normal.  A BMI of 25 to 29.9 is considered overweight.  A BMI of 30 and above is considered obese. Watch levels of cholesterol and blood lipids  You should start having your blood tested for lipids and cholesterol at 78 years of age, then have this test every 5 years.  You may need to have your cholesterol levels checked more often  if:  Your lipid or cholesterol levels are high.  You are older than 78 years of age.  You are at high risk for heart disease. Cancer screening Lung Cancer  Lung cancer screening is recommended for adults 12-31 years old who are at high risk for lung cancer because of a history of smoking.  A yearly low-dose CT scan of the lungs is recommended for people who:  Currently smoke.  Have quit within the past 15 years.  Have at least a 30-pack-year history of smoking. A pack year is smoking an average of one pack of cigarettes a day for 1 year.  Yearly screening should continue until it has been 15 years since you quit.  Yearly screening should stop if you develop a health problem that would prevent you from having lung cancer treatment. Breast Cancer  Practice breast self-awareness. This means understanding how your breasts normally appear and feel.  It also means doing regular breast self-exams. Let your health care provider know about any changes, no matter how Bal[place].  If you are in your 20s or 30s, you should have a clinical breast exam (CBE) by a health care provider every 1-3 years as part of a regular health exam.  If you are 74 or older, have a CBE every year. Also consider having a breast X-ray (mammogram) every year.  If you have a family history of breast cancer, talk to your health care provider about genetic screening.  If you are at high risk for breast cancer,  talk to your health care provider about having an MRI and a mammogram every year.  Breast cancer gene (BRCA) assessment is recommended for women who have family members with BRCA-related cancers. BRCA-related cancers include:  Breast.  Ovarian.  Tubal.  Peritoneal cancers.  Results of the assessment will determine the need for genetic counseling and BRCA1 and BRCA2 testing. Colorectal Cancer  This type of cancer can be detected and often prevented.  Routine colorectal cancer screening usually begins  at 78 years of age and continues through 78 years of age.  Your health care provider may recommend screening at an earlier age if you have risk factors for colon cancer.  Your health care provider may also recommend using home test kits to check for hidden blood in the stool.  A Genson camera at the end of a tube can be used to examine your colon directly (sigmoidoscopy or colonoscopy). This is done to check for the earliest forms of colorectal cancer.  Routine screening usually begins at age 50.  Direct examination of the colon should be repeated every 5-10 years through 78 years of age. However, you may need to be screened more often if early forms of precancerous polyps or Gerads growths are found. Skin Cancer  Check your skin from head to toe regularly.  Tell your health care provider about any new moles or changes in moles, especially if there is a change in a mole's shape or color.  Also tell your health care provider if you have a mole that is larger than the size of a pencil eraser.  Always use sunscreen. Apply sunscreen liberally and repeatedly throughout the day.  Protect yourself by wearing long sleeves, pants, a wide-brimmed hat, and sunglasses whenever you are outside. Heart disease, diabetes, and high blood pressure  High blood pressure causes heart disease and increases the risk of stroke. High blood pressure is more likely to develop in:  People who have blood pressure in the high end of the normal range (130-139/85-89 mm Hg).  People who are overweight or obese.  People who are African American.  If you are 18-39 years of age, have your blood pressure checked every 3-5 years. If you are 40 years of age or older, have your blood pressure checked every year. You should have your blood pressure measured twice-once when you are at a hospital or clinic, and once when you are not at a hospital or clinic. Record the average of the two measurements. To check your blood pressure  when you are not at a hospital or clinic, you can use:  An automated blood pressure machine at a pharmacy.  A home blood pressure monitor.  If you are between 55 years and 79 years old, ask your health care provider if you should take aspirin to prevent strokes.  Have regular diabetes screenings. This involves taking a blood sample to check your fasting blood sugar level.  If you are at a normal weight and have a low risk for diabetes, have this test once every three years after 78 years of age.  If you are overweight and have a high risk for diabetes, consider being tested at a younger age or more often. Preventing infection Hepatitis B  If you have a higher risk for hepatitis B, you should be screened for this virus. You are considered at high risk for hepatitis B if:  You were born in a country where hepatitis B is common. Ask your health care provider which countries are   considered high risk.  Your parents were born in a high-risk country, and you have not been immunized against hepatitis B (hepatitis B vaccine).  You have HIV or AIDS.  You use needles to inject street drugs.  You live with someone who has hepatitis B.  You have had sex with someone who has hepatitis B.  You get hemodialysis treatment.  You take certain medicines for conditions, including cancer, organ transplantation, and autoimmune conditions. Hepatitis C  Blood testing is recommended for:  Everyone born from 1945 through 1965.  Anyone with known risk factors for hepatitis C. Osteoporosis and menopause  Osteoporosis is a disease in which the bones lose minerals and strength with aging. This can result in serious bone fractures. Your risk for osteoporosis can be identified using a bone density scan.  If you are 65 years of age or older, or if you are at risk for osteoporosis and fractures, ask your health care provider if you should be screened.  Ask your health care provider whether you should take  a calcium or vitamin D supplement to lower your risk for osteoporosis.  Menopause may have certain physical symptoms and risks.  Hormone replacement therapy may reduce some of these symptoms and risks. Talk to your health care provider about whether hormone replacement therapy is right for you. Follow these instructions at home:  Schedule regular health, dental, and eye exams.  Stay current with your immunizations.  Do not use any tobacco products including cigarettes, chewing tobacco, or electronic cigarettes.  If you are pregnant, do not drink alcohol.  If you are breastfeeding, limit how much and how often you drink alcohol.  Limit alcohol intake to no more than 1 drink per day for nonpregnant women. One drink equals 12 ounces of beer, 5 ounces of wine, or 1 ounces of hard liquor.  Do not use street drugs.  Do not share needles.  Ask your health care provider for help if you need support or information about quitting drugs.  Tell your health care provider if you often feel depressed.  Tell your health care provider if you have ever been abused or do not feel safe at home. This information is not intended to replace advice given to you by your health care provider. Make sure you discuss any questions you have with your health care provider. Document Released: 04/05/2011 Document Revised: 02/26/2016 Document Reviewed: 06/24/2015  2017 Elsevier  

## 2016-10-19 DIAGNOSIS — E119 Type 2 diabetes mellitus without complications: Secondary | ICD-10-CM | POA: Diagnosis not present

## 2016-10-19 DIAGNOSIS — E782 Mixed hyperlipidemia: Secondary | ICD-10-CM | POA: Diagnosis not present

## 2016-10-19 DIAGNOSIS — I10 Essential (primary) hypertension: Secondary | ICD-10-CM | POA: Diagnosis not present

## 2016-10-20 LAB — LIPID PANEL WITH LDL/HDL RATIO
Cholesterol, Total: 249 mg/dL — ABNORMAL HIGH (ref 100–199)
HDL: 40 mg/dL (ref >39)
LDL Calculated: 136 mg/dL — ABNORMAL HIGH (ref 0–99)
LDL/HDL RATIO: 3.4 ratio — AB (ref 0.0–3.2)
TRIGLYCERIDES: 367 mg/dL — AB (ref 0–149)
VLDL CHOLESTEROL CAL: 73 mg/dL — AB (ref 5–40)

## 2016-10-20 LAB — COMPREHENSIVE METABOLIC PANEL
A/G RATIO: 1.7 (ref 1.2–2.2)
ALK PHOS: 90 IU/L (ref 39–117)
ALT: 12 IU/L (ref 0–32)
AST: 16 IU/L (ref 0–40)
Albumin: 4 g/dL (ref 3.5–4.8)
BILIRUBIN TOTAL: 0.3 mg/dL (ref 0.0–1.2)
BUN / CREAT RATIO: 22 (ref 12–28)
BUN: 20 mg/dL (ref 8–27)
CO2: 20 mmol/L (ref 18–29)
CREATININE: 0.93 mg/dL (ref 0.57–1.00)
Calcium: 9.6 mg/dL (ref 8.7–10.3)
Chloride: 103 mmol/L (ref 96–106)
GFR calc Af Amer: 69 mL/min/{1.73_m2} (ref >59)
GFR, EST NON AFRICAN AMERICAN: 59 mL/min/{1.73_m2} — AB (ref >59)
Globulin, Total: 2.4 g/dL (ref 1.5–4.5)
Glucose: 181 mg/dL — ABNORMAL HIGH (ref 65–99)
Potassium: 4.7 mmol/L (ref 3.5–5.2)
SODIUM: 141 mmol/L (ref 134–144)
TOTAL PROTEIN: 6.4 g/dL (ref 6.0–8.5)

## 2016-10-20 LAB — CBC WITH DIFFERENTIAL/PLATELET
BASOS ABS: 0 10*3/uL (ref 0.0–0.2)
BASOS: 0 %
EOS (ABSOLUTE): 0.1 10*3/uL (ref 0.0–0.4)
Eos: 1 %
Hematocrit: 43.3 % (ref 34.0–46.6)
Hemoglobin: 14.6 g/dL (ref 11.1–15.9)
Immature Grans (Abs): 0 10*3/uL (ref 0.0–0.1)
Immature Granulocytes: 0 %
LYMPHS ABS: 3.4 10*3/uL — AB (ref 0.7–3.1)
Lymphs: 44 %
MCH: 30.9 pg (ref 26.6–33.0)
MCHC: 33.7 g/dL (ref 31.5–35.7)
MCV: 92 fL (ref 79–97)
MONOS ABS: 0.3 10*3/uL (ref 0.1–0.9)
Monocytes: 5 %
NEUTROS ABS: 3.8 10*3/uL (ref 1.4–7.0)
Neutrophils: 50 %
PLATELETS: 257 10*3/uL (ref 150–379)
RBC: 4.73 x10E6/uL (ref 3.77–5.28)
RDW: 13.2 % (ref 12.3–15.4)
WBC: 7.6 10*3/uL (ref 3.4–10.8)

## 2016-10-20 LAB — HEMOGLOBIN A1C
ESTIMATED AVERAGE GLUCOSE: 217 mg/dL
Hgb A1c MFr Bld: 9.2 % — ABNORMAL HIGH (ref 4.8–5.6)

## 2016-10-20 LAB — TSH: TSH: 3.59 u[IU]/mL (ref 0.450–4.500)

## 2016-10-25 ENCOUNTER — Telehealth: Payer: Self-pay

## 2016-10-25 MED ORDER — SIMVASTATIN 10 MG PO TABS: 10.0000 mg | ORAL_TABLET | Freq: Every day | ORAL | 3 refills | Status: DC

## 2016-10-25 NOTE — Telephone Encounter (Signed)
Advised pt of lab results. Pt verbally acknowledges understanding. Resent medication in. Renaldo Fiddler, CMA

## 2016-10-25 NOTE — Telephone Encounter (Signed)
-----   Message from Jerrol Banana., MD sent at 10/25/2016  3:48 PM EST ----- Labs stable. Lipids higher. Would restart statin.

## 2016-12-04 ENCOUNTER — Other Ambulatory Visit: Payer: Self-pay | Admitting: Family Medicine

## 2016-12-13 DIAGNOSIS — L820 Inflamed seborrheic keratosis: Secondary | ICD-10-CM | POA: Diagnosis not present

## 2016-12-13 DIAGNOSIS — L853 Xerosis cutis: Secondary | ICD-10-CM | POA: Diagnosis not present

## 2016-12-13 DIAGNOSIS — L219 Seborrheic dermatitis, unspecified: Secondary | ICD-10-CM | POA: Diagnosis not present

## 2016-12-13 DIAGNOSIS — L570 Actinic keratosis: Secondary | ICD-10-CM | POA: Diagnosis not present

## 2016-12-13 DIAGNOSIS — L578 Other skin changes due to chronic exposure to nonionizing radiation: Secondary | ICD-10-CM | POA: Diagnosis not present

## 2017-01-02 ENCOUNTER — Inpatient Hospital Stay
Admission: EM | Admit: 2017-01-02 | Discharge: 2017-01-06 | DRG: 065 | Disposition: A | Discharge status: Home / Self Care | Payer: Medicare Other | Attending: Internal Medicine | Admitting: Internal Medicine

## 2017-01-02 ENCOUNTER — Emergency Department: Payer: Medicare Other

## 2017-01-02 ENCOUNTER — Encounter: Payer: Self-pay | Admitting: Emergency Medicine

## 2017-01-02 DIAGNOSIS — Z880 Allergy status to penicillin: Secondary | ICD-10-CM

## 2017-01-02 DIAGNOSIS — I10 Essential (primary) hypertension: Secondary | ICD-10-CM | POA: Diagnosis present

## 2017-01-02 DIAGNOSIS — Z8542 Personal history of malignant neoplasm of other parts of uterus: Secondary | ICD-10-CM | POA: Diagnosis not present

## 2017-01-02 DIAGNOSIS — Z90710 Acquired absence of both cervix and uterus: Secondary | ICD-10-CM | POA: Diagnosis not present

## 2017-01-02 DIAGNOSIS — I63532 Cerebral infarction due to unspecified occlusion or stenosis of left posterior cerebral artery: Secondary | ICD-10-CM | POA: Diagnosis not present

## 2017-01-02 DIAGNOSIS — E1165 Type 2 diabetes mellitus with hyperglycemia: Secondary | ICD-10-CM | POA: Diagnosis present

## 2017-01-02 DIAGNOSIS — E785 Hyperlipidemia, unspecified: Secondary | ICD-10-CM | POA: Diagnosis not present

## 2017-01-02 DIAGNOSIS — E876 Hypokalemia: Secondary | ICD-10-CM | POA: Diagnosis present

## 2017-01-02 DIAGNOSIS — I639 Cerebral infarction, unspecified: Secondary | ICD-10-CM | POA: Diagnosis not present

## 2017-01-02 DIAGNOSIS — M1990 Unspecified osteoarthritis, unspecified site: Secondary | ICD-10-CM | POA: Diagnosis present

## 2017-01-02 DIAGNOSIS — R270 Ataxia, unspecified: Secondary | ICD-10-CM | POA: Diagnosis present

## 2017-01-02 DIAGNOSIS — Z8249 Family history of ischemic heart disease and other diseases of the circulatory system: Secondary | ICD-10-CM | POA: Diagnosis not present

## 2017-01-02 DIAGNOSIS — Z79899 Other long term (current) drug therapy: Secondary | ICD-10-CM

## 2017-01-02 DIAGNOSIS — Z9012 Acquired absence of left breast and nipple: Secondary | ICD-10-CM

## 2017-01-02 DIAGNOSIS — R29700 NIHSS score 0: Secondary | ICD-10-CM | POA: Diagnosis present

## 2017-01-02 DIAGNOSIS — E119 Type 2 diabetes mellitus without complications: Secondary | ICD-10-CM | POA: Diagnosis not present

## 2017-01-02 DIAGNOSIS — I6523 Occlusion and stenosis of bilateral carotid arteries: Secondary | ICD-10-CM | POA: Diagnosis not present

## 2017-01-02 DIAGNOSIS — R4182 Altered mental status, unspecified: Secondary | ICD-10-CM | POA: Diagnosis not present

## 2017-01-02 DIAGNOSIS — Z85828 Personal history of other malignant neoplasm of skin: Secondary | ICD-10-CM | POA: Diagnosis not present

## 2017-01-02 DIAGNOSIS — Z823 Family history of stroke: Secondary | ICD-10-CM

## 2017-01-02 DIAGNOSIS — I6622 Occlusion and stenosis of left posterior cerebral artery: Secondary | ICD-10-CM | POA: Diagnosis not present

## 2017-01-02 DIAGNOSIS — I6350 Cerebral infarction due to unspecified occlusion or stenosis of unspecified cerebral artery: Secondary | ICD-10-CM | POA: Diagnosis not present

## 2017-01-02 DIAGNOSIS — N390 Urinary tract infection, site not specified: Secondary | ICD-10-CM | POA: Diagnosis not present

## 2017-01-02 DIAGNOSIS — Z8051 Family history of malignant neoplasm of kidney: Secondary | ICD-10-CM

## 2017-01-02 DIAGNOSIS — Z853 Personal history of malignant neoplasm of breast: Secondary | ICD-10-CM

## 2017-01-02 DIAGNOSIS — Z66 Do not resuscitate: Secondary | ICD-10-CM | POA: Diagnosis present

## 2017-01-02 LAB — COMPREHENSIVE METABOLIC PANEL
ALT: 13 U/L — AB (ref 14–54)
AST: 17 U/L (ref 15–41)
Albumin: 3.9 g/dL (ref 3.5–5.0)
Alkaline Phosphatase: 78 U/L (ref 38–126)
Anion gap: 11 (ref 5–15)
BUN: 27 mg/dL — AB (ref 6–20)
CHLORIDE: 109 mmol/L (ref 101–111)
CO2: 19 mmol/L — AB (ref 22–32)
CREATININE: 1.15 mg/dL — AB (ref 0.44–1.00)
Calcium: 9.4 mg/dL (ref 8.9–10.3)
GFR calc Af Amer: 52 mL/min — ABNORMAL LOW (ref >60)
GFR, EST NON AFRICAN AMERICAN: 45 mL/min — AB (ref >60)
GLUCOSE: 355 mg/dL — AB (ref 65–99)
Potassium: 3.9 mmol/L (ref 3.5–5.1)
Sodium: 139 mmol/L (ref 135–145)
Total Bilirubin: 0.6 mg/dL (ref 0.3–1.2)
Total Protein: 7 g/dL (ref 6.5–8.1)

## 2017-01-02 LAB — URINALYSIS, COMPLETE (UACMP) WITH MICROSCOPIC
BILIRUBIN URINE: NEGATIVE
Bacteria, UA: NONE SEEN
Ketones, ur: 5 mg/dL — AB
NITRITE: NEGATIVE
PH: 5 (ref 5.0–8.0)
Protein, ur: NEGATIVE mg/dL
SPECIFIC GRAVITY, URINE: 1.028 (ref 1.005–1.030)

## 2017-01-02 LAB — LIPID PANEL
CHOLESTEROL: 163 mg/dL (ref 0–200)
HDL: 49 mg/dL (ref >40)
LDL Cholesterol: 60 mg/dL (ref 0–99)
TRIGLYCERIDES: 271 mg/dL — AB (ref <150)
Total CHOL/HDL Ratio: 3.3 RATIO
VLDL: 54 mg/dL — ABNORMAL HIGH (ref 0–40)

## 2017-01-02 LAB — CBC
HCT: 39.9 % (ref 35.0–47.0)
Hemoglobin: 13.8 g/dL (ref 12.0–16.0)
MCH: 31.9 pg (ref 26.0–34.0)
MCHC: 34.4 g/dL (ref 32.0–36.0)
MCV: 92.6 fL (ref 80.0–100.0)
Platelets: 192 10*3/uL (ref 150–440)
RBC: 4.31 MIL/uL (ref 3.80–5.20)
RDW: 13.1 % (ref 11.5–14.5)
WBC: 9 10*3/uL (ref 3.6–11.0)

## 2017-01-02 LAB — GLUCOSE, CAPILLARY
GLUCOSE-CAPILLARY: 232 mg/dL — AB (ref 65–99)
Glucose-Capillary: 344 mg/dL — ABNORMAL HIGH (ref 65–99)
Glucose-Capillary: 217 mg/dL — ABNORMAL HIGH (ref 65–99)

## 2017-01-02 MED ORDER — LISINOPRIL 20 MG PO TABS: 20.0000 mg | ORAL_TABLET | Freq: Every day | ORAL | Status: DC

## 2017-01-02 MED ORDER — AMLODIPINE BESYLATE 5 MG PO TABS
5.0000 mg | ORAL_TABLET | Freq: Every day | ORAL | Status: DC
Start: 2017-01-03 — End: 2017-01-03

## 2017-01-02 MED ORDER — ASPIRIN 81 MG PO CHEW
324.0000 mg | CHEWABLE_TABLET | Freq: Once | ORAL | Status: AC
  Administered 2017-01-02: 324 mg via ORAL
  Filled 2017-01-02: qty 4

## 2017-01-02 MED ORDER — LETROZOLE 2.5 MG PO TABS
2.5000 mg | ORAL_TABLET | Freq: Every day | ORAL | Status: DC
  Administered 2017-01-03 – 2017-01-06 (×4): 2.5 mg via ORAL
  Filled 2017-01-02 (×4): qty 1

## 2017-01-02 MED ORDER — CEPHALEXIN 500 MG PO CAPS
500.0000 mg | ORAL_CAPSULE | Freq: Once | ORAL | Status: DC
  Filled 2017-01-02: qty 1

## 2017-01-02 MED ORDER — INSULIN ASPART 100 UNIT/ML ~~LOC~~ SOLN: 0.0000 [IU] | Freq: Three times a day (TID) | SUBCUTANEOUS | Status: DC

## 2017-01-02 MED ORDER — CEFTRIAXONE SODIUM-DEXTROSE 1-3.74 GM-% IV SOLR
1.0000 g | Freq: Once | INTRAVENOUS | Status: AC
  Administered 2017-01-02: 1 g via INTRAVENOUS
  Filled 2017-01-02: qty 50

## 2017-01-02 MED ORDER — SIMVASTATIN 20 MG PO TABS
10.0000 mg | ORAL_TABLET | Freq: Every day | ORAL | Status: DC
  Administered 2017-01-02 – 2017-01-05 (×4): 10 mg via ORAL
  Filled 2017-01-02 (×4): qty 1

## 2017-01-02 MED ORDER — HEPARIN SODIUM (PORCINE) 5000 UNIT/ML IJ SOLN
5000.0000 [IU] | Freq: Three times a day (TID) | INTRAMUSCULAR | Status: DC
  Administered 2017-01-03 – 2017-01-06 (×11): 5000 [IU] via SUBCUTANEOUS
  Filled 2017-01-02 (×11): qty 1

## 2017-01-02 MED ORDER — IOPAMIDOL (ISOVUE-370) INJECTION 76%
60.0000 mL | Freq: Once | INTRAVENOUS | Status: AC | PRN
  Administered 2017-01-02: 60 mL via INTRAVENOUS

## 2017-01-02 MED ORDER — DOCUSATE SODIUM 100 MG PO CAPS: 100.0000 mg | ORAL_CAPSULE | Freq: Two times a day (BID) | ORAL | Status: DC | PRN

## 2017-01-02 MED ORDER — GLIMEPIRIDE 2 MG PO TABS
4.0000 mg | ORAL_TABLET | Freq: Every day | ORAL | Status: DC
  Administered 2017-01-03 – 2017-01-06 (×4): 4 mg via ORAL
  Filled 2017-01-02 (×4): qty 2

## 2017-01-02 MED ORDER — DEXTROSE 5 % IV SOLN
1.0000 g | Freq: Once | INTRAVENOUS | Status: DC
  Filled 2017-01-02: qty 10

## 2017-01-02 MED ORDER — DEXTROSE 5 % IV SOLN
1.0000 g | INTRAVENOUS | Status: DC
  Administered 2017-01-03: 1 g via INTRAVENOUS
  Filled 2017-01-02 (×2): qty 10

## 2017-01-02 MED ORDER — CANAGLIFLOZIN-METFORMIN HCL 150-500 MG PO TABS: 1.0000 | ORAL_TABLET | Freq: Two times a day (BID) | ORAL | Status: DC

## 2017-01-02 MED ORDER — CALCIUM CARBONATE-VITAMIN D 500-200 MG-UNIT PO TABS
1.0000 | ORAL_TABLET | Freq: Every day | ORAL | Status: DC
  Administered 2017-01-03 – 2017-01-06 (×4): 1 via ORAL
  Filled 2017-01-02 (×4): qty 1

## 2017-01-02 MED ORDER — SODIUM CHLORIDE 0.9% FLUSH
3.0000 mL | Freq: Two times a day (BID) | INTRAVENOUS | Status: DC
  Administered 2017-01-02 – 2017-01-06 (×8): 3 mL via INTRAVENOUS

## 2017-01-02 MED ORDER — CALCIUM CARB-CHOLECALCIFEROL 600-800 MG-UNIT PO TABS: 1.0000 | ORAL_TABLET | Freq: Every day | ORAL | Status: DC

## 2017-01-02 NOTE — Progress Notes (Signed)
[place] responded to a PG for Code Stroke. Pt was being attended to upon my arrival. Pt stated she is nervous and uncertain of what will happen next. Pt was transported for an additional CT. Two sons were bedside and remained in the room. Sons provided additional information for the RN. CH provided the ministry of presence and prayer for the sons. CH is available for follow up as needed.    01/02/17 1900  Clinical Encounter Type  Visited With Patient;Patient and family together;Health care provider  Visit Type Initial;Spiritual support;Code;ED (Code Stroke)  Referral From Nurse  Consult/Referral To Chaplain  Spiritual Encounters  Spiritual Needs Prayer;Emotional

## 2017-01-02 NOTE — ED Notes (Signed)
Pt with difficulty recalling todays events; no other neuro deficits at this time.

## 2017-01-02 NOTE — ED Notes (Signed)
[place] set up at bedside for Neuro Consult.

## 2017-01-02 NOTE — ED Notes (Signed)
Pt given food tray - had passed her swallow eval earlier and states hasn't eaten since this morning. Pt alert, moving all extremities. Family at bedside.

## 2017-01-02 NOTE — Progress Notes (Addendum)
Family Meeting Note  Advance Directive:yes  Today a meeting took place with the Patient and 2 sons.  The following clinical team members were present during this meeting:MD  The following were discussed:Patient's diagnosis: DM, Htn, UTI, Stroke , Patient's progosis: Unable to determine and Goals for treatment: DNR  Additional follow-up to be provided: neurology  Time spent during discussion:20 minutes  Oluwatobi Ruppe, Rosalio Macadamia, MD

## 2017-01-02 NOTE — ED Triage Notes (Signed)
Pt here with family, reports pt was confused at church this morning, patient couldn't remember if she ate breakfast or took her diabetes medication this morning. Family reports pt's CBG was 460 at home, went to fire department and pt's BP was "high". Pt did take blood pressure medication (lisinopril) and diabetes (invokamet) medication before coming. Pt alert and oriented, able to answer questions appropriately but family reports pt has delayed responses.

## 2017-01-02 NOTE — H&P (Signed)
[facility] [place] NAME: Lori Berger    MR#:  [id number]  DATE OF BIRTH:  1941-04-15  DATE OF ADMISSION:  01/02/2017  PRIMARY CARE PHYSICIAN: Wilhemena Durie, MD   REQUESTING/REFERRING PHYSICIAN: Rifenbark  CHIEF COMPLAINT:   Chief Complaint  Patient presents with  . Hyperglycemia  . Altered Mental Status    HISTORY OF PRESENT ILLNESS: Lori Berger  is a 76 y.o. female with a known history of Arthritis, breast cancer, diabetes, endometrial cancer, hyperlipidemia, hypertension- was at church today for [employer], and was having confusion over there and also some imbalance in walking. Concerned with this she was brought to emergency room by her son after checking her blood sugar level when it was more than 400. On further workup in ER she was noted to have UTI and on CT scan of the head she was noted to have an acute stroke. Tele neurology consult was done and he suggested not to give anti-coagulation as patient's symptoms were started early in the morning and she don't have much focal symptoms.  PAST MEDICAL HISTORY:   Past Medical History:  Diagnosis Date  . Arthritis   . BCC (basal cell carcinoma)   . Breast cancer ([place]) 1990   left 1990  . Breast cancer ([place]) 11/11/2015   Right, T2 (2.4 cm(, N0; positive deep margin, ER: 100%, PR: 60%; Her 2 neu not amplified, Mammoprint: low risk  . Diabetes mellitus without complication ([place])   . Endometrial cancer ([place])   . Hyperlipidemia   . Hypertension   . Major depressive disorder   . PONV (postoperative nausea and vomiting)     PAST SURGICAL HISTORY: Past Surgical History:  Procedure Laterality Date  . ABDOMINAL HYSTERECTOMY    . MASTECTOMY Left    1990  . SENTINEL NODE BIOPSY Right 11/11/2015   Procedure: SENTINEL NODE BIOPSY;  Surgeon: Robert Bellow, MD;  Location: [facility] ORS;  Service: General;  Laterality: Right;  . SIMPLE MASTECTOMY WITH AXILLARY SENTINEL NODE BIOPSY  Right 11/11/2015   Procedure: SIMPLE MASTECTOMY;  Surgeon: Robert Bellow, MD;  Location: [facility] ORS;  Service: General;  Laterality: Right;    SOCIAL HISTORY:  Social History  Substance Use Topics  . Smoking status: Never Smoker  . Smokeless tobacco: Never Used  . Alcohol use No    FAMILY HISTORY:  Family History  Problem Relation Age of Onset  . Heart disease Mother     Fatal MI age 31  . Stroke Father     fatal age 109  . Cancer Sister     Colon  . Cancer Brother     Died from Kidney cancer  . Breast cancer Neg Hx     DRUG ALLERGIES:  Allergies  Allergen Reactions  . Penicillins     REVIEW OF SYSTEMS:   CONSTITUTIONAL: No fever, fatigue or weakness.  EYES: No blurred or double vision.  EARS, NOSE, AND THROAT: No tinnitus or ear pain.  RESPIRATORY: No cough, shortness of breath, wheezing or hemoptysis.  CARDIOVASCULAR: No chest pain, orthopnea, edema.  GASTROINTESTINAL: No nausea, vomiting, diarrhea or abdominal pain.  GENITOURINARY: No dysuria, hematuria.  ENDOCRINE: No polyuria, nocturia,  HEMATOLOGY: No anemia, easy bruising or bleeding SKIN: No rash or lesion. MUSCULOSKELETAL: No joint pain or arthritis.   NEUROLOGIC: No tingling, numbness, weakness.  PSYCHIATRY: No anxiety or depression.   MEDICATIONS AT HOME:  Prior to Admission medications   Medication Sig Start Date End  Date Taking? Authorizing Provider  amLODipine (NORVASC) 5 MG tablet TAKE 1 TABLET BY MOUTH ONCE A DAY 12/06/16  Yes Richard Maceo Pro., MD  Calcium Carb-Cholecalciferol (CALCIUM 600+D) 600-800 MG-UNIT TABS Take 1 tablet by mouth daily.   Yes Historical Provider, MD  glimepiride (AMARYL) 4 MG tablet TAKE 1 TABLET BY MOUTH ONCE A DAY 11/07/15  Yes Jerrol Banana., MD  INVOKAMET 150-500 MG TABS TAKE 1 TABLET BY MOUTH TWICE A DAY 04/22/16  Yes Jerrol Banana., MD  letrozole [facility]) 2.5 MG tablet Take 1 tablet (2.5 mg total) by mouth daily. 12/31/15  Yes Robert Bellow, MD   lisinopril (PRINIVIL,ZESTRIL) 20 MG tablet TAKE 1 TABLET BY MOUTH ONCE A DAY 11/19/15  Yes Richard Maceo Pro., MD  Ibuprofen (ADVIL PO) Take by mouth as needed.    Historical Provider, MD  simvastatin (ZOCOR) 10 MG tablet Take 1 tablet (10 mg total) by mouth at bedtime. Patient not taking: Reported on 01/02/2017 10/25/16   Jerrol Banana., MD      PHYSICAL EXAMINATION:   VITAL SIGNS: Blood pressure (!) 170/115, pulse 89, temperature 98.1 F (36.7 C), temperature source Oral, resp. rate 14, height 5\' 5"  (1.651 m), weight 81.6 kg (180 lb), SpO2 100 %.  GENERAL:  76 y.o.-year-old patient lying in the bed with no acute distress.  EYES: Pupils equal, round, reactive to light and accommodation. No scleral icterus. Extraocular muscles intact.  HEENT: Head atraumatic, normocephalic. Oropharynx and nasopharynx clear.  NECK:  Supple, no jugular venous distention. No thyroid enlargement, no tenderness.  LUNGS: Normal breath sounds bilaterally, no wheezing, rales,rhonchi or crepitation. No use of accessory muscles of respiration.  CARDIOVASCULAR: S1, S2 normal. No murmurs, rubs, or gallops.  ABDOMEN: Soft, nontender, nondistended. Bowel sounds present. No organomegaly or mass.  EXTREMITIES: No pedal edema, cyanosis, or clubbing.  NEUROLOGIC: Cranial nerves II through XII are intact. Muscle strength 5/5 in all extremities. Sensation intact. Gait not checked.  PSYCHIATRIC: The patient is alert and oriented x 3.  SKIN: No obvious rash, lesion, or ulcer.   LABORATORY PANEL:   CBC  Recent Labs Lab 01/02/17 1423  WBC 9.0  HGB 13.8  HCT 39.9  PLT 192  MCV 92.6  MCH 31.9  MCHC 34.4  RDW 13.1   ------------------------------------------------------------------------------------------------------------------  Chemistries   Recent Labs Lab 01/02/17 1423  NA 139  K 3.9  CL 109  CO2 19*  GLUCOSE 355*  BUN 27*  CREATININE 1.15*  CALCIUM 9.4  AST 17  ALT 13*  ALKPHOS 78  BILITOT  0.6   ------------------------------------------------------------------------------------------------------------------ estimated creatinine clearance is 43.2 mL/min (A) (by C-G formula based on SCr of 1.15 mg/dL (H)). ------------------------------------------------------------------------------------------------------------------ No results for input(s): TSH, T4TOTAL, T3FREE, THYROIDAB in the last 72 hours.  Invalid input(s): FREET3   Coagulation profile No results for input(s): INR, PROTIME in the last 168 hours. ------------------------------------------------------------------------------------------------------------------- No results for input(s): DDIMER in the last 72 hours. -------------------------------------------------------------------------------------------------------------------  Cardiac Enzymes No results for input(s): CKMB, TROPONINI, MYOGLOBIN in the last 168 hours.  Invalid input(s): CK ------------------------------------------------------------------------------------------------------------------ Invalid input(s): POCBNP  ---------------------------------------------------------------------------------------------------------------  Urinalysis    Component Value Date/Time   COLORURINE STRAW (A) 01/02/2017 1428   APPEARANCEUR HAZY (A) 01/02/2017 1428   LABSPEC 1.028 01/02/2017 1428   PHURINE 5.0 01/02/2017 1428   GLUCOSEU >=500 (A) 01/02/2017 1428   HGBUR [place] (A) 01/02/2017 1428   BILIRUBINUR NEGATIVE 01/02/2017 1428   BILIRUBINUR [place] 09/08/2015 1416   KETONESUR 5 (A) 01/02/2017  [place] 01/02/2017 1428   UROBILINOGEN 1.0 09/08/2015 1416   NITRITE NEGATIVE 01/02/2017 1428   LEUKOCYTESUR MODERATE (A) 01/02/2017 1428     RADIOLOGY: Ct Angio Head W Or Wo Contrast  Result Date: 01/02/2017 CLINICAL DATA:  Altered mental status.  Left PCA infarct on CT. EXAM: CT ANGIOGRAPHY HEAD AND NECK TECHNIQUE: Multidetector CT imaging of the  head and neck was performed using the standard protocol during bolus administration of intravenous contrast. Multiplanar CT image reconstructions and MIPs were obtained to evaluate the vascular anatomy. Carotid stenosis measurements (when applicable) are obtained utilizing NASCET criteria, using the distal internal carotid diameter as the denominator. CONTRAST:  60 mL Isovue 370 COMPARISON:  Noncontrast head CT earlier today. No prior angiographic imaging. FINDINGS: CTA NECK FINDINGS Aortic arch: Standard 3 vessel aortic arch with mild atherosclerotic plaque. Widely patent brachiocephalic and subclavian arteries. Right carotid system: Patent without evidence of dissection or significant stenosis. Minimal soft plaque in the proximal ICA. Left carotid system: Patent without evidence of dissection or significant stenosis. Punctate focus of calcified plaque in the proximal ICA. Vertebral arteries: Patent without evidence of dissection or significant stenosis. The left vertebral artery is minimally larger than the right. Skeleton: Mild cervical spondylosis. Other neck: No mass or lymph node enlargement. Upper chest: Mild scarring of the lung apices. Review of the MIP images confirms the above findings CTA HEAD FINDINGS Anterior circulation: The internal carotid arteries are patent from skullbase to carotid termini with mild siphon atherosclerosis but no significant stenosis. There is a 1.5 mm left supraclinoid ICA aneurysm projecting inferomedially. There is also a 2 mm aneurysm projecting inferiorly from the right supraclinoid ICA at the same level. ACAs and MCAs are patent with mild branch vessel irregularity but no significant proximal stenosis. Posterior circulation: The intracranial vertebral arteries are widely patent to the basilar. Patent PICA and SCA origins are visualized bilaterally. The basilar artery is widely patent. There is a Venters right posterior communicating artery. The PCAs are patent without evidence  of significant proximal stenosis on the right. There is a severe proximal left P2 stenosis, and there is left P3 occlusion associated with the infarct described on earlier CT. No aneurysm. Venous sinuses: Patent. Anatomic variants: None. Delayed phase: No abnormal enhancement. Review of the MIP images confirms the above findings IMPRESSION: 1. Severe proximal left P2 PCA stenosis.  Left P3 occlusion. 2. No significant anterior circulation stenosis. 3. No cervical carotid or vertebral artery stenosis. 4. 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 Electronically Signed   By: Logan Bores M.D.   On: 01/02/2017 19:13   Ct Head Wo Contrast  Result Date: 01/02/2017 CLINICAL DATA:  Initial evaluation for acute altered mental status. EXAM: CT HEAD WITHOUT CONTRAST TECHNIQUE: Contiguous axial images were obtained from the base of the skull through the vertex without intravenous contrast. COMPARISON:  None. FINDINGS: Brain: Diffuse prominence of the CSF containing spaces is compatible with generalized age-related cerebral atrophy. Patchy hypodensity within the periventricular and deep white matter both cerebral hemispheres most consistent with chronic microvascular ischemic disease, moderate in nature. Multifocal areas of encephalomalacia involving the bilateral frontal lobes likely related to remote ischemic infarcts. Additional remote right cerebellar infarct noted. There is geographic hypodensity involving the parasagittal left occipital lobe, compatible with acute/early subacute left PCA territory infarct (series 2, image 13). No associated mass effect or hemorrhage. No other evidence for acute intracranial hemorrhage. No other large vessel territory infarct. No mass lesion, midline shift or mass effect.  Diffuse ventricular prominence related to global parenchymal volume loss without hydrocephalus. No extra-axial fluid collection. Vascular: No asymmetric hyperdense vessel. Scattered vascular calcifications noted  within the carotid siphons and vertebrobasilar system. Skull: Scalp soft tissues within normal limits.  Calvarium intact. Sinuses/Orbits: Globes and orbital soft tissues within normal limits. Paranasal sinuses and mastoid air cells are clear. Other: None. IMPRESSION: 1. Findings consistent with acute/early subacute left PCA territory infarct. No associated hemorrhage or mass effect. 2. Additional scattered remote bifrontal and right cerebellar ischemic infarcts. 3. Generalized age-related cerebral atrophy with moderate chronic microvascular ischemic disease. Electronically Signed   By: Jeannine Boga M.D.   On: 01/02/2017 18:09   Ct Angio Neck W And/or Wo Contrast  Result Date: 01/02/2017 CLINICAL DATA:  Altered mental status.  Left PCA infarct on CT. EXAM: CT ANGIOGRAPHY HEAD AND NECK TECHNIQUE: Multidetector CT imaging of the head and neck was performed using the standard protocol during bolus administration of intravenous contrast. Multiplanar CT image reconstructions and MIPs were obtained to evaluate the vascular anatomy. Carotid stenosis measurements (when applicable) are obtained utilizing NASCET criteria, using the distal internal carotid diameter as the denominator. CONTRAST:  60 mL Isovue 370 COMPARISON:  Noncontrast head CT earlier today. No prior angiographic imaging. FINDINGS: CTA NECK FINDINGS Aortic arch: Standard 3 vessel aortic arch with mild atherosclerotic plaque. Widely patent brachiocephalic and subclavian arteries. Right carotid system: Patent without evidence of dissection or significant stenosis. Minimal soft plaque in the proximal ICA. Left carotid system: Patent without evidence of dissection or significant stenosis. Punctate focus of calcified plaque in the proximal ICA. Vertebral arteries: Patent without evidence of dissection or significant stenosis. The left vertebral artery is minimally larger than the right. Skeleton: Mild cervical spondylosis. Other neck: No mass or lymph  node enlargement. Upper chest: Mild scarring of the lung apices. Review of the MIP images confirms the above findings CTA HEAD FINDINGS Anterior circulation: The internal carotid arteries are patent from skullbase to carotid termini with mild siphon atherosclerosis but no significant stenosis. There is a 1.5 mm left supraclinoid ICA aneurysm projecting inferomedially. There is also a 2 mm aneurysm projecting inferiorly from the right supraclinoid ICA at the same level. ACAs and MCAs are patent with mild branch vessel irregularity but no significant proximal stenosis. Posterior circulation: The intracranial vertebral arteries are widely patent to the basilar. Patent PICA and SCA origins are visualized bilaterally. The basilar artery is widely patent. There is a Andrades right posterior communicating artery. The PCAs are patent without evidence of significant proximal stenosis on the right. There is a severe proximal left P2 stenosis, and there is left P3 occlusion associated with the infarct described on earlier CT. No aneurysm. Venous sinuses: Patent. Anatomic variants: None. Delayed phase: No abnormal enhancement. Review of the MIP images confirms the above findings IMPRESSION: 1. Severe proximal left P2 PCA stenosis.  Left P3 occlusion. 2. No significant anterior circulation stenosis. 3. No cervical carotid or vertebral artery stenosis. 4. 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 Electronically Signed   By: Logan Bores M.D.   On: 01/02/2017 19:13    EKG: Orders placed or performed in visit on 10/14/16  . EKG 12-Lead    IMPRESSION AND PLAN:  * Acute stroke   Will admit to medical service and keep on telemetry monitoring.   Frequent neuro checks and physical therapy evaluation.    Check MRI on the brain, echocardiogram, carotid Doppler studies.   Neurologic consult.   Check hemoglobin A1c and lipid panel.  Continue aspirin and statin for now.  * Diabetes   Keep on giving Friday and keep  sliding scale coverage.  * UTI   IV ceftriaxone, check urine culture.  * Hypertension   Continue home medications, stable.  * Hyperlipidemia   Check lipid panel and continue simvastatin for now.      All the records are reviewed and case discussed with ED provider. Management plans discussed with the patient, family and they are in agreement.  CODE STATUS:Full code  Code Status History    This patient does not have a recorded code status. Please follow your organizational policy for patients in this situation.     Patient's 2 sons who are power of attorney are present in the room during my visit.  TOTAL TIME TAKING CARE OF THIS PATIENT:50  minutes.    Vaughan Basta M.D on 01/02/2017   Between 7am to 6pm - Pager - [phone]  After 6pm go to [email or web address] - password [email or web address] [facility]  Office  [phone]  CC: Primary care physician; Wilhemena Durie, MD   Note: This dictation was prepared with Dragon dictation along with smaller phrase technology. Any transcriptional errors that result from this process are unintentional.

## 2017-01-02 NOTE — ED Provider Notes (Signed)
[name]  ____________________________________________   First MD Initiated Contact with Patient 01/02/17 1733     (approximate)  I have reviewed the triage vital signs and the nursing notes.   HISTORY  Chief Complaint Hyperglycemia and Altered Mental Status  History obtained primarily from the patient's son is at bedside that she is confused.  HPI Lori Berger is a 76 y.o. female who comes to the emergency department with altered mental status and hyperglycemia. She went to church as normal this morning however wall there she began to feel lightheaded and somewhat confused. She did not slur her speech and she did not syncopized. She has had no focal numbness or weakness. Family took her to the fire department for a check and her blood sugar was high so they came to the emergency department for further evaluation. According to the son the patient is not taking her diabetic medication as prescribed taking it only once a day instead of twice.Further history is limited by the patient's confusion. Her dizziness began at 11 AM.   Past Medical History:  Diagnosis Date  . Arthritis   . BCC (basal cell carcinoma)   . Breast cancer ([place]) 1990   left 1990  . Breast cancer ([place]) 11/11/2015   Right, T2 (2.4 cm(, N0; positive deep margin, ER: 100%, PR: 60%; Her 2 neu not amplified, Mammoprint: low risk  . Diabetes mellitus without complication ([place])   . Endometrial cancer ([place])   . Hyperlipidemia   . Hypertension   . Major depressive disorder   . PONV (postoperative nausea and vomiting)     Patient Active Problem List   Diagnosis Date Noted  . Personal history of malignant neoplasm of breast 06/29/2016  . Malignant neoplasm of right female breast ([place]) 03/30/2016  . Loss of weight 03/30/2016  . Faintness 01/26/2016  . Osteopenia 01/26/2016  . Breast cancer, female, right 09/26/2015  . Basal cell carcinoma of face  02/07/2015  . Diabetes mellitus, type 2 ([place]) 02/07/2015  . Dysfunction of eustachian tube 02/07/2015  . 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 Malignant neoplasm of corpus uteri, except isthmus ([place]) 02/07/2015  . Polyp of corpus uteri 02/07/2015  . Essential (primary) hypertension 02/07/2015  . Need for prophylactic hormone replacement therapy (postmenopausal) 02/07/2015  . Decreased potassium in the blood 02/07/2015  . 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 Malaise and fatigue 02/07/2015  . Combined fat and carbohydrate induced hyperlipemia 02/07/2015  . Adiposity 02/07/2015  . Arthritis, degenerative 02/07/2015  . Allergic rhinitis 02/07/2015  . Postmenopausal bleeding 02/07/2015  . Psychosis 02/07/2015  . Inflammation of sacroiliac joint ([place]) 02/07/2015    Past Surgical History:  Procedure Laterality Date  . ABDOMINAL HYSTERECTOMY    . MASTECTOMY Left    1990  . SENTINEL NODE BIOPSY Right 11/11/2015   Procedure: SENTINEL NODE BIOPSY;  Surgeon: Robert Bellow, MD;  Location: [facility] ORS;  Service: General;  Laterality: Right;  . SIMPLE MASTECTOMY WITH AXILLARY SENTINEL NODE BIOPSY Right 11/11/2015   Procedure: SIMPLE MASTECTOMY;  Surgeon: Robert Bellow, MD;  Location: [facility] ORS;  Service: General;  Laterality: Right;    Prior to Admission medications   Medication Sig Start Date End Date Taking? Authorizing Provider  amLODipine (NORVASC) 5 MG tablet TAKE 1 TABLET BY MOUTH ONCE A DAY 12/06/16   Richard Maceo Pro., MD  Calcium Carb-Cholecalciferol (CALCIUM 600+D) 600-800 MG-UNIT TABS Take 1 tablet by mouth daily.  Historical Provider, MD  glimepiride (AMARYL) 4 MG tablet TAKE 1 TABLET BY MOUTH ONCE A DAY 11/07/15   Richard Maceo Pro., MD  Ibuprofen (ADVIL PO) Take by mouth as needed.    Historical Provider, MD  INVOKAMET 150-500 MG TABS TAKE 1 TABLET BY MOUTH TWICE A DAY 04/22/16   Jerrol Banana., MD  letrozole [facility]) 2.5 MG tablet Take 1  tablet (2.5 mg total) by mouth daily. 12/31/15   Robert Bellow, MD  lisinopril (PRINIVIL,ZESTRIL) 20 MG tablet TAKE 1 TABLET BY MOUTH ONCE A DAY 11/19/15   Richard Maceo Pro., MD  simvastatin (ZOCOR) 10 MG tablet Take 1 tablet (10 mg total) by mouth at bedtime. 10/25/16   Richard Maceo Pro., MD    Allergies Penicillins  Family History  Problem Relation Age of Onset  . Heart disease Mother     Fatal MI age 33  . Stroke Father     fatal age 70  . Cancer Sister     Colon  . Cancer Brother     Died from Kidney cancer  . Breast cancer Neg Hx     Social History Social History  Substance Use Topics  . Smoking status: Never Smoker  . Smokeless tobacco: Never Used  . Alcohol use No    Review of Systems Level V exemption history Limited by the patient's altered mental status 10-point ROS otherwise negative.  ____________________________________________   PHYSICAL EXAM:  VITAL SIGNS: ED Triage Vitals [01/02/17 1419]  Enc Vitals Group     BP (!) 150/88     Pulse Rate 83     Resp 16     Temp 98.1 F (36.7 C)     Temp Source Oral     SpO2 100 %     Weight 180 lb (81.6 kg)     Height 5\' 5"  (1.651 m)     Head Circumference      Peak Flow      Pain Score 0     Pain Loc      Pain Edu?      Excl. in [place]?     Constitutional:Pleasant, cooperative, well appearing Eyes: PERRL EOMI. Head: Atraumatic. Nose: No congestion/rhinnorhea. Mouth/Throat: No trismus Neck: No stridor.   Cardiovascular: Normal rate, regular rhythm. Grossly normal heart sounds.  Good peripheral circulation. Respiratory: Normal respiratory effort.  No retractions. Lungs CTAB and moving good air Gastrointestinal: Soft nondistended nontender no rebound no guarding no peritonitis no McBurney's tenderness negative Rovsing's no costovertebral tenderness negative Murphy's Musculoskeletal: No lower extremity edema   Neurologic: CN2-12 intact EOMI no nystagmus Normal FNF No pronator drift 5/5 grips,  biceps, triceps, hf he kf ke pf df SILT throughout Skin:  Skin is warm, dry and intact. No rash noted. Psychiatric: Mood and affect are normal. Speech and behavior are normal.    ____________________________________________   DIFFERENTIAL  Hyperglycemia, stroke, TIA, metabolic derangement ____________________________________________   LABS (all labs ordered are listed, but only abnormal results are displayed)  Labs Reviewed  COMPREHENSIVE METABOLIC PANEL - Abnormal; Notable for the following:       Result Value   CO2 19 (*)    Glucose, Bld 355 (*)    BUN 27 (*)    Creatinine, Ser 1.15 (*)    ALT 13 (*)    GFR calc non Af Amer 45 (*)    GFR calc Af Amer 52 (*)    All other components within normal limits  URINALYSIS, COMPLETE (  UACMP) WITH MICROSCOPIC - Abnormal; Notable for the following:    Color, Urine STRAW (*)    APPearance HAZY (*)    Glucose, UA >=500 (*)    Hgb urine dipstick [place] (*)    Ketones, ur 5 (*)    Leukocytes, UA MODERATE (*)    Squamous Epithelial / LPF 0-5 (*)    All other components within normal limits  GLUCOSE, CAPILLARY - Abnormal; Notable for the following:    Glucose-Capillary 344 (*)    All other components within normal limits  GLUCOSE, CAPILLARY - Abnormal; Notable for the following:    Glucose-Capillary 232 (*)    All other components within normal limits  CBC  CBG MONITORING, ED    Elevated glucose with no e/o DKA __________________________________________  EKG    ____________________________________________  RADIOLOGY  HCT c/w acute infarct ____________________________________________   PROCEDURES  Procedure(s) performed: no  Procedures   Critical Care performed: yes  CRITICAL CARE Performed by: Darel Hong   Total critical care time: 35 minutes  Critical care time was exclusive of separately billable procedures and treating other patients.  Critical care was necessary to treat or prevent imminent or  life-threatening deterioration.  Critical care was time spent personally by me on the following activities: development of treatment plan with patient and/or surrogate as well as nursing, discussions with consultants, evaluation of patient's response to treatment, examination of patient, obtaining history from patient or surrogate, ordering and performing treatments and interventions, ordering and review of laboratory studies, ordering and review of radiographic studies, pulse oximetry and re-evaluation of patient's condition.   ____________________________________________   INITIAL IMPRESSION / ASSESSMENT AND PLAN / ED COURSE  Pertinent labs & imaging results that were available during my care of the patient were reviewed by me and considered in my medical decision making (see chart for details).  The patient's acute confusion and dizziness is worrisome for more than just hyperglyemia.  Head CT pending.    ----------------------------------------- 6:20 PM on 01/02/2017 -----------------------------------------  Head CT is read as an acute to subacute infarct in the left PCA distribution. She has normal cerebellar function at this time. CT angiogram pending a code stroke called.  ____________________________________________  9562: CTA shows L P3 occlusion.  I'm touching base with [facility] regarding possible endovascular intervention.  ----------------------------------------- 7:28 PM on 01/02/2017 -----------------------------------------  I discussed the case with Dr. Cheral Marker of [facility] neurology and he indicated that given the patient's low NIH scale and how distal the lesion is she would not be a candidate for endovascular intervention in her stroke workup could be completed at our facility.  FINAL CLINICAL IMPRESSION(S) / ED DIAGNOSES  Final diagnoses:  None      NEW MEDICATIONS STARTED DURING THIS VISIT:  New Prescriptions   No medications on file     Note:   This document was prepared using Dragon voice recognition software and may include unintentional dictation errors.     Darel Hong, MD 01/03/17 [id number]

## 2017-01-02 NOTE — ED Notes (Signed)
AAOx3.  Skin warm and dry. Sons remain with patient.  Patient denies complaint.  Continue to monitor.

## 2017-01-02 NOTE — ED Notes (Signed)
Patient transported to CT 

## 2017-01-03 ENCOUNTER — Inpatient Hospital Stay (HOSPITAL_COMMUNITY)
Admit: 2017-01-03 | Discharge: 2017-01-03 | Disposition: A | Discharge status: Home / Self Care | Payer: Medicare Other | Attending: Internal Medicine | Admitting: Internal Medicine

## 2017-01-03 ENCOUNTER — Inpatient Hospital Stay: Payer: Medicare Other

## 2017-01-03 ENCOUNTER — Telehealth: Payer: Self-pay | Admitting: Family Medicine

## 2017-01-03 DIAGNOSIS — I639 Cerebral infarction, unspecified: Secondary | ICD-10-CM

## 2017-01-03 LAB — BASIC METABOLIC PANEL
Anion gap: 8 (ref 5–15)
BUN: 22 mg/dL — AB (ref 6–20)
CO2: 22 mmol/L (ref 22–32)
CREATININE: 0.82 mg/dL (ref 0.44–1.00)
Calcium: 8.9 mg/dL (ref 8.9–10.3)
Chloride: 109 mmol/L (ref 101–111)
Glucose, Bld: 186 mg/dL — ABNORMAL HIGH (ref 65–99)
POTASSIUM: 3.3 mmol/L — AB (ref 3.5–5.1)
SODIUM: 139 mmol/L (ref 135–145)

## 2017-01-03 LAB — GLUCOSE, CAPILLARY
GLUCOSE-CAPILLARY: 185 mg/dL — AB (ref 65–99)
GLUCOSE-CAPILLARY: 221 mg/dL — AB (ref 65–99)
Glucose-Capillary: 220 mg/dL — ABNORMAL HIGH (ref 65–99)
Glucose-Capillary: 165 mg/dL — ABNORMAL HIGH (ref 65–99)

## 2017-01-03 LAB — CBC
HEMATOCRIT: 37 % (ref 35.0–47.0)
Hemoglobin: 12.8 g/dL (ref 12.0–16.0)
MCH: 32.5 pg (ref 26.0–34.0)
MCHC: 34.6 g/dL (ref 32.0–36.0)
MCV: 94 fL (ref 80.0–100.0)
PLATELETS: 167 10*3/uL (ref 150–440)
RBC: 3.94 MIL/uL (ref 3.80–5.20)
RDW: 13.1 % (ref 11.5–14.5)
WBC: 8.2 10*3/uL (ref 3.6–11.0)

## 2017-01-03 LAB — ECHOCARDIOGRAM COMPLETE
Height: 64 in
Weight: 3011.2 oz

## 2017-01-03 MED ORDER — INSULIN ASPART 100 UNIT/ML ~~LOC~~ SOLN
0.0000 [IU] | Freq: Three times a day (TID) | SUBCUTANEOUS | Status: DC
  Administered 2017-01-03: 3 [IU] via SUBCUTANEOUS
  Administered 2017-01-03: 5 [IU] via SUBCUTANEOUS
  Administered 2017-01-03 – 2017-01-04 (×3): 3 [IU] via SUBCUTANEOUS
  Administered 2017-01-04: 18:00:00 2 [IU] via SUBCUTANEOUS
  Administered 2017-01-05 (×2): 5 [IU] via SUBCUTANEOUS
  Administered 2017-01-05: 3 [IU] via SUBCUTANEOUS
  Administered 2017-01-06: 8 [IU] via SUBCUTANEOUS
  Administered 2017-01-06: 5 [IU] via SUBCUTANEOUS
  Filled 2017-01-03: qty 8
  Filled 2017-01-03: qty 3
  Filled 2017-01-03 (×2): qty 5
  Filled 2017-01-03 (×2): qty 3
  Filled 2017-01-03: qty 2
  Filled 2017-01-03: qty 3
  Filled 2017-01-03: qty 5
  Filled 2017-01-03: qty 3
  Filled 2017-01-03: qty 5

## 2017-01-03 MED ORDER — HYDRALAZINE HCL 20 MG/ML IJ SOLN
10.0000 mg | Freq: Four times a day (QID) | INTRAMUSCULAR | Status: DC | PRN
  Filled 2017-01-03: qty 0.5

## 2017-01-03 MED ORDER — POTASSIUM CHLORIDE CRYS ER 20 MEQ PO TBCR
40.0000 meq | EXTENDED_RELEASE_TABLET | Freq: Once | ORAL | Status: AC
  Administered 2017-01-03: 09:00:00 40 meq via ORAL
  Filled 2017-01-03: qty 2

## 2017-01-03 MED ORDER — TUBERCULIN PPD 5 UNIT/0.1ML ID SOLN
5.0000 [IU] | Freq: Once | INTRADERMAL | Status: AC
  Administered 2017-01-03: 5 [IU] via INTRADERMAL
  Filled 2017-01-03: qty 0.1

## 2017-01-03 MED ORDER — ASPIRIN 325 MG PO TABS
325.0000 mg | ORAL_TABLET | Freq: Every day | ORAL | Status: DC
  Administered 2017-01-03 – 2017-01-04 (×2): 325 mg via ORAL
  Filled 2017-01-03 (×2): qty 1

## 2017-01-03 MED ORDER — ASPIRIN 81 MG PO CHEW: 81.0000 mg | CHEWABLE_TABLET | Freq: Every day | ORAL | 0 refills | Status: DC

## 2017-01-03 MED ORDER — CLOPIDOGREL BISULFATE 75 MG PO TABS: 75.0000 mg | ORAL_TABLET | Freq: Every day | ORAL | 0 refills | Status: DC

## 2017-01-03 MED ORDER — CEPHALEXIN 500 MG PO CAPS: 500.0000 mg | ORAL_CAPSULE | Freq: Three times a day (TID) | ORAL | 0 refills | Status: DC

## 2017-01-03 NOTE — Telephone Encounter (Signed)
That's fine, however due to my location I am unable to schedule her today. Someone will have to schedule her today, or wait for me to return tomorrow. It don't matter to me! -MM

## 2017-01-03 NOTE — Telephone Encounter (Signed)
Between CPE [email or web address]

## 2017-01-03 NOTE — Progress Notes (Signed)
Alert patient, disoriented to year currently. Denies pain on admission. Pt oriented to room, call light, and fall risk. Pt verbalized understanding. Admission profile and education completed with family at bedside, all questions answered. Confirmed patient requests to be DNR.  Cardiac monitor verified, NSR. Neuro assessment completed with no appearant weaknesses. Pt is slightly forgetful. NIH is "0". Pt up to BR with 1 assist. Pt is unsteady, attempts to hold on to objects while ambulating. Pt is pale with an eccymotic abrasion to left upper buttocks. MASD to abdominal folds was cleaned with warm soapy water and then antifungal powder applied. Pt instructed to call for assistance getting out of bed. Pt verbalizes understanding.

## 2017-01-03 NOTE — Telephone Encounter (Signed)
Please call for transition of care-aa

## 2017-01-03 NOTE — Progress Notes (Signed)
[facility] [place]    MR#:  [id number]  DATE OF BIRTH:  Nov 05, 1940  SUBJECTIVE:   Patient presented with ataxia and confusion. She reports ataxia has improved. CT scan was positive for CVA.  REVIEW OF SYSTEMS:    Review of Systems  Constitutional: Negative.  Negative for chills, fever and malaise/fatigue.  HENT: Negative.  Negative for ear discharge, ear pain, hearing loss, nosebleeds and sore throat.   Eyes: Negative.  Negative for blurred vision and pain.  Respiratory: Negative.  Negative for cough, hemoptysis, shortness of breath and wheezing.   Cardiovascular: Negative.  Negative for chest pain, palpitations and leg swelling.  Gastrointestinal: Negative.  Negative for abdominal pain, blood in stool, diarrhea, nausea and vomiting.  Genitourinary: Negative.  Negative for dysuria.  Musculoskeletal: Negative.  Negative for back pain.  Skin: Negative.   Neurological: Negative for dizziness, tremors, speech change, focal weakness, seizures and headaches.  Endo/Heme/Allergies: Negative.  Does not bruise/bleed easily.  Psychiatric/Behavioral: Negative.  Negative for depression, hallucinations and suicidal ideas.    Tolerating Diet: yes      DRUG ALLERGIES:   Allergies  Allergen Reactions  . Penicillins     VITALS:  Blood pressure (!) 142/59, pulse 86, temperature 98.2 F (36.8 C), temperature source Oral, resp. rate 18, height 5\' 4"  (1.626 m), weight 85.4 kg (188 lb 3.2 oz), SpO2 97 %.  PHYSICAL EXAMINATION:   Physical Exam    LABORATORY PANEL:   CBC  Recent Labs Lab 01/03/17 0447  WBC 8.2  HGB 12.8  HCT 37.0  PLT 167   ------------------------------------------------------------------------------------------------------------------  Chemistries   Recent Labs Lab 01/02/17 1423 01/03/17 0447  NA 139 139  K 3.9 3.3*  CL 109 109  CO2 19* 22  GLUCOSE 355* 186*  BUN 27* 22*  CREATININE  1.15* 0.82  CALCIUM 9.4 8.9  AST 17  --   ALT 13*  --   ALKPHOS 78  --   BILITOT 0.6  --    ------------------------------------------------------------------------------------------------------------------  Cardiac Enzymes No results for input(s): TROPONINI in the last 168 hours. ------------------------------------------------------------------------------------------------------------------  RADIOLOGY:  Ct Angio Head W Or Wo Contrast  Result Date: 01/02/2017 CLINICAL DATA:  Altered mental status.  Left PCA infarct on CT. EXAM: CT ANGIOGRAPHY HEAD AND NECK TECHNIQUE: Multidetector CT imaging of the head and neck was performed using the standard protocol during bolus administration of intravenous contrast. Multiplanar CT image reconstructions and MIPs were obtained to evaluate the vascular anatomy. Carotid stenosis measurements (when applicable) are obtained utilizing NASCET criteria, using the distal internal carotid diameter as the denominator. CONTRAST:  60 mL Isovue 370 COMPARISON:  Noncontrast head CT earlier today. No prior angiographic imaging. FINDINGS: CTA NECK FINDINGS Aortic arch: Standard 3 vessel aortic arch with mild atherosclerotic plaque. Widely patent brachiocephalic and subclavian arteries. Right carotid system: Patent without evidence of dissection or significant stenosis. Minimal soft plaque in the proximal ICA. Left carotid system: Patent without evidence of dissection or significant stenosis. Punctate focus of calcified plaque in the proximal ICA. Vertebral arteries: Patent without evidence of dissection or significant stenosis. The left vertebral artery is minimally larger than the right. Skeleton: Mild cervical spondylosis. Other neck: No mass or lymph node enlargement. Upper chest: Mild scarring of the lung apices. Review of the MIP images confirms the above findings CTA HEAD FINDINGS Anterior circulation: The internal carotid arteries are patent from skullbase to carotid  termini with mild siphon atherosclerosis but no  significant stenosis. There is a 1.5 mm left supraclinoid ICA aneurysm projecting inferomedially. There is also a 2 mm aneurysm projecting inferiorly from the right supraclinoid ICA at the same level. ACAs and MCAs are patent with mild branch vessel irregularity but no significant proximal stenosis. Posterior circulation: The intracranial vertebral arteries are widely patent to the basilar. Patent PICA and SCA origins are visualized bilaterally. The basilar artery is widely patent. There is a [place] right posterior communicating artery. The PCAs are patent without evidence of significant proximal stenosis on the right. There is a severe proximal left P2 stenosis, and there is left P3 occlusion associated with the infarct described on earlier CT. No aneurysm. Venous sinuses: Patent. Anatomic variants: None. Delayed phase: No abnormal enhancement. Review of the MIP images confirms the above findings IMPRESSION: 1. Severe proximal left P2 PCA stenosis.  Left P3 occlusion. 2. No significant anterior circulation stenosis. 3. No cervical carotid or vertebral artery stenosis. 4. 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 Electronically Signed   By: Logan Bores M.D.   On: 01/02/2017 19:13   Ct Head Wo Contrast  Result Date: 01/02/2017 CLINICAL DATA:  Initial evaluation for acute altered mental status. EXAM: CT HEAD WITHOUT CONTRAST TECHNIQUE: Contiguous axial images were obtained from the base of the skull through the vertex without intravenous contrast. COMPARISON:  None. FINDINGS: Brain: Diffuse prominence of the CSF containing spaces is compatible with generalized age-related cerebral atrophy. Patchy hypodensity within the periventricular and deep white matter both cerebral hemispheres most consistent with chronic microvascular ischemic disease, moderate in nature. Multifocal areas of encephalomalacia involving the bilateral frontal lobes likely related to remote  ischemic infarcts. Additional remote right cerebellar infarct noted. There is geographic hypodensity involving the parasagittal left occipital lobe, compatible with acute/early subacute left PCA territory infarct (series 2, image 13). No associated mass effect or hemorrhage. No other evidence for acute intracranial hemorrhage. No other large vessel territory infarct. No mass lesion, midline shift or mass effect. Diffuse ventricular prominence related to global parenchymal volume loss without hydrocephalus. No extra-axial fluid collection. Vascular: No asymmetric hyperdense vessel. Scattered vascular calcifications noted within the carotid siphons and vertebrobasilar system. Skull: Scalp soft tissues within normal limits.  Calvarium intact. Sinuses/Orbits: Globes and orbital soft tissues within normal limits. Paranasal sinuses and mastoid air cells are clear. Other: None. IMPRESSION: 1. Findings consistent with acute/early subacute left PCA territory infarct. No associated hemorrhage or mass effect. 2. Additional scattered remote bifrontal and right cerebellar ischemic infarcts. 3. Generalized age-related cerebral atrophy with moderate chronic microvascular ischemic disease. Electronically Signed   By: Jeannine Boga M.D.   On: 01/02/2017 18:09   Ct Angio Neck W And/or Wo Contrast  Result Date: 01/02/2017 CLINICAL DATA:  Altered mental status.  Left PCA infarct on CT. EXAM: CT ANGIOGRAPHY HEAD AND NECK TECHNIQUE: Multidetector CT imaging of the head and neck was performed using the standard protocol during bolus administration of intravenous contrast. Multiplanar CT image reconstructions and MIPs were obtained to evaluate the vascular anatomy. Carotid stenosis measurements (when applicable) are obtained utilizing NASCET criteria, using the distal internal carotid diameter as the denominator. CONTRAST:  60 mL Isovue 370 COMPARISON:  Noncontrast head CT earlier today. No prior angiographic imaging. FINDINGS:  CTA NECK FINDINGS Aortic arch: Standard 3 vessel aortic arch with mild atherosclerotic plaque. Widely patent brachiocephalic and subclavian arteries. Right carotid system: Patent without evidence of dissection or significant stenosis. Minimal soft plaque in the proximal ICA. Left carotid system: Patent without evidence of dissection  or significant stenosis. Punctate focus of calcified plaque in the proximal ICA. Vertebral arteries: Patent without evidence of dissection or significant stenosis. The left vertebral artery is minimally larger than the right. Skeleton: Mild cervical spondylosis. Other neck: No mass or lymph node enlargement. Upper chest: Mild scarring of the lung apices. Review of the MIP images confirms the above findings CTA HEAD FINDINGS Anterior circulation: The internal carotid arteries are patent from skullbase to carotid termini with mild siphon atherosclerosis but no significant stenosis. There is a 1.5 mm left supraclinoid ICA aneurysm projecting inferomedially. There is also a 2 mm aneurysm projecting inferiorly from the right supraclinoid ICA at the same level. ACAs and MCAs are patent with mild branch vessel irregularity but no significant proximal stenosis. Posterior circulation: The intracranial vertebral arteries are widely patent to the basilar. Patent PICA and SCA origins are visualized bilaterally. The basilar artery is widely patent. There is a [place] right posterior communicating artery. The PCAs are patent without evidence of significant proximal stenosis on the right. There is a severe proximal left P2 stenosis, and there is left P3 occlusion associated with the infarct described on earlier CT. No aneurysm. Venous sinuses: Patent. Anatomic variants: None. Delayed phase: No abnormal enhancement. Review of the MIP images confirms the above findings IMPRESSION: 1. Severe proximal left P2 PCA stenosis.  Left P3 occlusion. 2. No significant anterior circulation stenosis. 3. No cervical  carotid or vertebral artery stenosis. 4. 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 Electronically Signed   By: Logan Bores M.D.   On: 01/02/2017 19:13     ASSESSMENT AND PLAN:   76 year old female with a history of diabetes and essential hypertension who presented with altered mental status and found to have hyperglycemia and acute/early subacute left PCA territory infarct.  1. acute/early subacute left PCA territory infarct:  Patient is to undergo MRI and echocardiogram CTA shows  1. Severe proximal left P2 PCA stenosis.  Left P3 occlusion. 2. No significant anterior circulation stenosis. 3. No cervical carotid or vertebral artery stenosis. 4. 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   Follow up on neurology consultation Continue aspirin and statin LDL 60 Follow-up on PT and OT consult  2. Uncontrolled diabetes: Diabetes coordinator consultation Follow up on hemoglobin A1c Continue sliding scale insulin and Amaryl and ADA diet Consider adding Lantus or Levemir while in the hospital  3. Essential hypertension: Due to problem #1 will allow brain perfusion and hold hypertensive medications.   4. Urinary tract infection: Continue Rocephin and follow up on urine culture  5. Hypokalemia: Replete and recheck in a.m.    Management plans discussed with the patient and she is in agreement.  CODE STATUS: full  TOTAL TIME TAKING CARE OF THIS PATIENT: 30 minutes.     POSSIBLE D/C 1-2 days, DEPENDING ON CLINICAL CONDITION.   Naitik Hermann M.D on 01/03/2017 at 8:35 AM  Between 7am to 6pm - Pager - [phone] After 6pm go to [email or web address] - password [email or web address] [facility]  Office  [phone]  CC: Primary care physician; Wilhemena Durie, MD  Note: This dictation was prepared with Dragon dictation along with smaller phrase technology. Any transcriptional errors that result from this process are unintentional.

## 2017-01-03 NOTE — Telephone Encounter (Signed)
Dr Rosanna Randy please review on when we can see patient-aa

## 2017-01-03 NOTE — Telephone Encounter (Signed)
lmtcb - need to make hospital f/u appointment and do transition of care=-aa

## 2017-01-03 NOTE — Evaluation (Signed)
Physical Therapy Evaluation Patient Details Name: Lori Berger MRN: [id number] DOB: Mar 18, 1941 Today's Date: 01/03/2017   History of Present Illness  Pt is a 76 yo female presented to [facility] after experiencing some confusion and imbalance while at church. 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 PMH includes; breast CA, endometrial CA, HLD, and HTN.     Clinical Impression  Pt A&Ox4 and displayed good communication and safety awareness throughout evaluation. Pt lives alone at baseline in a two story home but has been living on the main level for the past 6 months due to increased bilat knee pain when ascending and descending stairs. She normally is independent in all ADLs and does not use an AD for ambulation, her son and daughter in law live next door and check in on her frequently.  On evaluation, pt displayed intact light touch sensation and intact coordination with finger to nose test. Overall strength appears symmetrical and WFLs for tasks assessed. She is able to safely perform bed mobility and transfers; but appears unsteady on her feet, requiring use of a RW. Pt was able to ambulate around the nursing station with a step through gait pattern but takes [place] steps and appears somewhat apprehensive. Attempted ambulating w/o an AD and she displayed increased lateral swaying and a narrow BOS, required min guarding for safety. Overall pt appears safe for basic household mobility tasks, but will require use of a RW and occasional supervision to ensure safety during functional tasks. She will also benefit from skilled PT to correct balance and ambulatory deficits above and return to her PLOF, recommend HHPT following acute hospitalization.      Follow Up Recommendations Home health PT;Supervision - Intermittent    Equipment Recommendations  Rolling walker with 5" wheels    Recommendations for Other Services       Precautions /  Restrictions Precautions Precautions: Fall Restrictions Weight Bearing Restrictions: No      Mobility  Bed Mobility Overal bed mobility: Modified Independent             General bed mobility comments: able to safely transfe in and OOB w/ increased time  Transfers Overall transfer level: Modified independent Equipment used: Rolling walker (2 wheeled)             General transfer comment: use of bilat UEs for push off into standing, demonstrates good safe technique w/o need for verbal cues  Ambulation/Gait Ambulation/Gait assistance: Min guard Ambulation Distance (Feet): 200 Feet Assistive device: Rolling walker (2 wheeled);None Gait Pattern/deviations: Step-through pattern;Decreased stride length Gait velocity: decreased but safe for household mobility Gait velocity interpretation: Below normal speed for age/gender General Gait Details: pt ambulates w/ a step through gait pattern but demonstrates decreased cadence and stride length, attempted ambulating w/o AD and patient appeared unsteady w/ increased lateral swaying   Stairs            Wheelchair Mobility    Modified Rankin (Stroke Patients Only)       Balance Overall balance assessment: Needs assistance Sitting-balance support: No upper extremity supported;Feet supported Sitting balance-Leahy Scale: Good Sitting balance - Comments: able to maintain upright seated posture w/o back support   Standing balance support: Bilateral upper extremity supported;During functional activity Standing balance-Leahy Scale: Fair Standing balance comment: displays improved standing stability w/ RW during ambulation, some minimal lateral swaying noted w/o AD in static standing  Pertinent Vitals/Pain Pain Assessment: No/denies pain    Home Living Family/patient expects to be discharged to:: Private residence Living Arrangements: Alone Available Help at Discharge:  Family;Available PRN/intermittently (son and daughter in law live next door and frequently check in on the patient) Type of Home: House Home Access: Stairs to enter Entrance Stairs-Rails: None Entrance Stairs-Number of Steps: 1 Home Layout: Two level;Able to live on main level with bedroom/bathroom Home Equipment: Walker - standard Additional Comments: pt rarely goes up to the second floor, says she sleeps in her den and does not normally use any AD     Prior Function Level of Independence: Independent               Hand Dominance   Dominant Hand: Right    Extremity/Trunk Assessment   Upper Extremity Assessment Upper Extremity Assessment: Overall WFL for tasks assessed    Lower Extremity Assessment Lower Extremity Assessment: Overall WFL for tasks assessed       Communication   Communication: No difficulties  Cognition Arousal/Alertness: Awake/alert Behavior During Therapy: WFL for tasks assessed/performed Overall Cognitive Status: Within Functional Limits for tasks assessed                                        General Comments      Exercises     Assessment/Plan    PT Assessment Patient needs continued PT services  PT Problem List Decreased balance;Decreased mobility;Decreased knowledge of use of DME       PT Treatment Interventions DME instruction;Gait training;Stair training;Functional mobility training;Balance training;Therapeutic exercise;Therapeutic activities;Patient/family education;Neuromuscular re-education    PT Goals (Current goals can be found in the Care Plan section)  Acute Rehab PT Goals Patient Stated Goal: Return home safely PT Goal Formulation: With patient Time For Goal Achievement: 01/17/17 Potential to Achieve Goals: Good    Frequency 7X/week   Barriers to discharge        Co-evaluation               End of Session Equipment Utilized During Treatment: Gait belt Activity Tolerance: Patient tolerated  treatment well Patient left: in chair;with call bell/phone within reach;with chair alarm set;with family/visitor present Nurse Communication: Mobility status PT Visit Diagnosis: Unsteadiness on feet (R26.81);Difficulty in walking, not elsewhere classified (R26.2)    Time: 4356-8616 PT Time Calculation (min) (ACUTE ONLY): 24 min   Charges:         PT G Codes:        [employer] Student PT 01/03/17, 12:39 PM [phone]   Pollyanna Levay 01/03/2017, 12:37 PM

## 2017-01-03 NOTE — Progress Notes (Signed)
[facility] responded to an OR for Prayer. Pt was lying on the bed and Two sons, daughter-in-law and grandson were bedside. Pt stated she was at church when she stated feeling unwell. A church member contacted Pt's family who were at the church for [employer]. Pt was brought to hospital and stated she was feeling much better today than yesterday. Pt requested prayers for thanksgiving, which the [facility] offered with the ministry of presence. [facility] informed the Pt that [facility] services were available as needed.     01/03/17 1300  Clinical Encounter Type  Visited With Patient;Patient and family together  Visit Type Initial;Follow-up;Spiritual support  Referral From Nurse  Consult/Referral To Chaplain  Spiritual Encounters  Spiritual Needs Prayer;Other (Comment)

## 2017-01-03 NOTE — Consult Note (Signed)
Referring Physician: Mody    Chief Complaint: Weakness, confusion  HPI: Lori Berger is an 76 y.o. female who reports getting up to go to church and driving there just fine.   Had some confusion with setting up.  [facility] but was not [facility].  Patient refused to go to the hospital until after church.  Was found at home to have elevated BP and BS.  Unclear if patient was compliant with medications.  Had defecated twice in the floor.  Patient lives alone.   Initial NIHSS of 0.  Date last known well: Unable to determine Time last known well: Unable to determine tPA Given: No: Unable to determine LKW  Past Medical History:  Diagnosis Date  . Arthritis   . BCC (basal cell carcinoma)   . Breast cancer ([place]) 1990   left 1990  . Breast cancer ([place]) 11/11/2015   Right, T2 (2.4 cm(, N0; positive deep margin, ER: 100%, PR: 60%; Her 2 neu not amplified, Mammoprint: low risk  . Diabetes mellitus without complication ([place])   . Endometrial cancer ([place])   . Hyperlipidemia   . Hypertension   . Major depressive disorder   . PONV (postoperative nausea and vomiting)     Past Surgical History:  Procedure Laterality Date  . ABDOMINAL HYSTERECTOMY    . MASTECTOMY Left    1990  . SENTINEL NODE BIOPSY Right 11/11/2015   Procedure: SENTINEL NODE BIOPSY;  Surgeon: Robert Bellow, MD;  Location: [facility] ORS;  Service: General;  Laterality: Right;  . SIMPLE MASTECTOMY WITH AXILLARY SENTINEL NODE BIOPSY Right 11/11/2015   Procedure: SIMPLE MASTECTOMY;  Surgeon: Robert Bellow, MD;  Location: [facility] ORS;  Service: General;  Laterality: Right;    Family History  Problem Relation Age of Onset  . Heart disease Mother     Fatal MI age 78  . Stroke Father     fatal age 2  . Cancer Sister     Colon  . Cancer Brother     Died from Kidney cancer  . Breast cancer Neg Hx    Social History:  reports that she has never smoked. She has never used smokeless tobacco. She reports that she  does not drink alcohol or use drugs.  Allergies:  Allergies  Allergen Reactions  . Penicillins     Medications:  I have reviewed the patient's current medications. Prior to Admission:  Prescriptions Prior to Admission  Medication Sig Dispense Refill Last Dose  . amLODipine (NORVASC) 5 MG tablet TAKE 1 TABLET BY MOUTH ONCE A DAY 30 tablet 12 01/01/2017 at am  . Calcium Carb-Cholecalciferol (CALCIUM 600+D) 600-800 MG-UNIT TABS Take 1 tablet by mouth daily.   01/01/2017 at am  . glimepiride (AMARYL) 4 MG tablet TAKE 1 TABLET BY MOUTH ONCE A DAY 30 tablet 12 01/01/2017 at am  . INVOKAMET 150-500 MG TABS TAKE 1 TABLET BY MOUTH TWICE A DAY 60 tablet 11 01/02/2017 at am  . letrozole (FEMARA) 2.5 MG tablet Take 1 tablet (2.5 mg total) by mouth daily. 30 tablet 12 01/01/2017 at am  . lisinopril (PRINIVIL,ZESTRIL) 20 MG tablet TAKE 1 TABLET BY MOUTH ONCE A DAY 30 tablet 12 01/02/2017 at am  . Ibuprofen (ADVIL PO) Take by mouth as needed.   Not Taking at Unknown time  . simvastatin (ZOCOR) 10 MG tablet Take 1 tablet (10 mg total) by mouth at bedtime. (Patient not taking: Reported on 01/02/2017) 90 tablet 3 Not Taking at pm  Scheduled: . aspirin  325 mg Oral Daily  . calcium-vitamin D  1 tablet Oral Q breakfast  . cefTRIAXone (ROCEPHIN)  IV  1 g Intravenous Q24H  . glimepiride  4 mg Oral Daily  . heparin  5,000 Units Subcutaneous Q8H  . insulin aspart  0-15 Units Subcutaneous TID WC  . letrozole  2.5 mg Oral Daily  . simvastatin  10 mg Oral QHS  . sodium chloride flush  3 mL Intravenous Q12H  . tuberculin  5 Units Intradermal Once    ROS: History obtained from the patient and family  General ROS: negative for - chills, fatigue, fever, night sweats, weight gain or weight loss Psychological ROS: memory difficulties Ophthalmic ROS: negative for - blurry vision, double vision, eye pain or loss of vision ENT ROS: negative for - epistaxis, nasal discharge, oral lesions, sore throat, tinnitus or  vertigo Allergy and Immunology ROS: negative for - hives or itchy/watery eyes Hematological and Lymphatic ROS: negative for - bleeding problems, bruising or swollen lymph nodes Endocrine ROS: negative for - galactorrhea, hair pattern changes, polydipsia/polyuria or temperature intolerance Respiratory ROS: negative for - cough, hemoptysis, shortness of breath or wheezing Cardiovascular ROS: negative for - chest pain, dyspnea on exertion, edema or irregular heartbeat Gastrointestinal ROS: negative for - abdominal pain, diarrhea, hematemesis, nausea/vomiting or stool incontinence Genito-Urinary ROS: negative for - dysuria, hematuria, incontinence or urinary frequency/urgency Musculoskeletal ROS: arthritis pain Neurological ROS: as noted in HPI Dermatological ROS: negative for rash and skin lesion changes  Physical Examination: Blood pressure 139/90, pulse 85, temperature 98.2 F (36.8 C), temperature source Oral, resp. rate 18, height 5\' 4"  (1.626 m), weight 85.4 kg (188 lb 3.2 oz), SpO2 98 %.  HEENT-  Normocephalic, no lesions, without obvious abnormality.  Normal external eye and conjunctiva.  Normal TM's bilaterally.  Normal auditory canals and external ears. Normal external nose, mucus membranes and septum.  Normal pharynx. Cardiovascular- S1, S2 normal, pulses palpable throughout   Lungs- chest clear, no wheezing, rales, normal symmetric air entry Abdomen- soft, non-tender; bowel sounds normal; no masses,  no organomegaly Extremities- no edema Lymph-no adenopathy palpable Musculoskeletal-no joint tenderness, deformity or swelling Skin-warm and dry, no hyperpigmentation, vitiligo, or suspicious lesions  Neurological Examination   Mental Status: Alert, confused.  Speech fluent without evidence of aphasia.  Able to follow 3 step commands without difficulty. Cranial Nerves: II: Discs flat bilaterally; RHH, pupils equal, round, reactive to light and accommodation III,IV, VI: ptosis not  present, extra-ocular motions intact bilaterally V,VII: smile symmetric, facial light touch sensation normal bilaterally VIII: hearing normal bilaterally IX,X: gag reflex present XI: bilateral shoulder shrug XII: midline tongue extension Motor: Right : Upper extremity   5-/5    Left:     Upper extremity   5/5  Lower extremity   5/5     Lower extremity   5/5 Tone and bulk:normal tone throughout; no atrophy noted Sensory: Pinprick and light touch intact throughout, bilaterally Deep Tendon Reflexes: 2+ in the upper extremities and absent in the lower extremities Plantars: Right: downgoing   Left: downgoing Cerebellar: Normal finger-to-nose and normal heel-to-shin testing bilaterally Gait: not tested due to safety concerns    Laboratory Studies:  Basic Metabolic Panel:  Recent Labs Lab 01/02/17 1423 01/03/17 0447  NA 139 139  K 3.9 3.3*  CL 109 109  CO2 19* 22  GLUCOSE 355* 186*  BUN 27* 22*  CREATININE 1.15* 0.82  CALCIUM 9.4 8.9    Liver Function Tests:  Recent Labs  Lab 01/02/17 1423  AST 17  ALT 13*  ALKPHOS 78  BILITOT 0.6  PROT 7.0  ALBUMIN 3.9   No results for input(s): LIPASE, AMYLASE in the last 168 hours. No results for input(s): AMMONIA in the last 168 hours.  CBC:  Recent Labs Lab 01/02/17 1423 01/03/17 0447  WBC 9.0 8.2  HGB 13.8 12.8  HCT 39.9 37.0  MCV 92.6 94.0  PLT 192 167    Cardiac Enzymes: No results for input(s): CKTOTAL, CKMB, CKMBINDEX, TROPONINI in the last 168 hours.  BNP: Invalid input(s): POCBNP  CBG:  Recent Labs Lab 01/02/17 1427 01/02/17 1703 01/02/17 2316 01/03/17 0800 01/03/17 1150  GLUCAP 344* 232* 217* 220* 165*    Microbiology: Results for orders placed or performed in visit on 10/21/15  Urine Culture     Status: None   Collection Time: 10/21/15 11:49 AM  Result Value Ref Range Status   Urine Culture, Routine Final report  Final   Urine Culture result 1 No growth  Final    Coagulation  Studies: No results for input(s): LABPROT, INR in the last 72 hours.  Urinalysis:  Recent Labs Lab 01/02/17 1428  COLORURINE STRAW*  LABSPEC 1.028  PHURINE 5.0  GLUCOSEU >=500*  HGBUR Rickles*  BILIRUBINUR NEGATIVE  KETONESUR 5*  PROTEINUR NEGATIVE  NITRITE NEGATIVE  LEUKOCYTESUR MODERATE*    Lipid Panel:    Component Value Date/Time   CHOL 163 01/02/2017 1826   CHOL 249 (H) 10/19/2016 0955   TRIG 271 (H) 01/02/2017 1826   HDL 49 01/02/2017 1826   HDL 40 10/19/2016 0955   CHOLHDL 3.3 01/02/2017 1826   VLDL 54 (H) 01/02/2017 1826   LDLCALC 60 01/02/2017 1826   LDLCALC 136 (H) 10/19/2016 0955    HgbA1C:  Lab Results  Component Value Date   HGBA1C 9.2 (H) 10/19/2016    Urine Drug Screen:  No results found for: LABOPIA, COCAINSCRNUR, LABBENZ, AMPHETMU, THCU, LABBARB  Alcohol Level: No results for input(s): ETH in the last 168 hours.   Imaging: Ct Angio Head W Or Wo Contrast  Result Date: 01/02/2017 CLINICAL DATA:  Altered mental status.  Left PCA infarct on CT. EXAM: CT ANGIOGRAPHY HEAD AND NECK TECHNIQUE: Multidetector CT imaging of the head and neck was performed using the standard protocol during bolus administration of intravenous contrast. Multiplanar CT image reconstructions and MIPs were obtained to evaluate the vascular anatomy. Carotid stenosis measurements (when applicable) are obtained utilizing NASCET criteria, using the distal internal carotid diameter as the denominator. CONTRAST:  60 mL Isovue 370 COMPARISON:  Noncontrast head CT earlier today. No prior angiographic imaging. FINDINGS: CTA NECK FINDINGS Aortic arch: Standard 3 vessel aortic arch with mild atherosclerotic plaque. Widely patent brachiocephalic and subclavian arteries. Right carotid system: Patent without evidence of dissection or significant stenosis. Minimal soft plaque in the proximal ICA. Left carotid system: Patent without evidence of dissection or significant stenosis. Punctate focus of  calcified plaque in the proximal ICA. Vertebral arteries: Patent without evidence of dissection or significant stenosis. The left vertebral artery is minimally larger than the right. Skeleton: Mild cervical spondylosis. Other neck: No mass or lymph node enlargement. Upper chest: Mild scarring of the lung apices. Review of the MIP images confirms the above findings CTA HEAD FINDINGS Anterior circulation: The internal carotid arteries are patent from skullbase to carotid termini with mild siphon atherosclerosis but no significant stenosis. There is a 1.5 mm left supraclinoid ICA aneurysm projecting inferomedially. There is also a 2 mm aneurysm projecting  inferiorly from the right supraclinoid ICA at the same level. ACAs and MCAs are patent with mild branch vessel irregularity but no significant proximal stenosis. Posterior circulation: The intracranial vertebral arteries are widely patent to the basilar. Patent PICA and SCA origins are visualized bilaterally. The basilar artery is widely patent. There is a [place] right posterior communicating artery. The PCAs are patent without evidence of significant proximal stenosis on the right. There is a severe proximal left P2 stenosis, and there is left P3 occlusion associated with the infarct described on earlier CT. No aneurysm. Venous sinuses: Patent. Anatomic variants: None. Delayed phase: No abnormal enhancement. Review of the MIP images confirms the above findings IMPRESSION: 1. Severe proximal left P2 PCA stenosis.  Left P3 occlusion. 2. No significant anterior circulation stenosis. 3. No cervical carotid or vertebral artery stenosis. 4. 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 Electronically Signed   By: Logan Bores M.D.   On: 01/02/2017 19:13   Ct Head Wo Contrast  Result Date: 01/02/2017 CLINICAL DATA:  Initial evaluation for acute altered mental status. EXAM: CT HEAD WITHOUT CONTRAST TECHNIQUE: Contiguous axial images were obtained from the base of the  skull through the vertex without intravenous contrast. COMPARISON:  None. FINDINGS: Brain: Diffuse prominence of the CSF containing spaces is compatible with generalized age-related cerebral atrophy. Patchy hypodensity within the periventricular and deep white matter both cerebral hemispheres most consistent with chronic microvascular ischemic disease, moderate in nature. Multifocal areas of encephalomalacia involving the bilateral frontal lobes likely related to remote ischemic infarcts. Additional remote right cerebellar infarct noted. There is geographic hypodensity involving the parasagittal left occipital lobe, compatible with acute/early subacute left PCA territory infarct (series 2, image 13). No associated mass effect or hemorrhage. No other evidence for acute intracranial hemorrhage. No other large vessel territory infarct. No mass lesion, midline shift or mass effect. Diffuse ventricular prominence related to global parenchymal volume loss without hydrocephalus. No extra-axial fluid collection. Vascular: No asymmetric hyperdense vessel. Scattered vascular calcifications noted within the carotid siphons and vertebrobasilar system. Skull: Scalp soft tissues within normal limits.  Calvarium intact. Sinuses/Orbits: Globes and orbital soft tissues within normal limits. Paranasal sinuses and mastoid air cells are clear. Other: None. IMPRESSION: 1. Findings consistent with acute/early subacute left PCA territory infarct. No associated hemorrhage or mass effect. 2. Additional scattered remote bifrontal and right cerebellar ischemic infarcts. 3. Generalized age-related cerebral atrophy with moderate chronic microvascular ischemic disease. Electronically Signed   By: Jeannine Boga M.D.   On: 01/02/2017 18:09   Ct Angio Neck W And/or Wo Contrast  Result Date: 01/02/2017 CLINICAL DATA:  Altered mental status.  Left PCA infarct on CT. EXAM: CT ANGIOGRAPHY HEAD AND NECK TECHNIQUE: Multidetector CT imaging of  the head and neck was performed using the standard protocol during bolus administration of intravenous contrast. Multiplanar CT image reconstructions and MIPs were obtained to evaluate the vascular anatomy. Carotid stenosis measurements (when applicable) are obtained utilizing NASCET criteria, using the distal internal carotid diameter as the denominator. CONTRAST:  60 mL Isovue 370 COMPARISON:  Noncontrast head CT earlier today. No prior angiographic imaging. FINDINGS: CTA NECK FINDINGS Aortic arch: Standard 3 vessel aortic arch with mild atherosclerotic plaque. Widely patent brachiocephalic and subclavian arteries. Right carotid system: Patent without evidence of dissection or significant stenosis. Minimal soft plaque in the proximal ICA. Left carotid system: Patent without evidence of dissection or significant stenosis. Punctate focus of calcified plaque in the proximal ICA. Vertebral arteries: Patent without evidence of dissection or significant  stenosis. The left vertebral artery is minimally larger than the right. Skeleton: Mild cervical spondylosis. Other neck: No mass or lymph node enlargement. Upper chest: Mild scarring of the lung apices. Review of the MIP images confirms the above findings CTA HEAD FINDINGS Anterior circulation: The internal carotid arteries are patent from skullbase to carotid termini with mild siphon atherosclerosis but no significant stenosis. There is a 1.5 mm left supraclinoid ICA aneurysm projecting inferomedially. There is also a 2 mm aneurysm projecting inferiorly from the right supraclinoid ICA at the same level. ACAs and MCAs are patent with mild branch vessel irregularity but no significant proximal stenosis. Posterior circulation: The intracranial vertebral arteries are widely patent to the basilar. Patent PICA and SCA origins are visualized bilaterally. The basilar artery is widely patent. There is a [place] right posterior communicating artery. The PCAs are patent without  evidence of significant proximal stenosis on the right. There is a severe proximal left P2 stenosis, and there is left P3 occlusion associated with the infarct described on earlier CT. No aneurysm. Venous sinuses: Patent. Anatomic variants: None. Delayed phase: No abnormal enhancement. Review of the MIP images confirms the above findings IMPRESSION: 1. Severe proximal left P2 PCA stenosis.  Left P3 occlusion. 2. No significant anterior circulation stenosis. 3. No cervical carotid or vertebral artery stenosis. 4. 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 Electronically Signed   By: Logan Bores M.D.   On: 01/02/2017 19:13   Mr Brain Wo Contrast  Result Date: 01/03/2017 CLINICAL DATA:  PCA infarct. EXAM: MRI HEAD WITHOUT CONTRAST TECHNIQUE: Multiplanar, multiecho pulse sequences of the brain and surrounding structures were obtained without intravenous contrast. COMPARISON:  Head CT and CTA from yesterday FINDINGS: Brain: Moderate area of restricted diffusion in the left occipital lobe. Musial infarcts in the superior and posterior left thalamus and splenium of the corpus callosum. No acute infarct in a separate distribution. No signs of acute hemorrhage. Remote moderate infarcts in the bilateral inferior frontal/opercular region. Remote infarct in the peripheral inferior right cerebellum. No hemorrhage, hydrocephalus, or mass.  Age related volume loss. Vascular: Recent CTA.  Major flow voids are present. Skull and upper cervical spine: Negative Sinuses/Orbits: Negative IMPRESSION: 1. Acute left PCA territory infarct, moderate in the occipital lobe and mild in the thalamus. 2. Remote bilateral inferior frontal and right cerebellar infarcts. Electronically Signed   By: Monte Fantasia M.D.   On: 01/03/2017 10:19   US Carotid Bilateral  Result Date: 01/03/2017 CLINICAL DATA:  76 year old female with a history of cerebrovascular accident. Cardiovascular risk factors include hypertension, hyperlipidemia, diabetes  EXAM: BILATERAL CAROTID DUPLEX ULTRASOUND TECHNIQUE: Pearline Cables scale imaging, color Doppler and duplex ultrasound were performed of bilateral carotid and vertebral arteries in the neck. COMPARISON:  No prior duplex FINDINGS: Criteria: Quantification of carotid stenosis is based on velocity parameters that correlate the residual internal carotid diameter with NASCET-based stenosis levels, using the diameter of the distal internal carotid lumen as the denominator for stenosis measurement. The following velocity measurements were obtained: RIGHT ICA:  Systolic 55 cm/sec, Diastolic 12 cm/sec CCA:  75 cm/sec SYSTOLIC ICA/CCA RATIO:  0.7 ECA:  99 cm/sec LEFT ICA:  Systolic 50 cm/sec, Diastolic 14 cm/sec CCA:  62 cm/sec SYSTOLIC ICA/CCA RATIO:  0.8 ECA:  97 cm/sec Right Brachial SBP: Not acquired Left Brachial SBP: Not acquired RIGHT CAROTID ARTERY: No significant calcified disease of the right common carotid artery. Intermediate waveform maintained. Heterogeneous plaque without significant calcifications at the right carotid bifurcation. Low resistance waveform of the  right ICA. Mild tortuosity. RIGHT VERTEBRAL ARTERY: Antegrade flow with low resistance waveform. LEFT CAROTID ARTERY: No significant calcified disease of the left common carotid artery. Intermediate waveform maintained. Heterogeneous plaque at the left carotid bifurcation without significant calcifications. Low resistance waveform of the left ICA. Mild tortuosity LEFT VERTEBRAL ARTERY:  Antegrade flow with low resistance waveform. IMPRESSION: Color duplex indicates minimal heterogeneous plaque, with no hemodynamically significant stenosis by duplex criteria in the extracranial cerebrovascular circulation. Signed, Dulcy Fanny. Earleen Newport, DO [facility] Radiology Electronically Signed   By: Corrie Mckusick D.O.   On: 01/03/2017 11:00    Assessment: 76 y.o. female presenting with confusion.  Neurological examination is  significant for a [facility].  MRI of the brain reviewed and shows an acute left PCA infarct.  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  Patient on ASA at home.  Infarcts likely secondary to Martine vessel disease.  CTA shows severe left P2 disease and accluded P3.  Echocardiogram pending.  A1c pending, LDL 60 on a statin.  Stroke Risk Factors - diabetes mellitus, hyperlipidemia and hypertension  Plan: 1. HgbA1c pending 2. PT consult, OT consult, Speech consult 3. Echocardiogram pending 4. Prophylactic therapy-ASA 81mg , Plavix 75mg  daily 5. Telemetry monitoring 6. Frequent neuro checks  Case discussed with Dr. Fredirick Maudlin, MD Neurology [phone] 01/03/2017, 1:04 PM

## 2017-01-03 NOTE — Progress Notes (Addendum)
Inpatient Diabetes Program Recommendations  AACE/ADA: New Consensus Statement on Inpatient Glycemic Control (2015)  Target Ranges:  Prepandial:   less than 140 mg/dL      Peak postprandial:   less than 180 mg/dL (1-2 hours)      Critically ill patients:  140 - 180 mg/dL   Results for [name], Lori Berger (MRN [id number]) as of 01/03/2017 08:27  Ref. Range 01/02/2017 14:27 01/02/2017 17:03 01/02/2017 23:16  Glucose-Capillary Latest Ref Range: 65 - 99 mg/dL 344 (H) 232 (H) 217 (H)    Admit with: CVA/ UTI  History: DM  Home DM Meds: Amaryl 4 mg daily       Invokamet 150/500 mg BID  Current Insulin Orders: Novolog Moderate Correction Scale/ SSI (0-15 units) TID AC + HS      Amaryl 4 mg daily      MD- Note patient to start both Amaryl and Novolog SSI this AM.    Current A1c pending.    Patient saw her PCP (Dr. Rosanna [facility] with [facility]) on 10/19/16.  Hemoglobin A1c at that visit was 9.2%.  No changes were made to her DM medication regimen at that visit.  No recommendations yet since medications just started this AM.     --Will follow patient during hospitalization--  Wyn Quaker RN, MSN, CDE Diabetes Coordinator Inpatient Glycemic Control Team Team Pager: [phone] (8a-5p)

## 2017-01-03 NOTE — Care Management (Addendum)
Admitted to this facility with the diagnosis of stroke. Lives alone. Son is Ed Surveyor, quantity [phone]). Other son is Solicitor. Last seen Dr. Rosanna Randy in January 2018. Home Health in the past. Doesn't remember name of agency. No skilled nursing. No home oxygen. [facility] available, if needed. Takes care of all basic activities of daily living herself, drives. Prescriptions are filled at Drug store in [place]. "The only drug store in [place]." Good appetite. [place] prior to this admission. Family will transport. Physical therapy evaluation completed. Recommended home with home health and physical therapy. Discussed agencies. Chose. [facility]. Floydene Flock, [facility] representative updated. Discharge to home today per Dr. Benjie Karvonen. Shelbie Ammons RN MSN CCM Care Management

## 2017-01-03 NOTE — Telephone Encounter (Signed)
Pt was admitted on 01/02/17 with possible stroke, being discharged today (01/03/17)  Needs f/u in 7-10 days, please contact the patient with a work-in time.

## 2017-01-03 NOTE — Telephone Encounter (Signed)
Dr Rosanna Randy where would you like to work her in, she is been discharged today and needs to be seen 7 to 10 days. In between CPEs ok?-aa

## 2017-01-03 NOTE — Progress Notes (Signed)
*  PRELIMINARY RESULTS* Echocardiogram 2D Echocardiogram has been performed.  Sherrie Sport 01/03/2017, 2:22 PM

## 2017-01-03 NOTE — Discharge Summary (Addendum)
[place] [facility] [place] NAME: Lori Berger    MR#:  [id number]  DATE OF BIRTH:  03/23/1941  DATE OF ADMISSION:  01/02/2017 ADMITTING PHYSICIAN: Vaughan Basta, MD  DATE OF DISCHARGE: 01/06/2017  PRIMARY CARE PHYSICIAN: Wilhemena Durie, MD    ADMISSION DIAGNOSIS:  Cerebral infarction [facility]) [I63.9] CVA (cerebral infarction) [I63.9] Acute ischemic left PCA stroke ([place]) [I63.532]  DISCHARGE DIAGNOSIS:  Principal Problem:   Stroke [facility]) Active Problems:   Acute lower UTI   Stroke (cerebrum) ([place])  SECONDARY DIAGNOSIS:   Past Medical History:  Diagnosis Date  . Arthritis   . BCC (basal cell carcinoma)   . Breast cancer ([place]) 1990   left 1990  . Breast cancer ([place]) 11/11/2015   Right, T2 (2.4 cm(, N0; positive deep margin, ER: 100%, PR: 60%; Her 2 neu not amplified, Mammoprint: low risk  . Diabetes mellitus without complication ([place])   . Endometrial cancer ([place])   . Hyperlipidemia   . Hypertension   . Major depressive disorder   . PONV (postoperative nausea and vomiting)     HOSPITAL COURSE:  76 year old female with a history of diabetes and essential hypertension who presented with altered mental status and found to have hyperglycemia and acute/early subacute left PCA territory infarct.  1. Acute left PCA territory infarct:  Patient underwent stroke workup with MRI, carotid ultrasound and echocardiogram. MRI was consistent with left PCA territory infarct. Carotid Dopplers showed no hemodynamically significant stenosis ECHO shows no source of thrombus CTA head/neck shows 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 - being discharged on aspirin, Plavix and  statin  His therapy is recommending home health at discharge with supervision for the first few days  LDL 60 - I have discussed with neurologist, who is in agreement to keep her statin at current dose.  2.  Uncontrolled diabetes: continue with close follow-up by her PCP and her outpatient medications.  3. Essential hypertension  4. Urinary tract infection: treated, off abx 5. Hypokalemia: repleted  DISCHARGE CONDITIONS AND DIET:   Diabetic diet Stable for discharge  CONSULTS OBTAINED:  Treatment Team:  Alexis Goodell, MD  DRUG ALLERGIES:   Allergies  Allergen Reactions  . Penicillins     DISCHARGE MEDICATIONS:   Current Discharge Medication List    START taking these medications   Details  aspirin (ASPIRIN CHILDRENS) 81 MG chewable tablet Chew 1 tablet (81 mg total) by mouth daily. Qty: 120 tablet, Refills: 0    clopidogrel (PLAVIX) 75 MG tablet Take 1 tablet (75 mg total) by mouth daily. Qty: 30 tablet, Refills: 0      CONTINUE these medications which have CHANGED   Details  ibuprofen (ADVIL) 200 MG tablet Take 1 tablet (200 mg total) by mouth every 6 (six) hours as needed for moderate pain. Qty: 30 tablet, Refills: 0      CONTINUE these medications which have NOT CHANGED   Details  amLODipine (NORVASC) 5 MG tablet TAKE 1 TABLET BY MOUTH ONCE A DAY Qty: 30 tablet, Refills: 12    Calcium Carb-Cholecalciferol (CALCIUM 600+D) 600-800 MG-UNIT TABS Take 1 tablet by mouth daily.    glimepiride (AMARYL) 4 MG tablet TAKE 1 TABLET BY MOUTH ONCE A DAY Qty: 30 tablet, Refills: 12    INVOKAMET 150-500 MG TABS TAKE 1 TABLET BY MOUTH TWICE A DAY Qty: 60 tablet, Refills: 11    letrozole (FEMARA) 2.5 MG tablet Take 1 tablet (  2.5 mg total) by mouth daily. Qty: 30 tablet, Refills: 12    lisinopril (PRINIVIL,ZESTRIL) 20 MG tablet TAKE 1 TABLET BY MOUTH ONCE A DAY Qty: 30 tablet, Refills: 12    simvastatin (ZOCOR) 10 MG tablet Take 1 tablet (10 mg total) by mouth at bedtime. Qty: 90 tablet, Refills: 3          Today   CHIEF COMPLAINT:   doing better this am no ataxia   VITAL SIGNS:  Blood pressure 127/70, pulse 83, temperature 98 F (36.7 C), temperature  source Oral, resp. rate 18, height 5\' 4"  (1.626 m), weight 85.4 kg (188 lb 3.2 oz), SpO2 98 %.   REVIEW OF SYSTEMS:  Review of Systems  Constitutional: Negative.  Negative for chills, fever and malaise/fatigue.  HENT: Negative.  Negative for ear discharge, ear pain, hearing loss, nosebleeds and sore throat.   Eyes: Negative.  Negative for blurred vision and pain.  Respiratory: Negative.  Negative for cough, hemoptysis, shortness of breath and wheezing.   Cardiovascular: Negative.  Negative for chest pain, palpitations, claudication and leg swelling.  Gastrointestinal: Negative.  Negative for abdominal pain, blood in stool, diarrhea, nausea and vomiting.  Genitourinary: Negative.  Negative for dysuria.  Musculoskeletal: Negative.  Negative for back pain.  Skin: Negative.   Neurological: Negative for dizziness, tremors, speech change, focal weakness, seizures and headaches.  Endo/Heme/Allergies: Negative.  Does not bruise/bleed easily.  Psychiatric/Behavioral: Negative.  Negative for depression, hallucinations and suicidal ideas.     PHYSICAL EXAMINATION:  GENERAL:  76 y.o.-year-old patient lying in the bed with no acute distress.  NECK:  Supple, no jugular venous distention. No thyroid enlargement, no tenderness.  LUNGS: Normal breath sounds bilaterally, no wheezing, rales,rhonchi  No use of accessory muscles of respiration.  CARDIOVASCULAR: S1, S2 normal. No murmurs, rubs, or gallops.  ABDOMEN: Soft, non-tender, non-distended. Bowel sounds present. No organomegaly or mass.  EXTREMITIES: No pedal edema, cyanosis, or clubbing.  PSYCHIATRIC: The patient is alert and oriented x 3.  SKIN: No obvious rash, lesion, or ulcer.   DATA REVIEW:   CBC  Recent Labs Lab 01/03/17 0447  WBC 8.2  HGB 12.8  HCT 37.0  PLT 167    Chemistries   Recent Labs Lab 01/02/17 1423  01/05/17 0347  NA 139  < > 137  K 3.9  < > 4.0  CL 109  < > 107  CO2 19*  < > 24  GLUCOSE 355*  < > 215*  BUN  27*  < > 21*  CREATININE 1.15*  < > 0.86  CALCIUM 9.4  < > 9.1  AST 17  --   --   ALT 13*  --   --   ALKPHOS 78  --   --   BILITOT 0.6  --   --   < > = values in this interval not displayed.  Cardiac Enzymes No results for input(s): TROPONINI in the last 168 hours.  Microbiology Results  @MICRORSLT48 @  RADIOLOGY:  No results found.    Current Discharge Medication List    START taking these medications   Details  aspirin (ASPIRIN CHILDRENS) 81 MG chewable tablet Chew 1 tablet (81 mg total) by mouth daily. Qty: 120 tablet, Refills: 0    clopidogrel (PLAVIX) 75 MG tablet Take 1 tablet (75 mg total) by mouth daily. Qty: 30 tablet, Refills: 0      CONTINUE these medications which have CHANGED   Details  ibuprofen (ADVIL) 200 MG tablet Take 1  tablet (200 mg total) by mouth every 6 (six) hours as needed for moderate pain. Qty: 30 tablet, Refills: 0      CONTINUE these medications which have NOT CHANGED   Details  amLODipine (NORVASC) 5 MG tablet TAKE 1 TABLET BY MOUTH ONCE A DAY Qty: 30 tablet, Refills: 12    Calcium Carb-Cholecalciferol (CALCIUM 600+D) 600-800 MG-UNIT TABS Take 1 tablet by mouth daily.    glimepiride (AMARYL) 4 MG tablet TAKE 1 TABLET BY MOUTH ONCE A DAY Qty: 30 tablet, Refills: 12    INVOKAMET 150-500 MG TABS TAKE 1 TABLET BY MOUTH TWICE A DAY Qty: 60 tablet, Refills: 11    letrozole (FEMARA) 2.5 MG tablet Take 1 tablet (2.5 mg total) by mouth daily. Qty: 30 tablet, Refills: 12    lisinopril (PRINIVIL,ZESTRIL) 20 MG tablet TAKE 1 TABLET BY MOUTH ONCE A DAY Qty: 30 tablet, Refills: 12    simvastatin (ZOCOR) 10 MG tablet Take 1 tablet (10 mg total) by mouth at bedtime. Qty: 90 tablet, Refills: 3          Management plans discussed with the patient, family, Neurologist, Education officer, museum and she is in agreement. Stable for discharge home with home health, given 24/7 supervision  Patient should follow up with pcp  CODE STATUS: DO NOT  RESUSCITATE  TOTAL TIME TAKING CARE OF THIS PATIENT: 37 minutes.    Note: This dictation was prepared with Dragon dictation along with smaller phrase technology. Any transcriptional errors that result from this process are unintentional.  Max Sane M.D on 01/06/2017 at 11:12 AM  Between 7am to 6pm - Pager - [phone] After 6pm go to [email or web address] - password [facility]  Office  [phone]  CC: Primary care physician; Wilhemena Durie, MD

## 2017-01-04 LAB — URINE CULTURE

## 2017-01-04 LAB — HEMOGLOBIN A1C
Hgb A1c MFr Bld: 9.2 % — ABNORMAL HIGH (ref 4.8–5.6)
Mean Plasma Glucose: 217 mg/dL

## 2017-01-04 LAB — GLUCOSE, CAPILLARY
GLUCOSE-CAPILLARY: 176 mg/dL — AB (ref 65–99)
GLUCOSE-CAPILLARY: 169 mg/dL — AB (ref 65–99)
GLUCOSE-CAPILLARY: 146 mg/dL — AB (ref 65–99)
Glucose-Capillary: 197 mg/dL — ABNORMAL HIGH (ref 65–99)

## 2017-01-04 MED ORDER — CLOPIDOGREL BISULFATE 75 MG PO TABS
75.0000 mg | ORAL_TABLET | Freq: Every day | ORAL | Status: DC
  Administered 2017-01-04 – 2017-01-06 (×3): 75 mg via ORAL
  Filled 2017-01-04 (×3): qty 1

## 2017-01-04 MED ORDER — CEPHALEXIN 500 MG PO CAPS
500.0000 mg | ORAL_CAPSULE | Freq: Three times a day (TID) | ORAL | Status: DC
  Administered 2017-01-04 – 2017-01-06 (×7): 500 mg via ORAL
  Filled 2017-01-04 (×7): qty 1

## 2017-01-04 MED ORDER — ASPIRIN EC 81 MG PO TBEC
81.0000 mg | DELAYED_RELEASE_TABLET | Freq: Every day | ORAL | Status: DC
  Administered 2017-01-05 – 2017-01-06 (×2): 81 mg via ORAL
  Filled 2017-01-04 (×2): qty 1

## 2017-01-04 MED ORDER — NYSTATIN 100000 UNIT/GM EX POWD
Freq: Two times a day (BID) | CUTANEOUS | Status: DC
  Administered 2017-01-04 – 2017-01-06 (×5): via TOPICAL
  Filled 2017-01-04: qty 15
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
## 2017-01-04 NOTE — Progress Notes (Addendum)
Inpatient Diabetes Program Recommendations  AACE/ADA: New Consensus Statement on Inpatient Glycemic Control (2015)  Target Ranges:  Prepandial:   less than 140 mg/dL      Peak postprandial:   less than 180 mg/dL (1-2 hours)      Critically ill patients:  140 - 180 mg/dL   Lab Results  Component Value Date   GLUCAP 221 (H) 01/03/2017   HGBA1C 9.2 (H) 01/02/2017    Review of Glycemic Control  Results for Leone, MARCENA DIAS (MRN [id number]) as of 01/04/2017 07:39  Ref. Range 01/02/2017 23:16 01/03/2017 08:00 01/03/2017 11:50 01/03/2017 17:00 01/03/2017 21:17  Glucose-Capillary Latest Ref Range: 65 - 99 mg/dL 217 (H) 220 (H) 165 (H) 185 (H) 221 (H)    Diabetes history: Type 2 Outpatient Diabetes medications:Amaryl 4mg  q day, Invokamet 150-500mg  1 bid  Current orders for Inpatient glycemic control: Amaryl 4mg /day, Novolog 0-15 units tid  Inpatient Diabetes Program Recommendations:  Consider adding Novolog 0-5 units qhs.while inpatient.  She may do better with d/c Amaryl and start once a day Lantus with the Invokamet.  This would decrease risk of hypoglycemia and gives [place] the opportunity to improve her A1C.   At discharge, Consider Lantus 13 units qhs. Continue Invokamet at d/c.(d/c Amaryl)   Gentry Fitz, RN, [place], [place], CDE Diabetes Coordinator Inpatient Diabetes Program  [phone] (Team Pager) [phone] ([place]) 01/04/2017 7:43 AM

## 2017-01-04 NOTE — Progress Notes (Signed)
Subjective: Patient remains confused but able to follow commands.    Objective: Current vital signs: BP 132/79 (BP Location: Left Arm)   Pulse 89   Temp 98.2 F (36.8 C) (Oral)   Resp 18   Ht 5\' 4"  (1.626 m)   Wt 85.4 kg (188 lb 3.2 oz)   SpO2 98%   BMI 32.30 kg/m  Vital signs in last 24 hours: Temp:  [98 F (36.7 C)-98.6 F (37 C)] 98.2 F (36.8 C) (04/03 0805) Pulse Rate:  [78-94] 89 (04/03 0805) Resp:  [18-20] 18 (04/03 0805) BP: (132-154)/(64-85) 132/79 (04/03 0805) SpO2:  [97 %-99 %] 98 % (04/03 0805)  Intake/Output from previous day: 04/02 0701 - 04/03 0700 In: 600 [P.O.:600] Out: 100 [Urine:100] Intake/Output this shift: Total I/O In: 120 [P.O.:120] Out: -  Nutritional status: Diet heart healthy/carb modified Room service appropriate? Yes; Fluid consistency: Thin  Neurologic Exam: Mental Status: Alert, confused.  Speech fluent without evidence of aphasia.  Able to follow 3 step commands without difficulty. Cranial Nerves: II: Discs flat bilaterally; RHH, pupils equal, round, reactive to light and accommodation III,IV, VI: ptosis not present, extra-ocular motions intact bilaterally V,VII: smile symmetric, facial light touch sensation normal bilaterally VIII: hearing normal bilaterally IX,X: gag reflex present XI: bilateral shoulder shrug XII: midline tongue extension Motor: Right :  Upper extremity   5-/5                                     Left:     Upper extremity   5/5             Lower extremity   5/5                                                  Lower extremity   5/5 Tone and bulk:normal tone throughout; no atrophy noted   Lab Results: Basic Metabolic Panel:  Recent Labs Lab 01/02/17 1423 01/03/17 0447  NA 139 139  K 3.9 3.3*  CL 109 109  CO2 19* 22  GLUCOSE 355* 186*  BUN 27* 22*  CREATININE 1.15* 0.82  CALCIUM 9.4 8.9    Liver Function Tests:  Recent Labs Lab 01/02/17 1423  AST 17  ALT 13*  ALKPHOS 78  BILITOT 0.6  PROT 7.0   ALBUMIN 3.9   No results for input(s): LIPASE, AMYLASE in the last 168 hours. No results for input(s): AMMONIA in the last 168 hours.  CBC:  Recent Labs Lab 01/02/17 1423 01/03/17 0447  WBC 9.0 8.2  HGB 13.8 12.8  HCT 39.9 37.0  MCV 92.6 94.0  PLT 192 167    Cardiac Enzymes: No results for input(s): CKTOTAL, CKMB, CKMBINDEX, TROPONINI in the last 168 hours.  Lipid Panel:  Recent Labs Lab 01/02/17 1826  CHOL 163  TRIG 271*  HDL 49  CHOLHDL 3.3  VLDL 54*  LDLCALC 60    CBG:  Recent Labs Lab 01/03/17 1150 01/03/17 1700 01/03/17 2117 01/04/17 0743 01/04/17 1145  GLUCAP 165* 185* 221* 176* 169*    Microbiology: Results for orders placed or performed during the hospital encounter of 01/02/17  Urine culture     Status: Abnormal   Collection Time: 01/02/17  2:28 PM  Result Value Ref Range Status  Specimen Description URINE, RANDOM  Final   Special Requests NONE  Final   Culture MULTIPLE SPECIES PRESENT, SUGGEST RECOLLECTION (A)  Final   Report Status 01/04/2017 FINAL  Final    Coagulation Studies: No results for input(s): LABPROT, INR in the last 72 hours.  Imaging: Ct Angio Head W Or Wo Contrast  Result Date: 01/02/2017 CLINICAL DATA:  Altered mental status.  Left PCA infarct on CT. EXAM: CT ANGIOGRAPHY HEAD AND NECK TECHNIQUE: Multidetector CT imaging of the head and neck was performed using the standard protocol during bolus administration of intravenous contrast. Multiplanar CT image reconstructions and MIPs were obtained to evaluate the vascular anatomy. Carotid stenosis measurements (when applicable) are obtained utilizing NASCET criteria, using the distal internal carotid diameter as the denominator. CONTRAST:  60 mL Isovue 370 COMPARISON:  Noncontrast head CT earlier today. No prior angiographic imaging. FINDINGS: CTA NECK FINDINGS Aortic arch: Standard 3 vessel aortic arch with mild atherosclerotic plaque. Widely patent brachiocephalic and subclavian  arteries. Right carotid system: Patent without evidence of dissection or significant stenosis. Minimal soft plaque in the proximal ICA. Left carotid system: Patent without evidence of dissection or significant stenosis. Punctate focus of calcified plaque in the proximal ICA. Vertebral arteries: Patent without evidence of dissection or significant stenosis. The left vertebral artery is minimally larger than the right. Skeleton: Mild cervical spondylosis. Other neck: No mass or lymph node enlargement. Upper chest: Mild scarring of the lung apices. Review of the MIP images confirms the above findings CTA HEAD FINDINGS Anterior circulation: The internal carotid arteries are patent from skullbase to carotid termini with mild siphon atherosclerosis but no significant stenosis. There is a 1.5 mm left supraclinoid ICA aneurysm projecting inferomedially. There is also a 2 mm aneurysm projecting inferiorly from the right supraclinoid ICA at the same level. ACAs and MCAs are patent with mild branch vessel irregularity but no significant proximal stenosis. Posterior circulation: The intracranial vertebral arteries are widely patent to the basilar. Patent PICA and SCA origins are visualized bilaterally. The basilar artery is widely patent. There is a [place] right posterior communicating artery. The PCAs are patent without evidence of significant proximal stenosis on the right. There is a severe proximal left P2 stenosis, and there is left P3 occlusion associated with the infarct described on earlier CT. No aneurysm. Venous sinuses: Patent. Anatomic variants: None. Delayed phase: No abnormal enhancement. Review of the MIP images confirms the above findings IMPRESSION: 1. Severe proximal left P2 PCA stenosis.  Left P3 occlusion. 2. No significant anterior circulation stenosis. 3. No cervical carotid or vertebral artery stenosis. 4. 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 Electronically Signed   By: Logan Bores M.D.   On:  01/02/2017 19:13   Ct Head Wo Contrast  Result Date: 01/02/2017 CLINICAL DATA:  Initial evaluation for acute altered mental status. EXAM: CT HEAD WITHOUT CONTRAST TECHNIQUE: Contiguous axial images were obtained from the base of the skull through the vertex without intravenous contrast. COMPARISON:  None. FINDINGS: Brain: Diffuse prominence of the CSF containing spaces is compatible with generalized age-related cerebral atrophy. Patchy hypodensity within the periventricular and deep white matter both cerebral hemispheres most consistent with chronic microvascular ischemic disease, moderate in nature. Multifocal areas of encephalomalacia involving the bilateral frontal lobes likely related to remote ischemic infarcts. Additional remote right cerebellar infarct noted. There is geographic hypodensity involving the parasagittal left occipital lobe, compatible with acute/early subacute left PCA territory infarct (series 2, image 13). No associated mass effect or hemorrhage. No other  evidence for acute intracranial hemorrhage. No other large vessel territory infarct. No mass lesion, midline shift or mass effect. Diffuse ventricular prominence related to global parenchymal volume loss without hydrocephalus. No extra-axial fluid collection. Vascular: No asymmetric hyperdense vessel. Scattered vascular calcifications noted within the carotid siphons and vertebrobasilar system. Skull: Scalp soft tissues within normal limits.  Calvarium intact. Sinuses/Orbits: Globes and orbital soft tissues within normal limits. Paranasal sinuses and mastoid air cells are clear. Other: None. IMPRESSION: 1. Findings consistent with acute/early subacute left PCA territory infarct. No associated hemorrhage or mass effect. 2. Additional scattered remote bifrontal and right cerebellar ischemic infarcts. 3. Generalized age-related cerebral atrophy with moderate chronic microvascular ischemic disease. Electronically Signed   By: Jeannine Boga M.D.   On: 01/02/2017 18:09   Ct Angio Neck W And/or Wo Contrast  Result Date: 01/02/2017 CLINICAL DATA:  Altered mental status.  Left PCA infarct on CT. EXAM: CT ANGIOGRAPHY HEAD AND NECK TECHNIQUE: Multidetector CT imaging of the head and neck was performed using the standard protocol during bolus administration of intravenous contrast. Multiplanar CT image reconstructions and MIPs were obtained to evaluate the vascular anatomy. Carotid stenosis measurements (when applicable) are obtained utilizing NASCET criteria, using the distal internal carotid diameter as the denominator. CONTRAST:  60 mL Isovue 370 COMPARISON:  Noncontrast head CT earlier today. No prior angiographic imaging. FINDINGS: CTA NECK FINDINGS Aortic arch: Standard 3 vessel aortic arch with mild atherosclerotic plaque. Widely patent brachiocephalic and subclavian arteries. Right carotid system: Patent without evidence of dissection or significant stenosis. Minimal soft plaque in the proximal ICA. Left carotid system: Patent without evidence of dissection or significant stenosis. Punctate focus of calcified plaque in the proximal ICA. Vertebral arteries: Patent without evidence of dissection or significant stenosis. The left vertebral artery is minimally larger than the right. Skeleton: Mild cervical spondylosis. Other neck: No mass or lymph node enlargement. Upper chest: Mild scarring of the lung apices. Review of the MIP images confirms the above findings CTA HEAD FINDINGS Anterior circulation: The internal carotid arteries are patent from skullbase to carotid termini with mild siphon atherosclerosis but no significant stenosis. There is a 1.5 mm left supraclinoid ICA aneurysm projecting inferomedially. There is also a 2 mm aneurysm projecting inferiorly from the right supraclinoid ICA at the same level. ACAs and MCAs are patent with mild branch vessel irregularity but no significant proximal stenosis. Posterior circulation: The  intracranial vertebral arteries are widely patent to the basilar. Patent PICA and SCA origins are visualized bilaterally. The basilar artery is widely patent. There is a [place] right posterior communicating artery. The PCAs are patent without evidence of significant proximal stenosis on the right. There is a severe proximal left P2 stenosis, and there is left P3 occlusion associated with the infarct described on earlier CT. No aneurysm. Venous sinuses: Patent. Anatomic variants: None. Delayed phase: No abnormal enhancement. Review of the MIP images confirms the above findings IMPRESSION: 1. Severe proximal left P2 PCA stenosis.  Left P3 occlusion. 2. No significant anterior circulation stenosis. 3. No cervical carotid or vertebral artery stenosis. 4. 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 Electronically Signed   By: Logan Bores M.D.   On: 01/02/2017 19:13   Mr Brain Wo Contrast  Result Date: 01/03/2017 CLINICAL DATA:  PCA infarct. EXAM: MRI HEAD WITHOUT CONTRAST TECHNIQUE: Multiplanar, multiecho pulse sequences of the brain and surrounding structures were obtained without intravenous contrast. COMPARISON:  Head CT and CTA from yesterday FINDINGS: Brain: Moderate area of restricted diffusion in  the left occipital lobe. Mounger infarcts in the superior and posterior left thalamus and splenium of the corpus callosum. No acute infarct in a separate distribution. No signs of acute hemorrhage. Remote moderate infarcts in the bilateral inferior frontal/opercular region. Remote infarct in the peripheral inferior right cerebellum. No hemorrhage, hydrocephalus, or mass.  Age related volume loss. Vascular: Recent CTA.  Major flow voids are present. Skull and upper cervical spine: Negative Sinuses/Orbits: Negative IMPRESSION: 1. Acute left PCA territory infarct, moderate in the occipital lobe and mild in the thalamus. 2. Remote bilateral inferior frontal and right cerebellar infarcts. Electronically Signed   By:  Monte Fantasia M.D.   On: 01/03/2017 10:19   US Carotid Bilateral  Result Date: 01/03/2017 CLINICAL DATA:  76 year old female with a history of cerebrovascular accident. Cardiovascular risk factors include hypertension, hyperlipidemia, diabetes EXAM: BILATERAL CAROTID DUPLEX ULTRASOUND TECHNIQUE: Pearline Cables scale imaging, color Doppler and duplex ultrasound were performed of bilateral carotid and vertebral arteries in the neck. COMPARISON:  No prior duplex FINDINGS: Criteria: Quantification of carotid stenosis is based on velocity parameters that correlate the residual internal carotid diameter with NASCET-based stenosis levels, using the diameter of the distal internal carotid lumen as the denominator for stenosis measurement. The following velocity measurements were obtained: RIGHT ICA:  Systolic 55 cm/sec, Diastolic 12 cm/sec CCA:  75 cm/sec SYSTOLIC ICA/CCA RATIO:  0.7 ECA:  99 cm/sec LEFT ICA:  Systolic 50 cm/sec, Diastolic 14 cm/sec CCA:  62 cm/sec SYSTOLIC ICA/CCA RATIO:  0.8 ECA:  97 cm/sec Right Brachial SBP: Not acquired Left Brachial SBP: Not acquired RIGHT CAROTID ARTERY: No significant calcified disease of the right common carotid artery. Intermediate waveform maintained. Heterogeneous plaque without significant calcifications at the right carotid bifurcation. Low resistance waveform of the right ICA. Mild tortuosity. RIGHT VERTEBRAL ARTERY: Antegrade flow with low resistance waveform. LEFT CAROTID ARTERY: No significant calcified disease of the left common carotid artery. Intermediate waveform maintained. Heterogeneous plaque at the left carotid bifurcation without significant calcifications. Low resistance waveform of the left ICA. Mild tortuosity LEFT VERTEBRAL ARTERY:  Antegrade flow with low resistance waveform. IMPRESSION: Color duplex indicates minimal heterogeneous plaque, with no hemodynamically significant stenosis by duplex criteria in the extracranial cerebrovascular circulation. Signed, Dulcy Fanny. Earleen [place], DO [facility] Electronically Signed   By: Corrie Mckusick D.O.   On: 01/03/2017 11:00    Medications:  I have reviewed the patient's current medications. Scheduled: . aspirin  325 mg Oral Daily  . calcium-vitamin D  1 tablet Oral Q breakfast  . cephALEXin  500 mg Oral Q8H  . glimepiride  4 mg Oral Daily  . heparin  5,000 Units Subcutaneous Q8H  . insulin aspart  0-15 Units Subcutaneous TID WC  . letrozole  2.5 mg Oral Daily  . nystatin   Topical BID  . simvastatin  10 mg Oral QHS  . sodium chloride flush  3 mL Intravenous Q12H  . tuberculin  5 Units Intradermal Once    Assessment/Plan: Patient stable.  At times more confused and likely related to continued hospitalization.  Had family conference today concerning patient's prognosis and discharge disposition.   Echocardiogram shows no cardiac source of emboli with an EF of 60-65%.  A1c 9.2, LDL 60.   Recommendations: 1.  Blood sugar management 2.  ASA 81mg  and Plavix 75mg  started 3.  Continued PT with speech and OT evaluations to occur today.    LOS: 2 days   Alexis Goodell, MD Neurology [phone] 01/04/2017  12:52 PM

## 2017-01-04 NOTE — Progress Notes (Signed)
[facility] [place]    MR#:  [id number]  DATE OF BIRTH:  [birth year]  SUBJECTIVE:   Patient doing better this am Walked well with PT this am Family meeting this am  They are concerned about patient being discharged home and would like assisted living facility or skilled nursing facility. Patient with some confusion this morning however answers review of systems appropriately.   REVIEW OF SYSTEMS:    Review of Systems  Constitutional: Negative.  Negative for chills, fever and malaise/fatigue.  HENT: Negative.  Negative for ear discharge, ear pain, hearing loss, nosebleeds and sore throat.   Eyes: Negative.  Negative for blurred vision and pain.  Respiratory: Negative.  Negative for cough, hemoptysis, shortness of breath and wheezing.   Cardiovascular: Negative.  Negative for chest pain, palpitations and leg swelling.  Gastrointestinal: Negative.  Negative for abdominal pain, blood in stool, diarrhea, nausea and vomiting.  Genitourinary: Negative.  Negative for dysuria.  Musculoskeletal: Negative.  Negative for back pain.  Skin: Negative.   Neurological: Negative for dizziness, tremors, speech change, focal weakness, seizures and headaches.  Endo/Heme/Allergies: Negative.  Does not bruise/bleed easily.  Psychiatric/Behavioral: Positive for memory loss. Negative for depression, hallucinations and suicidal ideas.    Tolerating Diet: yes      DRUG ALLERGIES:   Allergies  Allergen Reactions  . Penicillins     VITALS:  Blood pressure 132/79, pulse 89, temperature 98.2 F (36.8 C), temperature source Oral, resp. rate 18, height 5\' 4"  (1.626 m), weight 85.4 kg (188 lb 3.2 oz), SpO2 98 %.  PHYSICAL EXAMINATION:   Physical Exam  Constitutional: She is well-developed, well-nourished, and in no distress. No distress.  HENT:  Head: Normocephalic.  Eyes: No scleral icterus.  Neck: Normal range of motion. Neck supple.  No JVD present. No tracheal deviation present.  Cardiovascular: Normal rate, regular rhythm and normal heart sounds.  Exam reveals no gallop and no friction rub.   No murmur heard. Pulmonary/Chest: Effort normal and breath sounds normal. No respiratory distress. She has no wheezes. She has no rales. She exhibits no tenderness.  Abdominal: Soft. Bowel sounds are normal. She exhibits no distension and no mass. There is no tenderness. There is no rebound and no guarding.  Musculoskeletal: Normal range of motion. She exhibits no edema.  Neurological: She is alert.  To name and place and month not year  Skin: Skin is warm. No rash noted. No erythema.  Psychiatric: Affect and judgment normal.      LABORATORY PANEL:   CBC  Recent Labs Lab 01/03/17 0447  WBC 8.2  HGB 12.8  HCT 37.0  PLT 167   ------------------------------------------------------------------------------------------------------------------  Chemistries   Recent Labs Lab 01/02/17 1423 01/03/17 0447  NA 139 139  K 3.9 3.3*  CL 109 109  CO2 19* 22  GLUCOSE 355* 186*  BUN 27* 22*  CREATININE 1.15* 0.82  CALCIUM 9.4 8.9  AST 17  --   ALT 13*  --   ALKPHOS 78  --   BILITOT 0.6  --    ------------------------------------------------------------------------------------------------------------------  Cardiac Enzymes No results for input(s): TROPONINI in the last 168 hours. ------------------------------------------------------------------------------------------------------------------  RADIOLOGY:  Ct Angio Head W Or Wo Contrast  Result Date: 01/02/2017 CLINICAL DATA:  Altered mental status.  Left PCA infarct on CT. EXAM: CT ANGIOGRAPHY HEAD AND NECK TECHNIQUE: Multidetector CT imaging of the head and neck was performed using the standard protocol during bolus administration of intravenous  contrast. Multiplanar CT image reconstructions and MIPs were obtained to evaluate the vascular anatomy. Carotid stenosis  measurements (when applicable) are obtained utilizing NASCET criteria, using the distal internal carotid diameter as the denominator. CONTRAST:  60 mL Isovue 370 COMPARISON:  Noncontrast head CT earlier today. No prior angiographic imaging. FINDINGS: CTA NECK FINDINGS Aortic arch: Standard 3 vessel aortic arch with mild atherosclerotic plaque. Widely patent brachiocephalic and subclavian arteries. Right carotid system: Patent without evidence of dissection or significant stenosis. Minimal soft plaque in the proximal ICA. Left carotid system: Patent without evidence of dissection or significant stenosis. Punctate focus of calcified plaque in the proximal ICA. Vertebral arteries: Patent without evidence of dissection or significant stenosis. The left vertebral artery is minimally larger than the right. Skeleton: Mild cervical spondylosis. Other neck: No mass or lymph node enlargement. Upper chest: Mild scarring of the lung apices. Review of the MIP images confirms the above findings CTA HEAD FINDINGS Anterior circulation: The internal carotid arteries are patent from skullbase to carotid termini with mild siphon atherosclerosis but no significant stenosis. There is a 1.5 mm left supraclinoid ICA aneurysm projecting inferomedially. There is also a 2 mm aneurysm projecting inferiorly from the right supraclinoid ICA at the same level. ACAs and MCAs are patent with mild branch vessel irregularity but no significant proximal stenosis. Posterior circulation: The intracranial vertebral arteries are widely patent to the basilar. Patent PICA and SCA origins are visualized bilaterally. The basilar artery is widely patent. There is a [place] right posterior communicating artery. The PCAs are patent without evidence of significant proximal stenosis on the right. There is a severe proximal left P2 stenosis, and there is left P3 occlusion associated with the infarct described on earlier CT. No aneurysm. Venous sinuses: Patent.  Anatomic variants: None. Delayed phase: No abnormal enhancement. Review of the MIP images confirms the above findings IMPRESSION: 1. Severe proximal left P2 PCA stenosis.  Left P3 occlusion. 2. No significant anterior circulation stenosis. 3. No cervical carotid or vertebral artery stenosis. 4. 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 Electronically Signed   By: Logan Bores M.D.   On: 01/02/2017 19:13   Ct Head Wo Contrast  Result Date: 01/02/2017 CLINICAL DATA:  Initial evaluation for acute altered mental status. EXAM: CT HEAD WITHOUT CONTRAST TECHNIQUE: Contiguous axial images were obtained from the base of the skull through the vertex without intravenous contrast. COMPARISON:  None. FINDINGS: Brain: Diffuse prominence of the CSF containing spaces is compatible with generalized age-related cerebral atrophy. Patchy hypodensity within the periventricular and deep white matter both cerebral hemispheres most consistent with chronic microvascular ischemic disease, moderate in nature. Multifocal areas of encephalomalacia involving the bilateral frontal lobes likely related to remote ischemic infarcts. Additional remote right cerebellar infarct noted. There is geographic hypodensity involving the parasagittal left occipital lobe, compatible with acute/early subacute left PCA territory infarct (series 2, image 13). No associated mass effect or hemorrhage. No other evidence for acute intracranial hemorrhage. No other large vessel territory infarct. No mass lesion, midline shift or mass effect. Diffuse ventricular prominence related to global parenchymal volume loss without hydrocephalus. No extra-axial fluid collection. Vascular: No asymmetric hyperdense vessel. Scattered vascular calcifications noted within the carotid siphons and vertebrobasilar system. Skull: Scalp soft tissues within normal limits.  Calvarium intact. Sinuses/Orbits: Globes and orbital soft tissues within normal limits. Paranasal sinuses and  mastoid air cells are clear. Other: None. IMPRESSION: 1. Findings consistent with acute/early subacute left PCA territory infarct. No associated hemorrhage or mass effect. 2. Additional  scattered remote bifrontal and right cerebellar ischemic infarcts. 3. Generalized age-related cerebral atrophy with moderate chronic microvascular ischemic disease. Electronically Signed   By: Jeannine Boga M.D.   On: 01/02/2017 18:09   Ct Angio Neck W And/or Wo Contrast  Result Date: 01/02/2017 CLINICAL DATA:  Altered mental status.  Left PCA infarct on CT. EXAM: CT ANGIOGRAPHY HEAD AND NECK TECHNIQUE: Multidetector CT imaging of the head and neck was performed using the standard protocol during bolus administration of intravenous contrast. Multiplanar CT image reconstructions and MIPs were obtained to evaluate the vascular anatomy. Carotid stenosis measurements (when applicable) are obtained utilizing NASCET criteria, using the distal internal carotid diameter as the denominator. CONTRAST:  60 mL Isovue 370 COMPARISON:  Noncontrast head CT earlier today. No prior angiographic imaging. FINDINGS: CTA NECK FINDINGS Aortic arch: Standard 3 vessel aortic arch with mild atherosclerotic plaque. Widely patent brachiocephalic and subclavian arteries. Right carotid system: Patent without evidence of dissection or significant stenosis. Minimal soft plaque in the proximal ICA. Left carotid system: Patent without evidence of dissection or significant stenosis. Punctate focus of calcified plaque in the proximal ICA. Vertebral arteries: Patent without evidence of dissection or significant stenosis. The left vertebral artery is minimally larger than the right. Skeleton: Mild cervical spondylosis. Other neck: No mass or lymph node enlargement. Upper chest: Mild scarring of the lung apices. Review of the MIP images confirms the above findings CTA HEAD FINDINGS Anterior circulation: The internal carotid arteries are patent from skullbase to  carotid termini with mild siphon atherosclerosis but no significant stenosis. There is a 1.5 mm left supraclinoid ICA aneurysm projecting inferomedially. There is also a 2 mm aneurysm projecting inferiorly from the right supraclinoid ICA at the same level. ACAs and MCAs are patent with mild branch vessel irregularity but no significant proximal stenosis. Posterior circulation: The intracranial vertebral arteries are widely patent to the basilar. Patent PICA and SCA origins are visualized bilaterally. The basilar artery is widely patent. There is a [place] right posterior communicating artery. The PCAs are patent without evidence of significant proximal stenosis on the right. There is a severe proximal left P2 stenosis, and there is left P3 occlusion associated with the infarct described on earlier CT. No aneurysm. Venous sinuses: Patent. Anatomic variants: None. Delayed phase: No abnormal enhancement. Review of the MIP images confirms the above findings IMPRESSION: 1. Severe proximal left P2 PCA stenosis.  Left P3 occlusion. 2. No significant anterior circulation stenosis. 3. No cervical carotid or vertebral artery stenosis. 4. 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 Electronically Signed   By: Logan Bores M.D.   On: 01/02/2017 19:13   Mr Brain Wo Contrast  Result Date: 01/03/2017 CLINICAL DATA:  PCA infarct. EXAM: MRI HEAD WITHOUT CONTRAST TECHNIQUE: Multiplanar, multiecho pulse sequences of the brain and surrounding structures were obtained without intravenous contrast. COMPARISON:  Head CT and CTA from yesterday FINDINGS: Brain: Moderate area of restricted diffusion in the left occipital lobe. Kleinert infarcts in the superior and posterior left thalamus and splenium of the corpus callosum. No acute infarct in a separate distribution. No signs of acute hemorrhage. Remote moderate infarcts in the bilateral inferior frontal/opercular region. Remote infarct in the peripheral inferior right cerebellum. No  hemorrhage, hydrocephalus, or mass.  Age related volume loss. Vascular: Recent CTA.  Major flow voids are present. Skull and upper cervical spine: Negative Sinuses/Orbits: Negative IMPRESSION: 1. Acute left PCA territory infarct, moderate in the occipital lobe and mild in the thalamus. 2. Remote bilateral inferior frontal and  right cerebellar infarcts. Electronically Signed   By: Monte Fantasia M.D.   On: 01/03/2017 10:19   US Carotid Bilateral  Result Date: 01/03/2017 CLINICAL DATA:  76 year old female with a history of cerebrovascular accident. Cardiovascular risk factors include hypertension, hyperlipidemia, diabetes EXAM: BILATERAL CAROTID DUPLEX ULTRASOUND TECHNIQUE: Pearline Cables scale imaging, color Doppler and duplex ultrasound were performed of bilateral carotid and vertebral arteries in the neck. COMPARISON:  No prior duplex FINDINGS: Criteria: Quantification of carotid stenosis is based on velocity parameters that correlate the residual internal carotid diameter with NASCET-based stenosis levels, using the diameter of the distal internal carotid lumen as the denominator for stenosis measurement. The following velocity measurements were obtained: RIGHT ICA:  Systolic 55 cm/sec, Diastolic 12 cm/sec CCA:  75 cm/sec SYSTOLIC ICA/CCA RATIO:  0.7 ECA:  99 cm/sec LEFT ICA:  Systolic 50 cm/sec, Diastolic 14 cm/sec CCA:  62 cm/sec SYSTOLIC ICA/CCA RATIO:  0.8 ECA:  97 cm/sec Right Brachial SBP: Not acquired Left Brachial SBP: Not acquired RIGHT CAROTID ARTERY: No significant calcified disease of the right common carotid artery. Intermediate waveform maintained. Heterogeneous plaque without significant calcifications at the right carotid bifurcation. Low resistance waveform of the right ICA. Mild tortuosity. RIGHT VERTEBRAL ARTERY: Antegrade flow with low resistance waveform. LEFT CAROTID ARTERY: No significant calcified disease of the left common carotid artery. Intermediate waveform maintained. Heterogeneous plaque at  the left carotid bifurcation without significant calcifications. Low resistance waveform of the left ICA. Mild tortuosity LEFT VERTEBRAL ARTERY:  Antegrade flow with low resistance waveform. IMPRESSION: Color duplex indicates minimal heterogeneous plaque, with no hemodynamically significant stenosis by duplex criteria in the extracranial cerebrovascular circulation. Signed, Dulcy Fanny. Earleen Newport, DO [facility] Electronically Signed   By: Corrie Mckusick D.O.   On: 01/03/2017 11:00     ASSESSMENT AND PLAN:   76 year old female with a history of diabetes and essential hypertension who presented with altered mental status and found to have hyperglycemia and acute/early subacute left PCA territory infarct.  1. acute left PCA territory infarct:  Patient underwent stroke workup with MRI, carotid ultrasound and echocardiogram. MRI was consistent with left PCA territory infarct. Carotid Dopplers showed no hemodynamically significant stenosis ECHO shows no source of thrombus  CTA head/neck shows  1. 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 2. No significant anterior circulation stenosis. 3. No cervical carotid or vertebral artery stenosis. 4. 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   Patient will be on aspirin, Plavix and continue statin  His therapy is recommending home health at discharge with supervision for the first few days, however family gravely concerned about d/c plan Dr Doy Mince to look into INPT rehab vs ALF   LDL 60   2. Uncontrolled diabetes: Hemoglobin A1c 9.2 Continue sliding scale Continue Amaryl  3. Essential hypertension: Due to problem #1 hypertensive medications are on hold. Resume in a.m.  4. Urinary tract infection: Changed to Keflex 5. Hypokalemia: Repleted  6. History of breast cancer on Femara   Management plans discussed with the patient and family and she is in  agreement.  CODE STATUS: full  TOTAL TIME TAKING CARE OF THIS PATIENT: 45 minutes. (Including family meeting this morning)    POSSIBLE D/C tomorrow, DEPENDING ON CLINICAL CONDITION.  Follow-up on PPD results Ozie Dimaria M.D on 01/04/2017 at 12:06 PM  Between 7am to 6pm - Pager - [phone] After 6pm go to [email or web address] - password [email or web address] [facility]  Office  [phone]  CC:  Primary care physician; Wilhemena Durie, MD  Note: This dictation was prepared with Dragon dictation along with smaller phrase technology. Any transcriptional errors that result from this process are unintentional.

## 2017-01-04 NOTE — Progress Notes (Signed)
qPhysical Therapy Treatment Patient Details Name: Lori Berger MRN: [id number] DOB: June 15, 1941 Today's Date: 01/04/2017    History of Present Illness Pt is a 76 yo female presented to [facility] after experiencing some confusion and imbalance while at church. 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 PMH includes; breast CA, endometrial CA, HLD, and HTN.     PT Comments    Out of bed with increased time but no physical assist.  She was able to stand and ambulate 200' then an additional 140' with walker and min guard.  Gait generally steady without LOB but fatigue was noted.    Pt able to carry on appropriate conversations throughout session but did repeat information on several occasions.     Follow Up Recommendations  Home health PT;Supervision - Intermittent     Equipment Recommendations  Rolling walker with 5" wheels    Recommendations for Other Services       Precautions / Restrictions Precautions Precautions: Fall Restrictions Weight Bearing Restrictions: No    Mobility  Bed Mobility Overal bed mobility: Modified Independent             General bed mobility comments: able to safely transfe in and OOB w/ increased time  Transfers Overall transfer level: Modified independent Equipment used: Rolling walker (2 wheeled)             General transfer comment: use of bilat UEs for push off into standing, demonstrates good safe technique w/o need for verbal cues  Ambulation/Gait   Ambulation Distance (Feet): 200 Feet Assistive device: Rolling walker (2 wheeled);None Gait Pattern/deviations: Step-through pattern;Decreased stride length Gait velocity: decreased but safe for household mobility Gait velocity interpretation: Below normal speed for age/gender General Gait Details:  200' then an additional 140' after short seated rest.   Stairs            Wheelchair Mobility    Modified Rankin (Stroke  Patients Only)       Balance Overall balance assessment: Needs assistance Sitting-balance support: No upper extremity supported;Feet supported Sitting balance-Leahy Scale: Good Sitting balance - Comments: able to maintain upright seated posture w/o back support   Standing balance support: Bilateral upper extremity supported;During functional activity Standing balance-Leahy Scale: Fair                              Cognition Arousal/Alertness: Awake/alert Behavior During Therapy: WFL for tasks assessed/performed Overall Cognitive Status: Within Functional Limits for tasks assessed                                 General Comments: Pt did repeat information several times during session.      Exercises      General Comments        Pertinent Vitals/Pain Pain Assessment: No/denies pain    Home Living                      Prior Function            PT Goals (current goals can now be found in the care plan section) Progress towards PT goals: Progressing toward goals    Frequency    7X/week      PT Plan Current plan remains appropriate    Co-evaluation  End of Session Equipment Utilized During Treatment: Gait belt Activity Tolerance: Patient tolerated treatment well Patient left: in chair;with call bell/phone within reach;with chair alarm set         Time: 6815-9470 PT Time Calculation (min) (ACUTE ONLY): 23 min  Charges:  $Gait Training: 23-37 mins                    G Codes:       Chesley Noon, PTA 01/04/17, 9:08 AM

## 2017-01-04 NOTE — Telephone Encounter (Signed)
Patient still at the hospital as of today 01/04/17-aa

## 2017-01-04 NOTE — Progress Notes (Signed)
qPhysical Therapy Treatment Patient Details Name: Lori Berger MRN: [id number] DOB: 04-Mar-1941 Today's Date: 01/04/2017    History of Present Illness Pt is a 76 yo female presented to [facility] after experiencing some confusion and imbalance while at church. 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 PMH includes; breast CA, endometrial CA, HLD, and HTN.     PT Comments    Pt willing to participate in PT session again this morning, and was eager to return home. Tested patients visual fields and patient able to identify all objects and lines on a piece of paper as well as draw out a clock with the correct time. She does display some cognitive and safety concerns as she was unable to recall her PT evaluation from yesterday and had difficulty remembering the layout of her home. Pt able to ambulate but requires a RW due to decreased balance. She required min assist to safely ambulate stairs and patient states she has been unable to access her upstairs bath/shower and bedroom upstairs due to difficulty with ambulating steps. Patient also lives alone and currently requires supervision so safely complete mobility tasks.PT now recommends the patient transition to STR due to patient's decreased balance, functional mobility and minor cognitive impairments; she also lacks caregiver support at home and is unable to access her bedroom and bathroom on the second floor.    Follow Up Recommendations  SNF     Equipment Recommendations  Rolling walker with 5" wheels    Recommendations for Other Services       Precautions / Restrictions Precautions Precautions: Fall Restrictions Weight Bearing Restrictions: No    Mobility  Bed Mobility Overal bed mobility: Modified Independent             General bed mobility comments: able to safely transfe in and OOB w/ increased time  Transfers Overall transfer level: Modified independent Equipment used:  Rolling walker (2 wheeled)             General transfer comment: use of bilat UEs for push off into standing, demonstrates good safe technique w/o need for verbal cues  Ambulation/Gait Ambulation/Gait assistance: Supervision Ambulation Distance (Feet): 200 Feet Assistive device: Rolling walker (2 wheeled) Gait Pattern/deviations: Step-through pattern;Decreased stride length   Gait velocity interpretation: Below normal speed for age/gender General Gait Details: still requires use of RW for improved stability when ambulating, takes symmetrical steps but has decreased cadence   Stairs Stairs: Yes   Stair Management: One rail Right;Step to pattern Number of Stairs: 6 General stair comments: able to ascend steps, noted minimal buckling of the L knee when ascending but able to correct w/ min assist, displays decreased stability and some apprehension when descending stairs  and required min assist throughout  Wheelchair Mobility    Modified Rankin (Stroke Patients Only)       Balance Overall balance assessment: Needs assistance Sitting-balance support: No upper extremity supported;Feet supported Sitting balance-Leahy Scale: Good Sitting balance - Comments: able to maintain upright seated posture w/o back support   Standing balance support: Bilateral upper extremity supported;During functional activity Standing balance-Leahy Scale: Fair Standing balance comment: displays improved standing stability w/ RW during ambulation, reaches for objects                             Cognition Arousal/Alertness: Awake/alert Behavior During Therapy: [facility] for tasks assessed/performed   Area of Impairment: Memory  Memory: Decreased short-term memory         General Comments: pt has difficulty recalling events from yesterday, did not remember walking around nursing station, requires increased time to recall information about her home that she has  lived in for years      Exercises      General Comments        Pertinent Vitals/Pain Pain Assessment: No/denies pain    Home Living                      Prior Function            PT Goals (current goals can now be found in the care plan section)      Frequency    7X/week      PT Plan Discharge plan needs to be updated (pt lives alone and is unable to access bedroom/bathroom)    Co-evaluation             End of Session Equipment Utilized During Treatment: Gait belt Activity Tolerance: Patient tolerated treatment well Patient left: in bed;with bed alarm set;with family/visitor present;with call bell/phone within reach Nurse Communication: Mobility status PT Visit Diagnosis: Unsteadiness on feet (R26.81);Difficulty in walking, not elsewhere classified (R26.2)     Time: 4742-5956 PT Time Calculation (min) (ACUTE ONLY): 31 min  Charges:                       G Codes:       [employer] Student PT 01/04/17, 4:50 PM [phone]    Lori Berger 01/04/2017, 1:14 PM

## 2017-01-05 DIAGNOSIS — I63532 Cerebral infarction due to unspecified occlusion or stenosis of left posterior cerebral artery: Principal | ICD-10-CM

## 2017-01-05 LAB — BASIC METABOLIC PANEL
Anion gap: 6 (ref 5–15)
BUN: 21 mg/dL — AB (ref 6–20)
CALCIUM: 9.1 mg/dL (ref 8.9–10.3)
CHLORIDE: 107 mmol/L (ref 101–111)
CO2: 24 mmol/L (ref 22–32)
CREATININE: 0.86 mg/dL (ref 0.44–1.00)
GFR calc Af Amer: >60 mL/min (ref >60)
GFR calc non Af Amer: >60 mL/min (ref >60)
Glucose, Bld: 215 mg/dL — ABNORMAL HIGH (ref 65–99)
Potassium: 4 mmol/L (ref 3.5–5.1)
Sodium: 137 mmol/L (ref 135–145)

## 2017-01-05 LAB — GLUCOSE, CAPILLARY
GLUCOSE-CAPILLARY: 204 mg/dL — AB (ref 65–99)
GLUCOSE-CAPILLARY: 160 mg/dL — AB (ref 65–99)
Glucose-Capillary: 210 mg/dL — ABNORMAL HIGH (ref 65–99)
Glucose-Capillary: 210 mg/dL — ABNORMAL HIGH (ref 65–99)

## 2017-01-05 MED ORDER — ASPIRIN 81 MG PO CHEW: 81.0000 mg | CHEWABLE_TABLET | Freq: Every day | ORAL | 0 refills | Status: DC

## 2017-01-05 MED ORDER — SODIUM CHLORIDE 0.9 % IV SOLN: 25.0000 ug/h | INTRAVENOUS | Status: DC

## 2017-01-05 NOTE — Evaluation (Signed)
Speech Language Pathology Evaluation Patient Details Name: Lori Berger MRN: [id number] DOB: September 23, 1941 Today's Date: 01/05/2017 Time: 2229-7989 SLP Time Calculation (min) (ACUTE ONLY): 60 min  Problem List:  Patient Active Problem List   Diagnosis Date Noted  . Stroke ([place]) 01/02/2017  . Acute lower UTI 01/02/2017  . Stroke (cerebrum) ([place]) 01/02/2017  . Personal history of malignant neoplasm of breast 06/29/2016  . Malignant neoplasm of right female breast ([place]) 03/30/2016  . Loss of weight 03/30/2016  . Faintness 01/26/2016  . Osteopenia 01/26/2016  . Breast cancer, female, right 09/26/2015  . Basal cell carcinoma of face 02/07/2015  . Diabetes mellitus, type 2 ([place]) 02/07/2015  . Dysfunction of eustachian tube 02/07/2015  . 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 Malignant neoplasm of corpus uteri, except isthmus ([place]) 02/07/2015  . Polyp of corpus uteri 02/07/2015  . Essential (primary) hypertension 02/07/2015  . Need for prophylactic hormone replacement therapy (postmenopausal) 02/07/2015  . Decreased potassium in the blood 02/07/2015  . 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 Malaise and fatigue 02/07/2015  . Combined fat and carbohydrate induced hyperlipemia 02/07/2015  . Adiposity 02/07/2015  . Arthritis, degenerative 02/07/2015  . Allergic rhinitis 02/07/2015  . Postmenopausal bleeding 02/07/2015  . Psychosis 02/07/2015  . Inflammation of sacroiliac joint ([place]) 02/07/2015   Past Medical History:  Past Medical History:  Diagnosis Date  . Arthritis   . BCC (basal cell carcinoma)   . Breast cancer ([place]) 1990   left 1990  . Breast cancer ([place]) 11/11/2015   Right, T2 (2.4 cm(, N0; positive deep margin, ER: 100%, PR: 60%; Her 2 neu not amplified, Mammoprint: low risk  . Diabetes mellitus without complication ([place])   . Endometrial cancer ([place])   . Hyperlipidemia   . Hypertension   . Major depressive disorder   . PONV (postoperative nausea and  vomiting)    Past Surgical History:  Past Surgical History:  Procedure Laterality Date  . ABDOMINAL HYSTERECTOMY    . MASTECTOMY Left    1990  . SENTINEL NODE BIOPSY Right 11/11/2015   Procedure: SENTINEL NODE BIOPSY;  Surgeon: Robert Bellow, MD;  Location: [facility] ORS;  Service: General;  Laterality: Right;  . SIMPLE MASTECTOMY WITH AXILLARY SENTINEL NODE BIOPSY Right 11/11/2015   Procedure: SIMPLE MASTECTOMY;  Surgeon: Robert Bellow, MD;  Location: [facility] ORS;  Service: General;  Laterality: Right;   HPI:  Pt is a 76 y.o. female with a known history of Arthritis, major depressive dis., breast cancer, diabetes, endometrial cancer, hyperlipidemia, hypertension- was at church today for [employer], and was having confusion and also some imbalance in walking. Concerned with this, she was brought to emergency room by her son. After checking her blood sugar level, it was more than 400. On further workup in ER, she was noted to have UTI and on CT scan of the head she was noted to have an acute stroke. Tele-neurology consult was done and he suggested not to give anti-coagulation as patient's symptoms started early in the morning and she didn't have much focal symptoms. Upon discussion w/ family(sons), it was discovered that pt has been having memory issues and difficulty managing taking her medication appropriately for ~6+ months now w/ episodes of "passing out when she doesn't take them correctly" per sons. Unsure of pt's baseline Cognitive status prior to this admission as she lives alone at home.     Assessment / Plan / Recommendation Clinical Impression  Pt was administered the [facility] Cognitive Assessment(MOCA)  screening at bedside in her room today. She appears to present w/ mild+ Cognitive decline most noted in tasks of Executive Functioning and Memory. As the tasks during Executive Functioning became somewhat more complex requiring concentration and attention, pt's attention appeared to  wax and wane as she became "fatigued" w/ the task then easily distracted. A similar presentation was noted during Memory tasks in the area of Delayed Recall of information. When given verbal cues and prompts toward the end of the screening, pt was able to talk out the task and increase her accuracy w/ the task. This may be a successful strategy for pt and caregivers during ADLs. Pt's presentation on this screening tool today may be indicative of the Cognitive processes pt was experiencing prior to this hospitalization(sons' descriptions of deficits). Pt would benefit from ongoing f/u w/ Neurology for further assessment; Cognitive therapy post discharge from the hospital to learn strategies to enhance communication and safety awareness during ADLs. Recommend 24 hour supervision post discharge from hospital as part of her poc. Results were discussed w/ MD, NSG, pt, and family members present.     SLP Assessment  SLP Recommendation/Assessment: All further Speech Lanaguage Pathology  needs can be addressed in the next venue of care SLP Visit Diagnosis: Attention and concentration deficit Attention and concentration deficit following: Cerebral infarction    Follow Up Recommendations  Home health SLP;24 hour supervision/assistance (SNF TBD)    Frequency and Duration  (TBD)   (TBD)      SLP Evaluation Cognition  Overall Cognitive Status: History of cognitive impairments - at baseline (now impaired/different moreso from baseline) Arousal/Alertness: Awake/alert Orientation Level: Oriented to person;Oriented to place;Oriented to time;Disoriented to situation Attention: Focused Focused Attention: Appears intact Memory: Impaired Memory Impairment: Decreased recall of new information (delayed recall) Awareness: Impaired Awareness Impairment: Anticipatory impairment Problem Solving: Impaired Problem Solving Impairment: Verbal complex;Functional complex Executive Function: Reasoning;Sequencing  (complex) Reasoning: Impaired Reasoning Impairment: Verbal complex;Functional complex Sequencing: Impaired Sequencing Impairment: Functional complex Behaviors: Perseveration;Poor frustration tolerance (mild) Safety/Judgment: Impaired Comments: "I'll just not use the stove"       Comprehension  Auditory Comprehension Overall Auditory Comprehension: Appears within functional limits for tasks assessed Yes/No Questions: Within Functional Limits Commands: Within Functional Limits Conversation: Simple Other Conversation Comments: some perseveration noted; repetition of statements Interfering Components: Attention EffectiveTechniques: Repetition;Extra processing time Reading Comprehension Reading Status: Within funtional limits    Expression Expression Primary Mode of Expression: Verbal Verbal Expression Overall Verbal Expression: Appears within functional limits for tasks assessed Initiation: No impairment Automatic Speech: Name;Social Response;Counting;Day of week;Month of year Level of Generative/Spontaneous Verbalization: Conversation (grossly) Repetition: No impairment Pragmatics: No impairment (grossly wfl) Non-Verbal Means of Communication: Not applicable Written Expression Dominant Hand: Right Written Expression: Not tested   Oral / Motor  Oral Motor/Sensory Function Overall Oral Motor/Sensory Function: Within functional limits Motor Speech Overall Motor Speech: Appears within functional limits for tasks assessed Respiration: Within functional limits Phonation: Normal Resonance: Within functional limits Articulation: Within functional limitis Intelligibility: Intelligible Motor Planning: Witnin functional limits Motor Speech Errors: Not applicable Interfering Components:  (n/a)   GO                     Orinda Kenner, MS, CCC-SLP Watson,Katherine 01/05/2017, 4:28 PM

## 2017-01-05 NOTE — Care Management Important Message (Signed)
Important Message  Patient Details  Name: Lori Berger MRN: [id number] Date of Birth: 1941/02/03   Medicare Important Message Given:  Yes    Shelbie Ammons, RN 01/05/2017, 8:10 AM

## 2017-01-05 NOTE — Progress Notes (Signed)
PT Cancellation Note  Patient Details Name: MACENZIE BURFORD MRN: [id number] DOB: 03-30-1941   Cancelled Treatment:    Reason Eval/Treat Not Completed: Other (comment). Treatment attempted; pt in a MD meeting with family. Re attempt at a later time/date as the schedule allows.    Larae Grooms, PTA 01/05/2017, 11:24 AM

## 2017-01-05 NOTE — Progress Notes (Signed)
Subjective: Patient ha cooperated well with therapy. No new neurological complaints.    Objective: Current vital signs: BP 140/74 (BP Location: Left Arm)   Pulse 71   Temp 97.9 F (36.6 C) (Oral)   Resp 19   Ht 5\' 4"  (1.626 m)   Wt 85.4 kg (188 lb 3.2 oz)   SpO2 98%   BMI 32.30 kg/m  Vital signs in last 24 hours: Temp:  [97.9 F (36.6 C)-99 F (37.2 C)] 97.9 F (36.6 C) (04/04 0357) Pulse Rate:  [71-84] 71 (04/04 0357) Resp:  [18-19] 19 (04/04 0357) BP: (135-153)/(66-74) 140/74 (04/04 0357) SpO2:  [98 %-100 %] 98 % (04/04 0357)  Intake/Output from previous day: 04/03 0701 - 04/04 0700 In: 480 [P.O.:480] Out: 650 [Urine:650] Intake/Output this shift: No intake/output data recorded. Nutritional status: Diet heart healthy/carb modified Room service appropriate? Yes; Fluid consistency: Thin Diet - low sodium heart healthy  Neurologic Exam: Mental Status: Alert, oriented.  Speech fluent without evidence of aphasia.  Able to follow 3 step commands without difficulty. Cranial Nerves: II: Discs flat bilaterally; RHH, pupils equal, round, reactive to light and accommodation III,IV, VI: ptosis not present, extra-ocular motions intact bilaterally V,VII: smile symmetric, facial light touch sensation normal bilaterally VIII: hearing normal bilaterally IX,X: gag reflex present XI: bilateral shoulder shrug XII: midline tongue extension Motor: Right :Upper extremity 5-/5Left: Upper extremity 5/5 Lower extremity 5/5Lower extremity 5/5 Tone and bulk:normal tone throughout; no atrophy noted    Lab Results: Basic Metabolic Panel:  Recent Labs Lab 01/02/17 1423 01/03/17 0447 01/05/17 0347  NA 139 139 137  K 3.9 3.3* 4.0  CL 109 109 107  CO2 19* 22 24  GLUCOSE 355* 186* 215*  BUN 27* 22* 21*  CREATININE 1.15* 0.82 0.86  CALCIUM 9.4 8.9 9.1    Liver Function  Tests:  Recent Labs Lab 01/02/17 1423  AST 17  ALT 13*  ALKPHOS 78  BILITOT 0.6  PROT 7.0  ALBUMIN 3.9   No results for input(s): LIPASE, AMYLASE in the last 168 hours. No results for input(s): AMMONIA in the last 168 hours.  CBC:  Recent Labs Lab 01/02/17 1423 01/03/17 0447  WBC 9.0 8.2  HGB 13.8 12.8  HCT 39.9 37.0  MCV 92.6 94.0  PLT 192 167    Cardiac Enzymes: No results for input(s): CKTOTAL, CKMB, CKMBINDEX, TROPONINI in the last 168 hours.  Lipid Panel:  Recent Labs Lab 01/02/17 1826  CHOL 163  TRIG 271*  HDL 49  CHOLHDL 3.3  VLDL 54*  LDLCALC 60    CBG:  Recent Labs Lab 01/04/17 1145 01/04/17 1645 01/04/17 2036 01/05/17 0728 01/05/17 1145  GLUCAP 169* 146* 197* 204* 210*    Microbiology: Results for orders placed or performed during the hospital encounter of 01/02/17  Urine culture     Status: Abnormal   Collection Time: 01/02/17  2:28 PM  Result Value Ref Range Status   Specimen Description URINE, RANDOM  Final   Special Requests NONE  Final   Culture MULTIPLE SPECIES PRESENT, SUGGEST RECOLLECTION (A)  Final   Report Status 01/04/2017 FINAL  Final    Coagulation Studies: No results for input(s): LABPROT, INR in the last 72 hours.  Imaging: No results found.  Medications:  I have reviewed the patient's current medications. Scheduled: . aspirin EC  81 mg Oral Daily  . calcium-vitamin D  1 tablet Oral Q breakfast  . cephALEXin  500 mg Oral Q8H  . clopidogrel  75 mg Oral Daily  .  glimepiride  4 mg Oral Daily  . heparin  5,000 Units Subcutaneous Q8H  . insulin aspart  0-15 Units Subcutaneous TID WC  . letrozole  2.5 mg Oral Daily  . nystatin   Topical BID  . simvastatin  10 mg Oral QHS  . sodium chloride flush  3 mL Intravenous Q12H  . tuberculin  5 Units Intradermal Once    Assessment/Plan: Patient neurologically stable.  On ASA and Plavix.  Extensive conversation had with patient and family this morning that included SW  and attending.  Family and patient to settle on discharge disposition.       LOS: 3 days   Alexis Goodell, MD Neurology [phone] 01/05/2017  12:36 PM

## 2017-01-05 NOTE — Progress Notes (Signed)
[facility]    MR#:  [id number]  DATE OF BIRTH:  1941-06-06  SUBJECTIVE:  Doing well, Family meeting this am with SW and Neurologist at bedside They are concerned about patient being discharged home and would like assisted living facility as skilled nursing facility is not an option REVIEW OF SYSTEMS:    Review of Systems  Constitutional: Negative.  Negative for chills, fever and malaise/fatigue.  HENT: Negative.  Negative for ear discharge, ear pain, hearing loss, nosebleeds and sore throat.   Eyes: Negative.  Negative for blurred vision and pain.  Respiratory: Negative.  Negative for cough, hemoptysis, shortness of breath and wheezing.   Cardiovascular: Negative.  Negative for chest pain, palpitations and leg swelling.  Gastrointestinal: Negative.  Negative for abdominal pain, blood in stool, diarrhea, nausea and vomiting.  Genitourinary: Negative.  Negative for dysuria.  Musculoskeletal: Negative.  Negative for back pain.  Skin: Negative.   Neurological: Negative for dizziness, tremors, speech change, focal weakness, seizures and headaches.  Endo/Heme/Allergies: Negative.  Does not bruise/bleed easily.  Psychiatric/Behavioral: Positive for memory loss. Negative for depression, hallucinations and suicidal ideas.    Tolerating Diet: yes DRUG ALLERGIES:   Allergies  Allergen Reactions  . Penicillins     VITALS:  Blood pressure (!) 153/72, pulse 72, temperature 98.3 F (36.8 C), resp. rate 18, height 5\' 4"  (1.626 m), weight 85.4 kg (188 lb 3.2 oz), SpO2 98 %.  PHYSICAL EXAMINATION:   Physical Exam  Constitutional: She is well-developed, well-nourished, and in no distress. No distress.  HENT:  Head: Normocephalic.  Eyes: No scleral icterus.  Neck: Normal range of motion. Neck supple. No JVD present. No tracheal deviation present.  Cardiovascular: Normal rate, regular rhythm and normal heart sounds.  Exam  reveals no gallop and no friction rub.   No murmur heard. Pulmonary/Chest: Effort normal and breath sounds normal. No respiratory distress. She has no wheezes. She has no rales. She exhibits no tenderness.  Abdominal: Soft. Bowel sounds are normal. She exhibits no distension and no mass. There is no tenderness. There is no rebound and no guarding.  Musculoskeletal: Normal range of motion. She exhibits no edema.  Neurological: She is alert.  To name and place and month not year  Skin: Skin is warm. No rash noted. No erythema.  Psychiatric: Affect and judgment normal.   LABORATORY PANEL:   CBC  Recent Labs Lab 01/03/17 0447  WBC 8.2  HGB 12.8  HCT 37.0  PLT 167   ------------------------------------------------------------------------------------------------------------------  Chemistries   Recent Labs Lab 01/02/17 1423  01/05/17 0347  NA 139  < > 137  K 3.9  < > 4.0  CL 109  < > 107  CO2 19*  < > 24  GLUCOSE 355*  < > 215*  BUN 27*  < > 21*  CREATININE 1.15*  < > 0.86  CALCIUM 9.4  < > 9.1  AST 17  --   --   ALT 13*  --   --   ALKPHOS 78  --   --   BILITOT 0.6  --   --   < > = values in this interval not displayed. ------------------------------------------------------------------------------------------------------------------  Cardiac Enzymes No results for input(s): TROPONINI in the last 168 hours. ------------------------------------------------------------------------------------------------------------------  RADIOLOGY:  No results found.   ASSESSMENT AND PLAN:   76 year old female with a history of diabetes and essential hypertension who presented with altered mental status and found to  have hyperglycemia and acute/early subacute left PCA territory infarct.  1. acute left PCA territory infarct:  Patient underwent stroke workup with MRI, carotid ultrasound and echocardiogram. MRI was consistent with left PCA territory infarct. Carotid Dopplers showed  no hemodynamically significant stenosis ECHO shows no source of thrombus  1. 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 2. No significant anterior circulation stenosis. 3. No cervical carotid or vertebral artery stenosis. 4. 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   Patient will be on aspirin, Plavix and continue statin  His therapy is recommending home health at discharge with supervision for the first few days, however family gravely concerned about d/c plan. Per SW - she doesn't qualify for STR/SNF Plan was to let her go but notified by CSW later today ALF is not ready to take her tonight so will have to be tomorrow  2. Uncontrolled diabetes: Hemoglobin A1c 9.2 Continue sliding scale Continue Amaryl  3. Essential hypertension: stable  4. Urinary tract infection: Changed to Keflex 5. Hypokalemia: Repleted  6. History of breast cancer on Femara   Management plans discussed with the patient and family and she is in agreement.  CODE STATUS: full  TOTAL TIME TAKING CARE OF THIS PATIENT: 45 minutes. (Including family meeting this morning)    POSSIBLE D/C tomorrow, DEPENDING ON CLINICAL CONDITION.  Follow-up on PPD results  Max Sane M.D on 01/05/2017 at 5:15 PM  Between 7am to 6pm - Pager - [phone] After 6pm go to [email or web address] - password [email or web address] [facility]  Office  [phone]  CC: Primary care physician; Wilhemena Durie, MD  Note: This dictation was prepared with Dragon dictation along with smaller phrase technology. Any transcriptional errors that result from this process are unintentional.

## 2017-01-05 NOTE — Care Management (Signed)
Discussed agency preferences with son and other family members at the bedside. [facility]. Telephone call to [place] [facility]. They can except Ms. Minch for services. Shelbie Ammons RN MSN CCM Care Management

## 2017-01-05 NOTE — NC FL2 (Signed)
[place] LEVEL OF CARE SCREENING TOOL     IDENTIFICATION  Patient Name: Lori Berger Birthdate: 08/29/1941 Sex: female Admission Date (Current Location): 01/02/2017  [facility] and [place] Number:  Engineering geologist and Address:  [facility], [address] [place], [place] [address]      Provider Number: [id number]  Attending Physician Name and Address:  Max Sane, MD  Relative Name and Phone Number:       Current Level of Care: Hospital Recommended Level of Care: [place] Prior Approval Number:    Date Approved/Denied:   PASRR Number:    Discharge Plan: Domiciliary (Rest home)    Current Diagnoses: Patient Active Problem List   Diagnosis Date Noted  . Stroke ([place]) 01/02/2017  . Acute lower UTI 01/02/2017  . Stroke (cerebrum) ([place]) 01/02/2017  . Personal history of malignant neoplasm of breast 06/29/2016  . Malignant neoplasm of right female breast ([place]) 03/30/2016  . Loss of weight 03/30/2016  . Faintness 01/26/2016  . Osteopenia 01/26/2016  . Breast cancer, female, right 09/26/2015  . Basal cell carcinoma of face 02/07/2015  . Diabetes mellitus, type 2 ([place]) 02/07/2015  . Dysfunction of eustachian tube 02/07/2015  . 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 Malignant neoplasm of corpus uteri, except isthmus ([place]) 02/07/2015  . Polyp of corpus uteri 02/07/2015  . Essential (primary) hypertension 02/07/2015  . Need for prophylactic hormone replacement therapy (postmenopausal) 02/07/2015  . Decreased potassium in the blood 02/07/2015  . 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 Malaise and fatigue 02/07/2015  . Combined fat and carbohydrate induced hyperlipemia 02/07/2015  . Adiposity 02/07/2015  . Arthritis, degenerative 02/07/2015  . Allergic rhinitis 02/07/2015  . Postmenopausal bleeding 02/07/2015  . Psychosis 02/07/2015  . Inflammation of sacroiliac joint ([place]) 02/07/2015     Orientation RESPIRATION BLADDER Height & Weight     Self, Place  Normal Continent Weight: 188 lb 3.2 oz (85.4 kg) Height:  5\' 4"  (162.6 cm)  BEHAVIORAL SYMPTOMS/MOOD NEUROLOGICAL BOWEL NUTRITION STATUS      Continent Diet (Heart Health, Carb Modified, Thin Liquids)  AMBULATORY STATUS COMMUNICATION OF NEEDS Skin   Supervision Verbally Normal                       Personal Care Assistance Level of Assistance  Bathing, Feeding, Dressing Bathing Assistance: Limited assistance Feeding assistance: Independent Dressing Assistance: Limited assistance     Functional Limitations Info  Sight, Hearing, Speech Sight Info: Adequate Hearing Info: Adequate Speech Info: Adequate    SPECIAL CARE FACTORS FREQUENCY  PT (By licensed PT), OT (By licensed OT), Speech therapy                    Contractures Contractures Info: Not present    Additional Factors Info  Code Status, Allergies Code Status Info: DNR Allergies Info: Penicillins          Discharge Medications: Please see discharge summary for a list of discharge medications.  Current Discharge Medication List       START taking these medications   Details  aspirin (ASPIRIN CHILDRENS) 81 MG chewable tablet Chew 1 tablet (81 mg total) by mouth daily. Qty: 120 tablet, Refills: 0    clopidogrel (PLAVIX) 75 MG tablet Take 1 tablet (75 mg total) by mouth daily. Qty: 30 tablet, Refills: 0         CONTINUE these medications which have CHANGED   Details  ibuprofen (  ADVIL) 200 MG tablet Take 1 tablet (200 mg total) by mouth every 6 (six) hours as needed for moderate pain. Qty: 30 tablet, Refills: 0         CONTINUE these medications which have NOT CHANGED   Details  amLODipine (NORVASC) 5 MG tablet TAKE 1 TABLET BY MOUTH ONCE A DAY Qty: 30 tablet, Refills: 12    Calcium Carb-Cholecalciferol (CALCIUM 600+D) 600-800 MG-UNIT TABS Take 1 tablet by mouth daily.    glimepiride (AMARYL) 4 MG tablet TAKE  1 TABLET BY MOUTH ONCE A DAY Qty: 30 tablet, Refills: 12    INVOKAMET 150-500 MG TABS TAKE 1 TABLET BY MOUTH TWICE A DAY Qty: 60 tablet, Refills: 11    letrozole (FEMARA) 2.5 MG tablet Take 1 tablet (2.5 mg total) by mouth daily. Qty: 30 tablet, Refills: 12    lisinopril (PRINIVIL,ZESTRIL) 20 MG tablet TAKE 1 TABLET BY MOUTH ONCE A DAY Qty: 30 tablet, Refills: 12    simvastatin (ZOCOR) 10 MG tablet Take 1 tablet (10 mg total) by mouth at bedtime. Qty: 90 tablet, Refills: 3          Relevant Imaging Results:  Relevant Lab Results:   Additional Information SSN:  [id number]   HHPT/OT/SLP with Julieta Gutting, LCSW

## 2017-01-06 LAB — GLUCOSE, CAPILLARY
Glucose-Capillary: 205 mg/dL — ABNORMAL HIGH (ref 65–99)
Glucose-Capillary: 251 mg/dL — ABNORMAL HIGH (ref 65–99)
Glucose-Capillary: 262 mg/dL — ABNORMAL HIGH (ref 65–99)

## 2017-01-06 MED ORDER — IBUPROFEN 200 MG PO TABS: 200.0000 mg | ORAL_TABLET | Freq: Four times a day (QID) | ORAL | 0 refills | Status: DC | PRN

## 2017-01-06 NOTE — Progress Notes (Signed)
Inpatient Diabetes Program Recommendations  AACE/ADA: New Consensus Statement on Inpatient Glycemic Control (2015)  Target Ranges:  Prepandial:   less than 140 mg/dL      Peak postprandial:   less than 180 mg/dL (1-2 hours)      Critically ill patients:  140 - 180 mg/dL   Lab Results  Component Value Date   GLUCAP 205 (H) 01/06/2017   HGBA1C 9.2 (H) 01/02/2017    Review of Glycemic Control  Results for [name], [name] (MRN [id number]) as of 01/06/2017 07:57  Ref. Range 01/05/2017 07:28 01/05/2017 11:45 01/05/2017 16:49 01/05/2017 21:00 01/06/2017 07:44  Glucose-Capillary Latest Ref Range: 65 - 99 mg/dL 204 (H) 210 (H) 160 (H) 210 (H) 205 (H)    Diabetes history: Type 2 Outpatient Diabetes medications:Amaryl 4mg  q day, Invokamet 150-500mg  1 bid  Current orders for Inpatient glycemic control: Amaryl 4mg /day, Novolog 0-15 units tid  Inpatient Diabetes Program Recommendations:  Consider adding Novolog 0-5 units qhs.while inpatient.  She may do better with d/c Amaryl and start once a day Lantus with the Invokamet.  This would decrease risk of hypoglycemia and gives [place] the opportunity to improve her A1C (CBG remain uncontrolled on current regime).   At discharge, Consider Lantus 13 units qhs. Continue Invokamet at discharge. (d/c Amaryl)   Gentry Fitz, RN, [place], [place], CDE Diabetes Coordinator Inpatient Diabetes Program  [phone] (Team Pager) [phone] ([place]) 01/06/2017 7:58 AM

## 2017-01-12 ENCOUNTER — Telehealth: Payer: Self-pay | Admitting: Family Medicine

## 2017-01-12 DIAGNOSIS — Z7982 Long term (current) use of aspirin: Secondary | ICD-10-CM | POA: Diagnosis not present

## 2017-01-12 DIAGNOSIS — Z7984 Long term (current) use of oral hypoglycemic drugs: Secondary | ICD-10-CM | POA: Diagnosis not present

## 2017-01-12 DIAGNOSIS — Z8541 Personal history of malignant neoplasm of cervix uteri: Secondary | ICD-10-CM | POA: Diagnosis not present

## 2017-01-12 DIAGNOSIS — Z85828 Personal history of other malignant neoplasm of skin: Secondary | ICD-10-CM | POA: Diagnosis not present

## 2017-01-12 DIAGNOSIS — Z853 Personal history of malignant neoplasm of breast: Secondary | ICD-10-CM | POA: Diagnosis not present

## 2017-01-12 DIAGNOSIS — I10 Essential (primary) hypertension: Secondary | ICD-10-CM | POA: Diagnosis not present

## 2017-01-12 DIAGNOSIS — Z7902 Long term (current) use of antithrombotics/antiplatelets: Secondary | ICD-10-CM | POA: Diagnosis not present

## 2017-01-12 DIAGNOSIS — I69398 Other sequelae of cerebral infarction: Secondary | ICD-10-CM | POA: Diagnosis not present

## 2017-01-12 DIAGNOSIS — Z9181 History of falling: Secondary | ICD-10-CM | POA: Diagnosis not present

## 2017-01-12 DIAGNOSIS — M6281 Muscle weakness (generalized): Secondary | ICD-10-CM | POA: Diagnosis not present

## 2017-01-12 DIAGNOSIS — Z87440 Personal history of urinary (tract) infections: Secondary | ICD-10-CM | POA: Diagnosis not present

## 2017-01-12 DIAGNOSIS — M8580 Other specified disorders of bone density and structure, unspecified site: Secondary | ICD-10-CM | POA: Diagnosis not present

## 2017-01-12 DIAGNOSIS — E119 Type 2 diabetes mellitus without complications: Secondary | ICD-10-CM | POA: Diagnosis not present

## 2017-01-12 DIAGNOSIS — M1991 Primary osteoarthritis, unspecified site: Secondary | ICD-10-CM | POA: Diagnosis not present

## 2017-01-12 DIAGNOSIS — F340 Cyclothymic disorder: Secondary | ICD-10-CM | POA: Diagnosis not present

## 2017-01-12 NOTE — Telephone Encounter (Signed)
[  please review-aa 

## 2017-01-12 NOTE — Telephone Encounter (Signed)
Mickel Baas with [facility] called to request a verbal order for Physical therapy 2 times a week for 4 weeks.  DT#[phone]

## 2017-01-12 NOTE — Telephone Encounter (Signed)
Advised  ED 

## 2017-01-12 NOTE — Telephone Encounter (Signed)
ok 

## 2017-01-14 DIAGNOSIS — F340 Cyclothymic disorder: Secondary | ICD-10-CM | POA: Diagnosis not present

## 2017-01-14 DIAGNOSIS — M6281 Muscle weakness (generalized): Secondary | ICD-10-CM | POA: Diagnosis not present

## 2017-01-14 DIAGNOSIS — E119 Type 2 diabetes mellitus without complications: Secondary | ICD-10-CM | POA: Diagnosis not present

## 2017-01-14 DIAGNOSIS — M8580 Other specified disorders of bone density and structure, unspecified site: Secondary | ICD-10-CM | POA: Diagnosis not present

## 2017-01-14 DIAGNOSIS — M1991 Primary osteoarthritis, unspecified site: Secondary | ICD-10-CM | POA: Diagnosis not present

## 2017-01-14 DIAGNOSIS — I69398 Other sequelae of cerebral infarction: Secondary | ICD-10-CM | POA: Diagnosis not present

## 2017-01-15 DIAGNOSIS — M1991 Primary osteoarthritis, unspecified site: Secondary | ICD-10-CM | POA: Diagnosis not present

## 2017-01-15 DIAGNOSIS — E119 Type 2 diabetes mellitus without complications: Secondary | ICD-10-CM | POA: Diagnosis not present

## 2017-01-15 DIAGNOSIS — F340 Cyclothymic disorder: Secondary | ICD-10-CM | POA: Diagnosis not present

## 2017-01-15 DIAGNOSIS — M6281 Muscle weakness (generalized): Secondary | ICD-10-CM | POA: Diagnosis not present

## 2017-01-15 DIAGNOSIS — I69398 Other sequelae of cerebral infarction: Secondary | ICD-10-CM | POA: Diagnosis not present

## 2017-01-15 DIAGNOSIS — M8580 Other specified disorders of bone density and structure, unspecified site: Secondary | ICD-10-CM | POA: Diagnosis not present

## 2017-01-17 ENCOUNTER — Telehealth: Payer: Self-pay | Admitting: Family Medicine

## 2017-01-17 DIAGNOSIS — M1991 Primary osteoarthritis, unspecified site: Secondary | ICD-10-CM | POA: Diagnosis not present

## 2017-01-17 DIAGNOSIS — F340 Cyclothymic disorder: Secondary | ICD-10-CM | POA: Diagnosis not present

## 2017-01-17 DIAGNOSIS — M8580 Other specified disorders of bone density and structure, unspecified site: Secondary | ICD-10-CM | POA: Diagnosis not present

## 2017-01-17 DIAGNOSIS — E119 Type 2 diabetes mellitus without complications: Secondary | ICD-10-CM | POA: Diagnosis not present

## 2017-01-17 DIAGNOSIS — I69398 Other sequelae of cerebral infarction: Secondary | ICD-10-CM | POA: Diagnosis not present

## 2017-01-17 DIAGNOSIS — M6281 Muscle weakness (generalized): Secondary | ICD-10-CM | POA: Diagnosis not present

## 2017-01-17 NOTE — Telephone Encounter (Signed)
Advised 

## 2017-01-17 NOTE — Telephone Encounter (Signed)
Santiago Glad with [facility] is requesting a verbal order speech therapy 2 times a week for 2 weeks and 1 time a week for 2 weeks.  CB#[phone]/MW

## 2017-01-18 DIAGNOSIS — H53461 Homonymous bilateral field defects, right side: Secondary | ICD-10-CM | POA: Diagnosis not present

## 2017-01-18 LAB — HM DIABETES EYE EXAM

## 2017-01-19 DIAGNOSIS — M6281 Muscle weakness (generalized): Secondary | ICD-10-CM | POA: Diagnosis not present

## 2017-01-19 DIAGNOSIS — E119 Type 2 diabetes mellitus without complications: Secondary | ICD-10-CM | POA: Diagnosis not present

## 2017-01-19 DIAGNOSIS — M1991 Primary osteoarthritis, unspecified site: Secondary | ICD-10-CM | POA: Diagnosis not present

## 2017-01-19 DIAGNOSIS — I69398 Other sequelae of cerebral infarction: Secondary | ICD-10-CM | POA: Diagnosis not present

## 2017-01-19 DIAGNOSIS — M8580 Other specified disorders of bone density and structure, unspecified site: Secondary | ICD-10-CM | POA: Diagnosis not present

## 2017-01-19 DIAGNOSIS — F340 Cyclothymic disorder: Secondary | ICD-10-CM | POA: Diagnosis not present

## 2017-01-20 DIAGNOSIS — M8580 Other specified disorders of bone density and structure, unspecified site: Secondary | ICD-10-CM | POA: Diagnosis not present

## 2017-01-20 DIAGNOSIS — F340 Cyclothymic disorder: Secondary | ICD-10-CM | POA: Diagnosis not present

## 2017-01-20 DIAGNOSIS — E119 Type 2 diabetes mellitus without complications: Secondary | ICD-10-CM | POA: Diagnosis not present

## 2017-01-20 DIAGNOSIS — I69398 Other sequelae of cerebral infarction: Secondary | ICD-10-CM | POA: Diagnosis not present

## 2017-01-20 DIAGNOSIS — M6281 Muscle weakness (generalized): Secondary | ICD-10-CM | POA: Diagnosis not present

## 2017-01-20 DIAGNOSIS — M1991 Primary osteoarthritis, unspecified site: Secondary | ICD-10-CM | POA: Diagnosis not present

## 2017-01-21 DIAGNOSIS — F340 Cyclothymic disorder: Secondary | ICD-10-CM | POA: Diagnosis not present

## 2017-01-21 DIAGNOSIS — I69398 Other sequelae of cerebral infarction: Secondary | ICD-10-CM | POA: Diagnosis not present

## 2017-01-21 DIAGNOSIS — M8580 Other specified disorders of bone density and structure, unspecified site: Secondary | ICD-10-CM | POA: Diagnosis not present

## 2017-01-21 DIAGNOSIS — E119 Type 2 diabetes mellitus without complications: Secondary | ICD-10-CM | POA: Diagnosis not present

## 2017-01-21 DIAGNOSIS — M6281 Muscle weakness (generalized): Secondary | ICD-10-CM | POA: Diagnosis not present

## 2017-01-21 DIAGNOSIS — M1991 Primary osteoarthritis, unspecified site: Secondary | ICD-10-CM | POA: Diagnosis not present

## 2017-01-25 ENCOUNTER — Telehealth: Payer: Self-pay | Admitting: Family Medicine

## 2017-01-25 DIAGNOSIS — I69398 Other sequelae of cerebral infarction: Secondary | ICD-10-CM | POA: Diagnosis not present

## 2017-01-25 DIAGNOSIS — M8580 Other specified disorders of bone density and structure, unspecified site: Secondary | ICD-10-CM | POA: Diagnosis not present

## 2017-01-25 DIAGNOSIS — F340 Cyclothymic disorder: Secondary | ICD-10-CM | POA: Diagnosis not present

## 2017-01-25 DIAGNOSIS — M1991 Primary osteoarthritis, unspecified site: Secondary | ICD-10-CM | POA: Diagnosis not present

## 2017-01-25 DIAGNOSIS — E119 Type 2 diabetes mellitus without complications: Secondary | ICD-10-CM | POA: Diagnosis not present

## 2017-01-25 DIAGNOSIS — M6281 Muscle weakness (generalized): Secondary | ICD-10-CM | POA: Diagnosis not present

## 2017-01-25 NOTE — Telephone Encounter (Signed)
Facility needs to have the form signed and sent back in order for them to do the testing.  It was sent over and given to Dr Rosanna Randy to sign.  Please have Dr Rosanna Randy sign so we can fax back to the facioity.

## 2017-01-25 NOTE — Telephone Encounter (Signed)
Faxing order now on RX pad due to not been able to locate fax from [facility] at this time.-aa

## 2017-01-25 NOTE — Telephone Encounter (Signed)
Linda with [name] states she has faxed a [employer] fax sheet asking if pt blood sugar should be checked and how many time a day it should be checked.  She is requesting a written order if this needs to be done. The family is concerned that this is not being done.  Fax [phone].  VV#[phone]/[phone]

## 2017-01-25 NOTE — Telephone Encounter (Signed)
Just fasting daily for now.

## 2017-01-25 NOTE — Telephone Encounter (Signed)
Please review, I have seen this fax on green chart sent to you. Thank you-aa

## 2017-01-27 DIAGNOSIS — M6281 Muscle weakness (generalized): Secondary | ICD-10-CM | POA: Diagnosis not present

## 2017-01-27 DIAGNOSIS — M8580 Other specified disorders of bone density and structure, unspecified site: Secondary | ICD-10-CM | POA: Diagnosis not present

## 2017-01-27 DIAGNOSIS — F340 Cyclothymic disorder: Secondary | ICD-10-CM | POA: Diagnosis not present

## 2017-01-27 DIAGNOSIS — E119 Type 2 diabetes mellitus without complications: Secondary | ICD-10-CM | POA: Diagnosis not present

## 2017-01-27 DIAGNOSIS — M1991 Primary osteoarthritis, unspecified site: Secondary | ICD-10-CM | POA: Diagnosis not present

## 2017-01-27 DIAGNOSIS — I69398 Other sequelae of cerebral infarction: Secondary | ICD-10-CM | POA: Diagnosis not present

## 2017-02-01 DIAGNOSIS — E119 Type 2 diabetes mellitus without complications: Secondary | ICD-10-CM | POA: Diagnosis not present

## 2017-02-01 DIAGNOSIS — M6281 Muscle weakness (generalized): Secondary | ICD-10-CM | POA: Diagnosis not present

## 2017-02-01 DIAGNOSIS — M1991 Primary osteoarthritis, unspecified site: Secondary | ICD-10-CM | POA: Diagnosis not present

## 2017-02-01 DIAGNOSIS — M8580 Other specified disorders of bone density and structure, unspecified site: Secondary | ICD-10-CM | POA: Diagnosis not present

## 2017-02-01 DIAGNOSIS — F340 Cyclothymic disorder: Secondary | ICD-10-CM | POA: Diagnosis not present

## 2017-02-01 DIAGNOSIS — I69398 Other sequelae of cerebral infarction: Secondary | ICD-10-CM | POA: Diagnosis not present

## 2017-02-03 ENCOUNTER — Telehealth: Payer: Self-pay | Admitting: Family Medicine

## 2017-02-03 DIAGNOSIS — F340 Cyclothymic disorder: Secondary | ICD-10-CM | POA: Diagnosis not present

## 2017-02-03 DIAGNOSIS — M8580 Other specified disorders of bone density and structure, unspecified site: Secondary | ICD-10-CM | POA: Diagnosis not present

## 2017-02-03 DIAGNOSIS — I69398 Other sequelae of cerebral infarction: Secondary | ICD-10-CM | POA: Diagnosis not present

## 2017-02-03 DIAGNOSIS — M1991 Primary osteoarthritis, unspecified site: Secondary | ICD-10-CM | POA: Diagnosis not present

## 2017-02-03 DIAGNOSIS — M6281 Muscle weakness (generalized): Secondary | ICD-10-CM | POA: Diagnosis not present

## 2017-02-03 DIAGNOSIS — E119 Type 2 diabetes mellitus without complications: Secondary | ICD-10-CM | POA: Diagnosis not present

## 2017-02-03 NOTE — Telephone Encounter (Signed)
Ok to do this?-aa

## 2017-02-03 NOTE — Telephone Encounter (Signed)
ok 

## 2017-02-03 NOTE — Telephone Encounter (Signed)
Lori Berger with [facility] is wanting to continue PT for 2 more weeks 2 times a week.  Thanks, [employer]

## 2017-02-04 NOTE — Telephone Encounter (Signed)
Lori Berger with [name]

## 2017-02-08 ENCOUNTER — Encounter: Payer: Medicare Other | Admitting: Family Medicine

## 2017-02-08 DIAGNOSIS — I69398 Other sequelae of cerebral infarction: Secondary | ICD-10-CM | POA: Diagnosis not present

## 2017-02-08 DIAGNOSIS — M1991 Primary osteoarthritis, unspecified site: Secondary | ICD-10-CM | POA: Diagnosis not present

## 2017-02-08 DIAGNOSIS — E119 Type 2 diabetes mellitus without complications: Secondary | ICD-10-CM | POA: Diagnosis not present

## 2017-02-08 DIAGNOSIS — M8580 Other specified disorders of bone density and structure, unspecified site: Secondary | ICD-10-CM | POA: Diagnosis not present

## 2017-02-08 DIAGNOSIS — M6281 Muscle weakness (generalized): Secondary | ICD-10-CM | POA: Diagnosis not present

## 2017-02-08 DIAGNOSIS — F340 Cyclothymic disorder: Secondary | ICD-10-CM | POA: Diagnosis not present

## 2017-02-09 ENCOUNTER — Telehealth: Payer: Self-pay | Admitting: Family Medicine

## 2017-02-09 DIAGNOSIS — M1991 Primary osteoarthritis, unspecified site: Secondary | ICD-10-CM | POA: Diagnosis not present

## 2017-02-09 DIAGNOSIS — I69398 Other sequelae of cerebral infarction: Secondary | ICD-10-CM | POA: Diagnosis not present

## 2017-02-09 DIAGNOSIS — M6281 Muscle weakness (generalized): Secondary | ICD-10-CM | POA: Diagnosis not present

## 2017-02-09 DIAGNOSIS — F340 Cyclothymic disorder: Secondary | ICD-10-CM | POA: Diagnosis not present

## 2017-02-09 DIAGNOSIS — M8580 Other specified disorders of bone density and structure, unspecified site: Secondary | ICD-10-CM | POA: Diagnosis not present

## 2017-02-09 DIAGNOSIS — E119 Type 2 diabetes mellitus without complications: Secondary | ICD-10-CM | POA: Diagnosis not present

## 2017-02-09 NOTE — Telephone Encounter (Signed)
Dr Rosanna Randy can you please call?-aa

## 2017-02-09 NOTE — Telephone Encounter (Signed)
Pt's son Ed would like to talk to Dr. Rosanna Randy about mom's ongoing care.  She is talking about wanting to go home from assisted living but they do not feel she needs to be at home alone.  His call back is [phone]  Pt has an appt tomorrow with Dr. Darnell Level at 1:45.  He would like to talk to Dr. Darnell Level before that appt.  Thanks, [employer]

## 2017-02-10 ENCOUNTER — Encounter: Payer: Self-pay | Admitting: Family Medicine

## 2017-02-10 ENCOUNTER — Ambulatory Visit (INDEPENDENT_AMBULATORY_CARE_PROVIDER_SITE_OTHER): Payer: Medicare Other | Admitting: Family Medicine

## 2017-02-10 VITALS — BP 132/72 | HR 88 | Temp 98.5°F | Resp 16 | Wt 187.0 lb

## 2017-02-10 DIAGNOSIS — I69398 Other sequelae of cerebral infarction: Secondary | ICD-10-CM | POA: Diagnosis not present

## 2017-02-10 DIAGNOSIS — Z111 Encounter for screening for respiratory tuberculosis: Secondary | ICD-10-CM

## 2017-02-10 DIAGNOSIS — F340 Cyclothymic disorder: Secondary | ICD-10-CM | POA: Diagnosis not present

## 2017-02-10 DIAGNOSIS — E785 Hyperlipidemia, unspecified: Secondary | ICD-10-CM | POA: Diagnosis not present

## 2017-02-10 DIAGNOSIS — N390 Urinary tract infection, site not specified: Secondary | ICD-10-CM

## 2017-02-10 DIAGNOSIS — E119 Type 2 diabetes mellitus without complications: Secondary | ICD-10-CM

## 2017-02-10 DIAGNOSIS — I10 Essential (primary) hypertension: Secondary | ICD-10-CM

## 2017-02-10 DIAGNOSIS — I639 Cerebral infarction, unspecified: Secondary | ICD-10-CM | POA: Diagnosis not present

## 2017-02-10 DIAGNOSIS — M6281 Muscle weakness (generalized): Secondary | ICD-10-CM | POA: Diagnosis not present

## 2017-02-10 DIAGNOSIS — M8580 Other specified disorders of bone density and structure, unspecified site: Secondary | ICD-10-CM | POA: Diagnosis not present

## 2017-02-10 DIAGNOSIS — M1991 Primary osteoarthritis, unspecified site: Secondary | ICD-10-CM | POA: Diagnosis not present

## 2017-02-10 NOTE — Progress Notes (Signed)
Patient: Lori Berger Female    DOB: 09-Sep-1941   76 y.o.   MRN: [id number] Visit Date: 02/10/2017  Today's Provider: Wilhemena Durie, MD   Chief Complaint  Patient presents with  . Hospitalization Follow-up  . Hypertension  . Hyperlipidemia  . Diabetes   Subjective:    HPI  Follow up Hospitalization  Patient was admitted to [facility] on 01/02/2017 and discharged on 01/06/2017. She was treated for CVA. Treatment for this included daily aspirin, plavix, and simvastatin. She reports good compliance with treatment. She reports this condition is Improved.     Diabetes Mellitus Type II, Follow-up:   Lab Results  Component Value Date   HGBA1C 9.2 (H) 01/02/2017   HGBA1C 9.2 (H) 10/19/2016   HGBA1C 8.5 10/21/2015    Last seen for diabetes 4 months ago.  Management since then includes no changes. She reports good compliance with treatment. She is not having side effects.  Current symptoms include none and have been stable. Home blood sugar records: fasting range: 132 this morning  Episodes of hypoglycemia? no   Current Insulin Regimen: none Most Recent Eye Exam: due Weight trend: stable Prior visit with dietician: no Current diet: well balanced Current exercise: none  Pertinent Labs:    Component Value Date/Time   CHOL 163 01/02/2017 1826   CHOL 249 (H) 10/19/2016 0955   TRIG 271 (H) 01/02/2017 1826   HDL 49 01/02/2017 1826   HDL 40 10/19/2016 0955   LDLCALC 60 01/02/2017 1826   LDLCALC 136 (H) 10/19/2016 0955   CREATININE 0.86 01/05/2017 0347    Wt Readings from Last 3 Encounters:  02/10/17 187 lb (84.8 kg)  01/02/17 188 lb 3.2 oz (85.4 kg)  10/14/16 195 lb (88.5 kg)      Lipid/Cholesterol, Follow-up:   Last seen for this4 months ago.  Management changes since that visit include starting back on Simvastatin. . Last Lipid Panel:    Component Value Date/Time   CHOL 163 01/02/2017 1826   CHOL 249 (H) 10/19/2016 0955   TRIG 271 (H)  01/02/2017 1826   HDL 49 01/02/2017 1826   HDL 40 10/19/2016 0955   CHOLHDL 3.3 01/02/2017 1826   VLDL 54 (H) 01/02/2017 1826   LDLCALC 60 01/02/2017 1826   LDLCALC 136 (H) 10/19/2016 0955    Risk factors for vascular disease include diabetes mellitus and hypertension  She reports good compliance with treatment. She is not having side effects.  Current symptoms include none and have been stable. Weight trend: stable Prior visit with dietician: no Current diet: well balanced Current exercise: none  Wt Readings from Last 3 Encounters:  02/10/17 187 lb (84.8 kg)  01/02/17 188 lb 3.2 oz (85.4 kg)  10/14/16 195 lb (88.5 kg)     Hypertension, follow-up:  BP Readings from Last 3 Encounters:  02/10/17 132/72  01/06/17 (!) 148/78  10/14/16 122/76    She was last seen for hypertension 4 months ago.  BP at that visit was 122/76. Management since that visit includes no changes. She reports excellent compliance with treatment. She is not having side effects.  She is not exercising. She is adherent to low salt diet.   Outside blood pressures are checked daily. She is experiencing none.  Patient denies exertional chest pressure/discomfort and palpitations.   Cardiovascular risk factors include diabetes mellitus and dyslipidemia.   Weight trend: stable Wt Readings from Last 3 Encounters:  02/10/17 187 lb (84.8 kg)  01/02/17 188 lb  3.2 oz (85.4 kg)  10/14/16 195 lb (88.5 kg)    Current diet: well balanced         Allergies  Allergen Reactions  . Penicillins      Current Outpatient Prescriptions:  .  amLODipine (NORVASC) 5 MG tablet, TAKE 1 TABLET BY MOUTH ONCE A DAY, Disp: 30 tablet, Rfl: 12 .  aspirin (ASPIRIN CHILDRENS) 81 MG chewable tablet, Chew 1 tablet (81 mg total) by mouth daily., Disp: 120 tablet, Rfl: 0 .  Calcium Carb-Cholecalciferol (CALCIUM 600+D) 600-800 MG-UNIT TABS, Take 1 tablet by mouth daily., Disp: , Rfl:  .  clopidogrel (PLAVIX) 75 MG tablet,  Take 1 tablet (75 mg total) by mouth daily., Disp: 30 tablet, Rfl: 0 .  glimepiride (AMARYL) 4 MG tablet, TAKE 1 TABLET BY MOUTH ONCE A DAY, Disp: 30 tablet, Rfl: 12 .  ibuprofen (ADVIL) 200 MG tablet, Take 1 tablet (200 mg total) by mouth every 6 (six) hours as needed for moderate pain., Disp: 30 tablet, Rfl: 0 .  INVOKAMET 150-500 MG TABS, TAKE 1 TABLET BY MOUTH TWICE A DAY, Disp: 60 tablet, Rfl: 11 .  letrozole (FEMARA) 2.5 MG tablet, Take 1 tablet (2.5 mg total) by mouth daily., Disp: 30 tablet, Rfl: 12 .  lisinopril (PRINIVIL,ZESTRIL) 20 MG tablet, TAKE 1 TABLET BY MOUTH ONCE A DAY, Disp: 30 tablet, Rfl: 12 .  simvastatin (ZOCOR) 10 MG tablet, Take 1 tablet (10 mg total) by mouth at bedtime., Disp: 90 tablet, Rfl: 3  Review of Systems  Constitutional: Negative.   HENT: Negative.   Eyes: Negative.   Respiratory: Negative.   Cardiovascular: Negative for chest pain, palpitations and leg swelling.  Endocrine: Negative for cold intolerance, heat intolerance, polydipsia, polyphagia and polyuria.  Musculoskeletal: Positive for gait problem.       Walks with a walker  Allergic/Immunologic: Negative.   Neurological: Negative for dizziness, facial asymmetry, speech difficulty, weakness, light-headedness, numbness and headaches.  Psychiatric/Behavioral: Negative.     Social History  Substance Use Topics  . Smoking status: Never Smoker  . Smokeless tobacco: Never Used  . Alcohol use No   Objective:   BP 132/72 (BP Location: Left Arm, Patient Position: Sitting, Cuff Size: Normal)   Pulse 88   Temp 98.5 F (36.9 C)   Resp 16   Wt 187 lb (84.8 kg)   SpO2 98%   BMI 32.10 kg/m  Vitals:   02/10/17 1403  BP: 132/72  Pulse: 88  Resp: 16  Temp: 98.5 F (36.9 C)  SpO2: 98%  Weight: 187 lb (84.8 kg)     Physical Exam  Constitutional: She is oriented to person, place, and time. She appears well-developed and well-nourished.  HENT:  Head: Normocephalic and atraumatic.  Eyes:  Conjunctivae are normal. No scleral icterus.  Neck: No thyromegaly present.  Cardiovascular: Normal rate, regular rhythm and normal heart sounds.   Pulmonary/Chest: Effort normal and breath sounds normal.  Neurological: She is alert and oriented to person, place, and time.  Skin: Skin is warm and dry.  Psychiatric: She has a normal mood and affect. Her behavior is normal. Judgment and thought content normal.        Assessment & Plan:     1. Cerebrovascular accident (CVA), unspecified mechanism ([place]) No focal deficit. P.t improving  2. Essential (primary) hypertension   3. Type 2 diabetes mellitus without complication, unspecified whether long term insulin use ([place])   4. Hyperlipidemia, unspecified hyperlipidemia type   5. Urinary tract infection without hematuria,  site unspecified   6. Screening for tuberculosis  - Quantiferon tb gold assay (blood) 7.Cognitive Impairement/Early Dementia Pt should not drive and I think she is safest continuing to live at [facility]. I do not think she could safely live independently.      I have done the exam and reviewed the above chart and it is accurate to the best of my knowledge. Development worker, community has been used in this note in any air is in the dictation or transcription are unintentional.  Wilhemena Durie, MD  [place]

## 2017-02-10 NOTE — Telephone Encounter (Signed)
Spoke with son.

## 2017-02-13 LAB — QUANTIFERON IN TUBE
QFT TB AG MINUS NIL VALUE: 0 IU/mL
QUANTIFERON MITOGEN VALUE: 6.31 IU/mL
QUANTIFERON TB AG VALUE: 0.05 IU/mL
QUANTIFERON TB GOLD: NEGATIVE
Quantiferon Nil Value: 0.05 IU/mL

## 2017-02-13 LAB — QUANTIFERON TB GOLD ASSAY (BLOOD)

## 2017-02-14 ENCOUNTER — Encounter: Payer: Self-pay | Admitting: Family Medicine

## 2017-02-15 ENCOUNTER — Telehealth: Payer: Self-pay

## 2017-02-15 DIAGNOSIS — M1991 Primary osteoarthritis, unspecified site: Secondary | ICD-10-CM | POA: Diagnosis not present

## 2017-02-15 DIAGNOSIS — I69398 Other sequelae of cerebral infarction: Secondary | ICD-10-CM | POA: Diagnosis not present

## 2017-02-15 DIAGNOSIS — M8580 Other specified disorders of bone density and structure, unspecified site: Secondary | ICD-10-CM | POA: Diagnosis not present

## 2017-02-15 DIAGNOSIS — F340 Cyclothymic disorder: Secondary | ICD-10-CM | POA: Diagnosis not present

## 2017-02-15 DIAGNOSIS — M6281 Muscle weakness (generalized): Secondary | ICD-10-CM | POA: Diagnosis not present

## 2017-02-15 DIAGNOSIS — E119 Type 2 diabetes mellitus without complications: Secondary | ICD-10-CM | POA: Diagnosis not present

## 2017-02-15 NOTE — Telephone Encounter (Signed)
For Theodore, on 02/10/17 one of the items addressed was a UTI without blood but nothing was mentioned about medication. Was this just for the follow-up from when it showed she had one in April in the hospital or did you determine she had one currently?  Thanks,  Mariann Laster

## 2017-02-17 DIAGNOSIS — M6281 Muscle weakness (generalized): Secondary | ICD-10-CM | POA: Diagnosis not present

## 2017-02-17 DIAGNOSIS — M8580 Other specified disorders of bone density and structure, unspecified site: Secondary | ICD-10-CM | POA: Diagnosis not present

## 2017-02-17 DIAGNOSIS — M1991 Primary osteoarthritis, unspecified site: Secondary | ICD-10-CM | POA: Diagnosis not present

## 2017-02-17 DIAGNOSIS — E119 Type 2 diabetes mellitus without complications: Secondary | ICD-10-CM | POA: Diagnosis not present

## 2017-02-17 DIAGNOSIS — F340 Cyclothymic disorder: Secondary | ICD-10-CM | POA: Diagnosis not present

## 2017-02-17 DIAGNOSIS — I69398 Other sequelae of cerebral infarction: Secondary | ICD-10-CM | POA: Diagnosis not present

## 2017-02-22 ENCOUNTER — Telehealth: Payer: Self-pay

## 2017-02-22 NOTE — Telephone Encounter (Signed)
Please review-aa 

## 2017-02-22 NOTE — Telephone Encounter (Signed)
Register Lori Berger is requesting a verbal request to continue speech therapy once a week for 3 more weeks.

## 2017-02-22 NOTE — Telephone Encounter (Signed)
If she is well and asymptomatic would do nothing. Can repeat urine culture /UA if needed

## 2017-02-23 NOTE — Telephone Encounter (Signed)
ok 

## 2017-02-23 NOTE — Telephone Encounter (Signed)
Santiago Glad advised.  Thanks,   -Mickel Baas

## 2017-02-24 DIAGNOSIS — M1991 Primary osteoarthritis, unspecified site: Secondary | ICD-10-CM | POA: Diagnosis not present

## 2017-02-24 DIAGNOSIS — E119 Type 2 diabetes mellitus without complications: Secondary | ICD-10-CM | POA: Diagnosis not present

## 2017-02-24 DIAGNOSIS — I69398 Other sequelae of cerebral infarction: Secondary | ICD-10-CM | POA: Diagnosis not present

## 2017-02-24 DIAGNOSIS — M6281 Muscle weakness (generalized): Secondary | ICD-10-CM | POA: Diagnosis not present

## 2017-02-24 DIAGNOSIS — M8580 Other specified disorders of bone density and structure, unspecified site: Secondary | ICD-10-CM | POA: Diagnosis not present

## 2017-02-24 DIAGNOSIS — F340 Cyclothymic disorder: Secondary | ICD-10-CM | POA: Diagnosis not present

## 2017-03-04 DIAGNOSIS — M6281 Muscle weakness (generalized): Secondary | ICD-10-CM | POA: Diagnosis not present

## 2017-03-04 DIAGNOSIS — E119 Type 2 diabetes mellitus without complications: Secondary | ICD-10-CM | POA: Diagnosis not present

## 2017-03-04 DIAGNOSIS — F340 Cyclothymic disorder: Secondary | ICD-10-CM | POA: Diagnosis not present

## 2017-03-04 DIAGNOSIS — M1991 Primary osteoarthritis, unspecified site: Secondary | ICD-10-CM | POA: Diagnosis not present

## 2017-03-04 DIAGNOSIS — M8580 Other specified disorders of bone density and structure, unspecified site: Secondary | ICD-10-CM | POA: Diagnosis not present

## 2017-03-04 DIAGNOSIS — I69398 Other sequelae of cerebral infarction: Secondary | ICD-10-CM | POA: Diagnosis not present

## 2017-03-08 DIAGNOSIS — E119 Type 2 diabetes mellitus without complications: Secondary | ICD-10-CM | POA: Diagnosis not present

## 2017-03-08 DIAGNOSIS — I69398 Other sequelae of cerebral infarction: Secondary | ICD-10-CM | POA: Diagnosis not present

## 2017-03-08 DIAGNOSIS — M1991 Primary osteoarthritis, unspecified site: Secondary | ICD-10-CM | POA: Diagnosis not present

## 2017-03-08 DIAGNOSIS — M8580 Other specified disorders of bone density and structure, unspecified site: Secondary | ICD-10-CM | POA: Diagnosis not present

## 2017-03-08 DIAGNOSIS — M6281 Muscle weakness (generalized): Secondary | ICD-10-CM | POA: Diagnosis not present

## 2017-03-08 DIAGNOSIS — F340 Cyclothymic disorder: Secondary | ICD-10-CM | POA: Diagnosis not present

## 2017-03-09 ENCOUNTER — Telehealth: Payer: Self-pay | Admitting: Family Medicine

## 2017-03-09 DIAGNOSIS — B354 Tinea corporis: Secondary | ICD-10-CM

## 2017-03-09 MED ORDER — NYSTATIN 100000 UNIT/GM EX POWD: Freq: Three times a day (TID) | CUTANEOUS | 0 refills | Status: DC

## 2017-03-09 NOTE — Telephone Encounter (Signed)
[id number] with [name] is requesting written orders for Nystatin powder to put under pt's stomach folds b/c pt has developed a rash. Lovena Neighbours was advised that Dr. Rosanna Randy is out of the office this week and we would send the message to another provider in the office.  Fax# [phone] Please advise. Thanks TNP

## 2017-03-09 NOTE — Telephone Encounter (Signed)
Rx printed to be faxed to [facility]

## 2017-03-09 NOTE — Telephone Encounter (Signed)
Please Review.  Thanks,  -Joseline 

## 2017-03-09 NOTE — Telephone Encounter (Signed)
Prescription faxed.  Thanks,  -Kassy Mcenroe

## 2017-03-13 DIAGNOSIS — Z8541 Personal history of malignant neoplasm of cervix uteri: Secondary | ICD-10-CM | POA: Diagnosis not present

## 2017-03-13 DIAGNOSIS — E119 Type 2 diabetes mellitus without complications: Secondary | ICD-10-CM | POA: Diagnosis not present

## 2017-03-13 DIAGNOSIS — M8580 Other specified disorders of bone density and structure, unspecified site: Secondary | ICD-10-CM | POA: Diagnosis not present

## 2017-03-13 DIAGNOSIS — Z7984 Long term (current) use of oral hypoglycemic drugs: Secondary | ICD-10-CM | POA: Diagnosis not present

## 2017-03-13 DIAGNOSIS — Z853 Personal history of malignant neoplasm of breast: Secondary | ICD-10-CM | POA: Diagnosis not present

## 2017-03-13 DIAGNOSIS — Z7982 Long term (current) use of aspirin: Secondary | ICD-10-CM | POA: Diagnosis not present

## 2017-03-13 DIAGNOSIS — R413 Other amnesia: Secondary | ICD-10-CM | POA: Diagnosis not present

## 2017-03-13 DIAGNOSIS — M1991 Primary osteoarthritis, unspecified site: Secondary | ICD-10-CM | POA: Diagnosis not present

## 2017-03-13 DIAGNOSIS — I69398 Other sequelae of cerebral infarction: Secondary | ICD-10-CM | POA: Diagnosis not present

## 2017-03-13 DIAGNOSIS — Z87440 Personal history of urinary (tract) infections: Secondary | ICD-10-CM | POA: Diagnosis not present

## 2017-03-13 DIAGNOSIS — Z85828 Personal history of other malignant neoplasm of skin: Secondary | ICD-10-CM | POA: Diagnosis not present

## 2017-03-13 DIAGNOSIS — F340 Cyclothymic disorder: Secondary | ICD-10-CM | POA: Diagnosis not present

## 2017-03-13 DIAGNOSIS — M6281 Muscle weakness (generalized): Secondary | ICD-10-CM | POA: Diagnosis not present

## 2017-03-13 DIAGNOSIS — Z7902 Long term (current) use of antithrombotics/antiplatelets: Secondary | ICD-10-CM | POA: Diagnosis not present

## 2017-03-13 DIAGNOSIS — Z9181 History of falling: Secondary | ICD-10-CM | POA: Diagnosis not present

## 2017-03-13 DIAGNOSIS — I10 Essential (primary) hypertension: Secondary | ICD-10-CM | POA: Diagnosis not present

## 2017-03-16 DIAGNOSIS — M1991 Primary osteoarthritis, unspecified site: Secondary | ICD-10-CM | POA: Diagnosis not present

## 2017-03-16 DIAGNOSIS — F340 Cyclothymic disorder: Secondary | ICD-10-CM | POA: Diagnosis not present

## 2017-03-16 DIAGNOSIS — I69398 Other sequelae of cerebral infarction: Secondary | ICD-10-CM | POA: Diagnosis not present

## 2017-03-16 DIAGNOSIS — M6281 Muscle weakness (generalized): Secondary | ICD-10-CM | POA: Diagnosis not present

## 2017-03-16 DIAGNOSIS — R413 Other amnesia: Secondary | ICD-10-CM | POA: Diagnosis not present

## 2017-03-16 DIAGNOSIS — E119 Type 2 diabetes mellitus without complications: Secondary | ICD-10-CM | POA: Diagnosis not present

## 2017-03-22 ENCOUNTER — Ambulatory Visit (INDEPENDENT_AMBULATORY_CARE_PROVIDER_SITE_OTHER): Payer: Medicare Other | Admitting: Family Medicine

## 2017-03-22 DIAGNOSIS — I10 Essential (primary) hypertension: Secondary | ICD-10-CM

## 2017-03-22 DIAGNOSIS — E119 Type 2 diabetes mellitus without complications: Secondary | ICD-10-CM

## 2017-03-22 DIAGNOSIS — I69398 Other sequelae of cerebral infarction: Secondary | ICD-10-CM | POA: Diagnosis not present

## 2017-03-22 DIAGNOSIS — I63331 Cerebral infarction due to thrombosis of right posterior cerebral artery: Secondary | ICD-10-CM

## 2017-03-22 DIAGNOSIS — R413 Other amnesia: Secondary | ICD-10-CM | POA: Diagnosis not present

## 2017-03-22 DIAGNOSIS — M6281 Muscle weakness (generalized): Secondary | ICD-10-CM

## 2017-03-22 DIAGNOSIS — F340 Cyclothymic disorder: Secondary | ICD-10-CM

## 2017-03-24 DIAGNOSIS — F340 Cyclothymic disorder: Secondary | ICD-10-CM | POA: Diagnosis not present

## 2017-03-24 DIAGNOSIS — R413 Other amnesia: Secondary | ICD-10-CM | POA: Diagnosis not present

## 2017-03-24 DIAGNOSIS — M1991 Primary osteoarthritis, unspecified site: Secondary | ICD-10-CM | POA: Diagnosis not present

## 2017-03-24 DIAGNOSIS — E119 Type 2 diabetes mellitus without complications: Secondary | ICD-10-CM | POA: Diagnosis not present

## 2017-03-24 DIAGNOSIS — M6281 Muscle weakness (generalized): Secondary | ICD-10-CM | POA: Diagnosis not present

## 2017-03-24 DIAGNOSIS — I69398 Other sequelae of cerebral infarction: Secondary | ICD-10-CM | POA: Diagnosis not present

## 2017-03-25 NOTE — Progress Notes (Signed)
Orders

## 2017-03-31 DIAGNOSIS — M6281 Muscle weakness (generalized): Secondary | ICD-10-CM | POA: Diagnosis not present

## 2017-03-31 DIAGNOSIS — I69398 Other sequelae of cerebral infarction: Secondary | ICD-10-CM | POA: Diagnosis not present

## 2017-03-31 DIAGNOSIS — F340 Cyclothymic disorder: Secondary | ICD-10-CM | POA: Diagnosis not present

## 2017-03-31 DIAGNOSIS — M1991 Primary osteoarthritis, unspecified site: Secondary | ICD-10-CM | POA: Diagnosis not present

## 2017-03-31 DIAGNOSIS — E119 Type 2 diabetes mellitus without complications: Secondary | ICD-10-CM | POA: Diagnosis not present

## 2017-03-31 DIAGNOSIS — R413 Other amnesia: Secondary | ICD-10-CM | POA: Diagnosis not present

## 2017-04-04 ENCOUNTER — Other Ambulatory Visit: Payer: Self-pay | Admitting: Family Medicine

## 2017-04-04 ENCOUNTER — Telehealth: Payer: Self-pay | Admitting: Family Medicine

## 2017-04-04 MED ORDER — GLUCOSE BLOOD VI STRP: ORAL_STRIP | 3 refills | Status: DC

## 2017-04-04 NOTE — Telephone Encounter (Signed)
She is not on insulin-I need to know how often they are checking her blood sugars

## 2017-04-04 NOTE — Telephone Encounter (Signed)
Please review for Dr Rosanna Randy, thank you-aa

## 2017-04-04 NOTE — Telephone Encounter (Signed)
Spoke with Dewaine Oats at [facility] and she states patient checks sugar once a day-

## 2017-04-04 NOTE — Telephone Encounter (Signed)
Faxed to cell fax and to [facility]

## 2017-04-04 NOTE — Telephone Encounter (Signed)
Lori Berger with [name] is requesting a written order for alcohol swabs, One Time Lancets and True Track test strips faxed to [phone].  OE#[phone]/IB

## 2017-04-05 DIAGNOSIS — E119 Type 2 diabetes mellitus without complications: Secondary | ICD-10-CM | POA: Diagnosis not present

## 2017-04-05 DIAGNOSIS — I69398 Other sequelae of cerebral infarction: Secondary | ICD-10-CM | POA: Diagnosis not present

## 2017-04-05 DIAGNOSIS — M1991 Primary osteoarthritis, unspecified site: Secondary | ICD-10-CM | POA: Diagnosis not present

## 2017-04-05 DIAGNOSIS — R413 Other amnesia: Secondary | ICD-10-CM | POA: Diagnosis not present

## 2017-04-05 DIAGNOSIS — M6281 Muscle weakness (generalized): Secondary | ICD-10-CM | POA: Diagnosis not present

## 2017-04-05 DIAGNOSIS — F340 Cyclothymic disorder: Secondary | ICD-10-CM | POA: Diagnosis not present

## 2017-04-12 DIAGNOSIS — M6281 Muscle weakness (generalized): Secondary | ICD-10-CM | POA: Diagnosis not present

## 2017-04-12 DIAGNOSIS — I69398 Other sequelae of cerebral infarction: Secondary | ICD-10-CM | POA: Diagnosis not present

## 2017-04-12 DIAGNOSIS — E119 Type 2 diabetes mellitus without complications: Secondary | ICD-10-CM | POA: Diagnosis not present

## 2017-04-12 DIAGNOSIS — F340 Cyclothymic disorder: Secondary | ICD-10-CM | POA: Diagnosis not present

## 2017-04-12 DIAGNOSIS — M1991 Primary osteoarthritis, unspecified site: Secondary | ICD-10-CM | POA: Diagnosis not present

## 2017-04-12 DIAGNOSIS — R413 Other amnesia: Secondary | ICD-10-CM | POA: Diagnosis not present

## 2017-04-21 DIAGNOSIS — M6281 Muscle weakness (generalized): Secondary | ICD-10-CM | POA: Diagnosis not present

## 2017-04-21 DIAGNOSIS — R413 Other amnesia: Secondary | ICD-10-CM | POA: Diagnosis not present

## 2017-04-21 DIAGNOSIS — M1991 Primary osteoarthritis, unspecified site: Secondary | ICD-10-CM | POA: Diagnosis not present

## 2017-04-21 DIAGNOSIS — E119 Type 2 diabetes mellitus without complications: Secondary | ICD-10-CM | POA: Diagnosis not present

## 2017-04-21 DIAGNOSIS — I69398 Other sequelae of cerebral infarction: Secondary | ICD-10-CM | POA: Diagnosis not present

## 2017-04-21 DIAGNOSIS — F340 Cyclothymic disorder: Secondary | ICD-10-CM | POA: Diagnosis not present

## 2017-04-28 DIAGNOSIS — R413 Other amnesia: Secondary | ICD-10-CM | POA: Diagnosis not present

## 2017-04-28 DIAGNOSIS — M1991 Primary osteoarthritis, unspecified site: Secondary | ICD-10-CM | POA: Diagnosis not present

## 2017-04-28 DIAGNOSIS — F340 Cyclothymic disorder: Secondary | ICD-10-CM | POA: Diagnosis not present

## 2017-04-28 DIAGNOSIS — M6281 Muscle weakness (generalized): Secondary | ICD-10-CM | POA: Diagnosis not present

## 2017-04-28 DIAGNOSIS — E119 Type 2 diabetes mellitus without complications: Secondary | ICD-10-CM | POA: Diagnosis not present

## 2017-04-28 DIAGNOSIS — I69398 Other sequelae of cerebral infarction: Secondary | ICD-10-CM | POA: Diagnosis not present

## 2017-05-02 ENCOUNTER — Encounter: Payer: Self-pay | Admitting: Physician Assistant

## 2017-05-02 ENCOUNTER — Ambulatory Visit (INDEPENDENT_AMBULATORY_CARE_PROVIDER_SITE_OTHER): Payer: Medicare Other | Admitting: Physician Assistant

## 2017-05-02 VITALS — BP 136/84 | HR 88 | Resp 16 | Wt 185.0 lb

## 2017-05-02 DIAGNOSIS — N3090 Cystitis, unspecified without hematuria: Secondary | ICD-10-CM

## 2017-05-02 DIAGNOSIS — E119 Type 2 diabetes mellitus without complications: Secondary | ICD-10-CM

## 2017-05-02 DIAGNOSIS — R300 Dysuria: Secondary | ICD-10-CM | POA: Diagnosis not present

## 2017-05-02 DIAGNOSIS — R350 Frequency of micturition: Secondary | ICD-10-CM

## 2017-05-02 LAB — POCT URINALYSIS DIPSTICK
Bilirubin, UA: NEGATIVE
Glucose, UA: 1000
Ketones, UA: NEGATIVE
Spec Grav, UA: 1.02 (ref 1.010–1.025)
Urobilinogen, UA: 0.2 E.U./dL
pH, UA: 5 (ref 5.0–8.0)

## 2017-05-02 MED ORDER — SULFAMETHOXAZOLE-TRIMETHOPRIM 800-160 MG PO TABS: 1.0000 | ORAL_TABLET | Freq: Two times a day (BID) | ORAL | 0 refills | Status: AC

## 2017-05-02 NOTE — Patient Instructions (Signed)

## 2017-05-02 NOTE — Progress Notes (Signed)
[place]  Chief Complaint  Patient presents with  . Urinary Tract Infection    Started over the weekend    Subjective:    Patient ID: Lori Berger, female    DOB: September 03, 1941, 76 y.o.   MRN: [id number]   Urinary Tract Infection: Patient is a 76 y/o woman with history of DM II on Invokamet who complains of burning with urination, dysuria, frequency and incontinence She has had symptoms for several days. Patient also complains of none. Patient denies fever and vaginal discharge. Patient does not have a history of recurrent UTI.  Patient does not have a history of pyelonephritis or other renal issues. Patient denies vaginal discharge and denies new sexual partners. The patient denies recent travel outside of the [place]. She denies fevers, chills, flank pain, N/V.  Review of Systems  Constitutional: Positive for malaise/fatigue. Negative for chills, diaphoresis, fever and weight loss.  Gastrointestinal: Negative for abdominal pain, blood in stool, constipation, diarrhea, heartburn, melena, nausea and vomiting.  Genitourinary: Positive for dysuria, flank pain, frequency and urgency. Negative for hematuria.  Neurological: Negative for dizziness, weakness and headaches.       Objective:   BP 136/84 (BP Location: Left Arm, Patient Position: Sitting, Cuff Size: Large)   Pulse 88   Resp 16   Wt 185 lb (83.9 kg)   SpO2 98%   BMI 31.76 kg/m   Patient Active Problem List   Diagnosis Date Noted  . Stroke ([place]) 01/02/2017  . Acute lower UTI 01/02/2017  . Stroke (cerebrum) ([place]) 01/02/2017  . Personal history of malignant neoplasm of breast 06/29/2016  . Malignant neoplasm of right female breast ([place]) 03/30/2016  . Loss of weight 03/30/2016  . Faintness 01/26/2016  . Osteopenia 01/26/2016  . Breast cancer, female, right 09/26/2015  . Basal cell carcinoma of face 02/07/2015  . Diabetes mellitus, type 2 ([place]) 02/07/2015  . Dysfunction of  eustachian tube 02/07/2015  . 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 Malignant neoplasm of corpus uteri, except isthmus ([place]) 02/07/2015  . Polyp of corpus uteri 02/07/2015  . Essential (primary) hypertension 02/07/2015  . Need for prophylactic hormone replacement therapy (postmenopausal) 02/07/2015  . Decreased potassium in the blood 02/07/2015  . 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 Malaise and fatigue 02/07/2015  . Combined fat and carbohydrate induced hyperlipemia 02/07/2015  . Adiposity 02/07/2015  . Arthritis, degenerative 02/07/2015  . Allergic rhinitis 02/07/2015  . Postmenopausal bleeding 02/07/2015  . Psychosis 02/07/2015  . Inflammation of sacroiliac joint ([place]) 02/07/2015    Outpatient Encounter Prescriptions as of 05/02/2017  Medication Sig  . amLODipine (NORVASC) 5 MG tablet TAKE 1 TABLET BY MOUTH ONCE A DAY  . aspirin (ASPIRIN CHILDRENS) 81 MG chewable tablet Chew 1 tablet (81 mg total) by mouth daily.  . Calcium Carb-Cholecalciferol (CALCIUM 600+D) 600-800 MG-UNIT TABS Take 1 tablet by mouth daily.  . clopidogrel (PLAVIX) 75 MG tablet Take 1 tablet (75 mg total) by mouth daily.  Marland Kitchen glimepiride (AMARYL) 4 MG tablet TAKE 1 TABLET BY MOUTH ONCE A DAY  . glucose blood test strip Check sugar daily  . ibuprofen (ADVIL) 200 MG tablet Take 1 tablet (200 mg total) by mouth every 6 (six) hours as needed for moderate pain.  . INVOKAMET 150-500 MG TABS TAKE 1 TABLET BY MOUTH TWICE A DAY  . letrozole (FEMARA) 2.5 MG tablet Take 1 tablet (2.5 mg total) by mouth daily.  Marland Kitchen lisinopril (PRINIVIL,ZESTRIL) 20 MG tablet TAKE 1  TABLET BY MOUTH ONCE A DAY  . nystatin (MYCOSTATIN/NYSTOP) powder Apply topically 3 (three) times daily.  . simvastatin (ZOCOR) 10 MG tablet Take 1 tablet (10 mg total) by mouth at bedtime.  . sulfamethoxazole-trimethoprim (BACTRIM DS,SEPTRA DS) 800-160 MG tablet Take 1 tablet by mouth 2 (two) times daily.   No facility-administered encounter  medications on file as of 05/02/2017.     Allergies  Allergen Reactions  . Penicillins        Physical Exam  Constitutional: She is oriented to person, place, and time. She appears well-developed and well-nourished. No distress.  Cardiovascular: Normal rate and regular rhythm.   Pulmonary/Chest: Effort normal and breath sounds normal.  Abdominal: Soft. Bowel sounds are normal. She exhibits no distension. There is tenderness in the suprapubic area. There is no rebound, no guarding and no CVA tenderness.  Neurological: She is alert and oriented to person, place, and time.  Skin: Skin is warm and dry. She is not diaphoretic.  Psychiatric: She has a normal mood and affect. Her behavior is normal.       Assessment & Plan:  1. Cystitis  - sulfamethoxazole-trimethoprim (BACTRIM DS,SEPTRA DS) 800-160 MG tablet; Take 1 tablet by mouth 2 (two) times daily.  Dispense: 14 tablet; Refill: 0 - POCT urinalysis dipstick  2. Dysuria  - CULTURE, URINE COMPREHENSIVE - POCT urinalysis dipstick  3. Urinary frequency  - CULTURE, URINE COMPREHENSIVE - POCT urinalysis dipstick  4. Type 2 diabetes mellitus without complication, unspecified whether long term insulin use ([place])  On [place]ok[place], may need to consider changing if recurrent.     Patient Instructions  Urinary Tract Infection, Adult A urinary tract infection (UTI) is an infection of any part of the urinary tract, which includes the kidneys, ureters, bladder, and urethra. These organs make, store, and get rid of urine in the body. UTI can be a bladder infection (cystitis) or kidney infection (pyelonephritis). What are the causes? This infection may be caused by fungi, viruses, or bacteria. Bacteria are the most common cause of UTIs. This condition can also be caused by repeated incomplete emptying of the bladder during urination. What increases the risk? This condition is more likely to develop if:  You ignore your need to urinate or  hold urine for long periods of time.  You do not empty your bladder completely during urination.  You wipe back to front after urinating or having a bowel movement, if you are female.  You are uncircumcised, if you are female.  You are constipated.  You have a urinary catheter that stays in place (indwelling).  You have a weak defense (immune) system.  You have a medical condition that affects your bowels, kidneys, or bladder.  You have diabetes.  You take antibiotic medicines frequently or for long periods of time, and the antibiotics no longer work well against certain types of infections (antibiotic resistance).  You take medicines that irritate your urinary tract.  You are exposed to chemicals that irritate your urinary tract.  You are female.  What are the signs or symptoms? Symptoms of this condition include:  Fever.  Frequent urination or passing Ribeiro amounts of urine frequently.  Needing to urinate urgently.  Pain or burning with urination.  Urine that smells bad or unusual.  Cloudy urine.  Pain in the lower abdomen or back.  Trouble urinating.  Blood in the urine.  Vomiting or being less hungry than normal.  Diarrhea or abdominal pain.  Vaginal discharge, if you are female.  How is this diagnosed? This condition is diagnosed with a medical history and physical exam. You will also need to provide a urine sample to test your urine. Other tests may be done, including:  Blood tests.  Sexually transmitted disease (STD) testing.  If you have had more than one UTI, a cystoscopy or imaging studies may be done to determine the cause of the infections. How is this treated? Treatment for this condition often includes a combination of two or more of the following:  Antibiotic medicine.  Other medicines to treat less common causes of UTI.  Over-the-counter medicines to treat pain.  Drinking enough water to stay hydrated.  Follow these instructions at  home:  Take over-the-counter and prescription medicines only as told by your health care provider.  If you were prescribed an antibiotic, take it as told by your health care provider. Do not stop taking the antibiotic even if you start to feel better.  Avoid alcohol, caffeine, tea, and carbonated beverages. They can irritate your bladder.  Drink enough fluid to keep your urine clear or pale yellow.  Keep all follow-up visits as told by your health care provider. This is important.  Make sure to: ? Empty your bladder often and completely. Do not hold urine for long periods of time. ? Empty your bladder before and after sex. ? Wipe from front to back after a bowel movement if you are female. Use each tissue one time when you wipe. Contact a health care provider if:  You have back pain.  You have a fever.  You feel nauseous or vomit.  Your symptoms do not get better after 3 days.  Your symptoms go away and then return. Get help right away if:  You have severe back pain or lower abdominal pain.  You are vomiting and cannot keep down any medicines or water. This information is not intended to replace advice given to you by your health care provider. Make sure you discuss any questions you have with your health care provider. Document Released: 06/30/2005 Document Revised: 03/03/2016 Document Reviewed: 08/11/2015 Elsevier Interactive Patient Education  2017 [employer].    Return if symptoms worsen or fail to improve.  The entirety of the information documented in the History of Present Illness, Review of Systems and Physical Exam were personally obtained by me. Portions of this information were initially documented by Ashley Royalty, CMA and reviewed by me for thoroughness and accuracy.

## 2017-05-03 ENCOUNTER — Telehealth: Payer: Self-pay | Admitting: Family Medicine

## 2017-05-03 NOTE — Telephone Encounter (Signed)
Ok to send in that order.

## 2017-05-03 NOTE — Telephone Encounter (Signed)
Pt's daughter in law called saying pt is going to need a tooth pulled but her dentist Dr. Carman Ching wants her to be off her plavix for 3 to 4 days prior to the extraction.  They need to know if that will be ok and if so Dr. Rosanna Randy will need to write an order.  She is at [facility]  Daughters call back is [phone]  Thanks Con Memos

## 2017-05-03 NOTE — Telephone Encounter (Signed)
Please review-aa 

## 2017-05-04 NOTE — Telephone Encounter (Signed)
Contacted Mariann Laster (pt's daughter in Sports coach) to get fax number for [facility] [employer]. Their fax number is [phone]. Mariann Laster requested that we call Dr. Marilu Favre office to schedule tooth extraction so that the order faxed to [facility] will say exactly when to D/C Plavix. Contacted Dr. Marilu Favre office at [phone] and they were closed for the day. Mariann Laster advised that they were closed already.   Dr. Marilu Favre office needs to be contacted on Thursday so extraction can be scheduled. After being scheduled order needs to be faxed to [facility] at [phone]. Mariann Laster also needs to be advised of appointment date nad time. She did not have a preference on specific dates or times to schedule.

## 2017-05-05 ENCOUNTER — Telehealth: Payer: Self-pay

## 2017-05-05 LAB — CULTURE, URINE COMPREHENSIVE

## 2017-05-05 NOTE — Telephone Encounter (Signed)
Called Dr Waynetta Sandy office and appointment made for extraction on 05/11/17 at 1:30 pm, Mariann Laster advised and order faxed to [facility] to stop Plavix on 05/07/17-aa

## 2017-05-05 NOTE — Telephone Encounter (Signed)
Mariann Laster, pt's daughter-in-law was advised of results. Lori Berger is feeling better. Advised to finish abx and to call if sx worsen.

## 2017-05-05 NOTE — Telephone Encounter (Signed)
-----   Message from Trinna Post, [place] sent at 05/05/2017 11:43 AM EDT ----- Urine culture grew E. Coli sensitive to Bactrim. How is she feeling?

## 2017-05-06 DIAGNOSIS — I69398 Other sequelae of cerebral infarction: Secondary | ICD-10-CM | POA: Diagnosis not present

## 2017-05-06 DIAGNOSIS — E119 Type 2 diabetes mellitus without complications: Secondary | ICD-10-CM | POA: Diagnosis not present

## 2017-05-06 DIAGNOSIS — F340 Cyclothymic disorder: Secondary | ICD-10-CM | POA: Diagnosis not present

## 2017-05-06 DIAGNOSIS — R413 Other amnesia: Secondary | ICD-10-CM | POA: Diagnosis not present

## 2017-05-06 DIAGNOSIS — M6281 Muscle weakness (generalized): Secondary | ICD-10-CM | POA: Diagnosis not present

## 2017-05-06 DIAGNOSIS — M1991 Primary osteoarthritis, unspecified site: Secondary | ICD-10-CM | POA: Diagnosis not present

## 2017-05-06 NOTE — Telephone Encounter (Signed)
Noted, thank you

## 2017-05-11 DIAGNOSIS — R413 Other amnesia: Secondary | ICD-10-CM | POA: Diagnosis not present

## 2017-05-11 DIAGNOSIS — I69398 Other sequelae of cerebral infarction: Secondary | ICD-10-CM | POA: Diagnosis not present

## 2017-05-11 DIAGNOSIS — F340 Cyclothymic disorder: Secondary | ICD-10-CM | POA: Diagnosis not present

## 2017-05-11 DIAGNOSIS — M6281 Muscle weakness (generalized): Secondary | ICD-10-CM | POA: Diagnosis not present

## 2017-05-11 DIAGNOSIS — M1991 Primary osteoarthritis, unspecified site: Secondary | ICD-10-CM | POA: Diagnosis not present

## 2017-05-11 DIAGNOSIS — E119 Type 2 diabetes mellitus without complications: Secondary | ICD-10-CM | POA: Diagnosis not present

## 2017-06-02 ENCOUNTER — Ambulatory Visit (INDEPENDENT_AMBULATORY_CARE_PROVIDER_SITE_OTHER): Payer: Medicare Other | Admitting: Family Medicine

## 2017-06-02 ENCOUNTER — Encounter: Payer: Self-pay | Admitting: Family Medicine

## 2017-06-02 VITALS — BP 108/56 | HR 82 | Temp 97.9°F | Resp 16 | Wt 186.0 lb

## 2017-06-02 DIAGNOSIS — E119 Type 2 diabetes mellitus without complications: Secondary | ICD-10-CM | POA: Diagnosis not present

## 2017-06-02 DIAGNOSIS — I639 Cerebral infarction, unspecified: Secondary | ICD-10-CM

## 2017-06-02 LAB — POCT GLYCOSYLATED HEMOGLOBIN (HGB A1C): HEMOGLOBIN A1C: 6.4

## 2017-06-02 NOTE — Progress Notes (Signed)
Patient: Lori Berger Female    DOB: 01-19-1941   76 y.o.   MRN: [id number] Visit Date: 06/02/2017  Today's Provider: Wilhemena Durie, MD   Chief Complaint  Patient presents with  . Diabetes   Subjective:    HPI  Diabetes Mellitus Type II, Follow-up:    Lab Results  Component Value Date   HGBA1C 6.4 06/02/2017   HGBA1C 9.2 (H) 01/02/2017   HGBA1C 9.2 (H) 10/19/2016    Last seen for diabetes 3 months ago.  Management since then includes none. She reports good compliance with treatment. She is not having side effects.  Home blood sugar records: 85-100's. This morning it was 100 before breakfast.   Episodes of hypoglycemia? no   Most Recent Eye Exam: spring 2017 Pt is due for a diabetic foot exam.   Pertinent Labs:    Component Value Date/Time   CHOL 163 01/02/2017 1826   CHOL 249 (H) 10/19/2016 0955   TRIG 271 (H) 01/02/2017 1826   HDL 49 01/02/2017 1826   HDL 40 10/19/2016 0955   LDLCALC 60 01/02/2017 1826   LDLCALC 136 (H) 10/19/2016 0955   CREATININE 0.86 01/05/2017 0347    Wt Readings from Last 3 Encounters:  06/02/17 186 lb (84.4 kg)  05/02/17 185 lb (83.9 kg)  02/10/17 187 lb (84.8 kg)    ------------------------------------------------------------------------        Allergies  Allergen Reactions  . Penicillins      Current Outpatient Prescriptions:  .  amLODipine (NORVASC) 5 MG tablet, TAKE 1 TABLET BY MOUTH ONCE A DAY, Disp: 30 tablet, Rfl: 12 .  aspirin (ASPIRIN CHILDRENS) 81 MG chewable tablet, Chew 1 tablet (81 mg total) by mouth daily., Disp: 120 tablet, Rfl: 0 .  Calcium Carb-Cholecalciferol (CALCIUM 600+D) 600-800 MG-UNIT TABS, Take 1 tablet by mouth daily., Disp: , Rfl:  .  clopidogrel (PLAVIX) 75 MG tablet, Take 1 tablet (75 mg total) by mouth daily., Disp: 30 tablet, Rfl: 0 .  glimepiride (AMARYL) 4 MG tablet, TAKE 1 TABLET BY MOUTH ONCE A DAY, Disp: 30 tablet, Rfl: 12 .  glucose blood test strip, Check sugar daily,  Disp: 100 each, Rfl: 3 .  ibuprofen (ADVIL) 200 MG tablet, Take 1 tablet (200 mg total) by mouth every 6 (six) hours as needed for moderate pain., Disp: 30 tablet, Rfl: 0 .  INVOKAMET 150-500 MG TABS, TAKE 1 TABLET BY MOUTH TWICE A DAY, Disp: 60 tablet, Rfl: 11 .  letrozole (FEMARA) 2.5 MG tablet, Take 1 tablet (2.5 mg total) by mouth daily., Disp: 30 tablet, Rfl: 12 .  lisinopril (PRINIVIL,ZESTRIL) 20 MG tablet, TAKE 1 TABLET BY MOUTH ONCE A DAY, Disp: 30 tablet, Rfl: 12 .  nystatin (MYCOSTATIN/NYSTOP) powder, Apply topically 3 (three) times daily., Disp: 30 g, Rfl: 0 .  simvastatin (ZOCOR) 10 MG tablet, Take 1 tablet (10 mg total) by mouth at bedtime., Disp: 90 tablet, Rfl: 3  Review of Systems  Constitutional: Negative.   HENT: Negative.   Eyes: Negative.   Respiratory: Negative.   Cardiovascular: Negative.   Gastrointestinal: Negative.   Endocrine: Negative.   Genitourinary: Negative.   Musculoskeletal: Negative.   Skin: Negative.   Allergic/Immunologic: Negative.   Neurological: Negative.   Hematological: Negative.   Psychiatric/Behavioral: Negative.     Social History  Substance Use Topics  . Smoking status: Never Smoker  . Smokeless tobacco: Never Used  . Alcohol use No   Objective:   BP (!) 108/56 (  BP Location: Left Arm, Patient Position: Sitting, Cuff Size: Large)   Pulse 82   Temp 97.9 F (36.6 C) (Oral)   Resp 16   Wt 186 lb (84.4 kg)   SpO2 99%   BMI 31.93 kg/m    Vitals:   06/02/17 1113  BP: (!) 108/56  Pulse: 82  Resp: 16  Temp: 97.9 F (36.6 C)  TempSrc: Oral  SpO2: 99%  Weight: 186 lb (84.4 kg)     Physical Exam  Constitutional: She is oriented to person, place, and time. She appears well-developed and well-nourished.  HENT:  Head: Normocephalic and atraumatic.  Right Ear: External ear normal.  Left Ear: External ear normal.  Nose: Nose normal.  Eyes: Pupils are equal, round, and reactive to light. Conjunctivae and EOM are normal.  Neck:  Normal range of motion. Neck supple.  Cardiovascular: Normal rate, regular rhythm, normal heart sounds and intact distal pulses.   Pulmonary/Chest: Effort normal and breath sounds normal.  Abdominal: Soft.  Musculoskeletal: Normal range of motion.  Neurological: She is alert and oriented to person, place, and time. She has normal reflexes.  Skin: Skin is warm and dry.  Psychiatric: She has a normal mood and affect. Her behavior is normal. Judgment and thought content normal.   Diabetic Foot Exam - Simple   Simple Foot Form Visual Inspection No deformities, no ulcerations, no other skin breakdown bilaterally:  Yes Sensation Testing Intact to touch and monofilament testing bilaterally:  Yes Pulse Check Posterior Tibialis and Dorsalis pulse intact bilaterally:  Yes Comments distally she has decreased sensation.         Assessment & Plan:     1. Type 2 diabetes mellitus without complication, unspecified whether long term insulin use (HCC) 6.4 today. Pt is doing well at [employer]. Follow up in 3 months.  - POCT HgB A1C--was 9.4  2.s/p CVA 3.h/o Breast Cancer     HPI, Exam, and A&P Transcribed under the direction and in the presence of Recia Sons L. Cranford Mon, MD  Electronically Signed: Katina Dung, CMA  I have done the exam and reviewed the above chart and it is accurate to the best of my knowledge. Development worker, community has been used in this note in any air is in the dictation or transcription are unintentional.  Wilhemena Durie, MD  [place]

## 2017-06-15 DIAGNOSIS — L570 Actinic keratosis: Secondary | ICD-10-CM | POA: Diagnosis not present

## 2017-06-15 DIAGNOSIS — L578 Other skin changes due to chronic exposure to nonionizing radiation: Secondary | ICD-10-CM | POA: Diagnosis not present

## 2017-06-15 DIAGNOSIS — L219 Seborrheic dermatitis, unspecified: Secondary | ICD-10-CM | POA: Diagnosis not present

## 2017-07-15 DIAGNOSIS — Z23 Encounter for immunization: Secondary | ICD-10-CM | POA: Diagnosis not present

## 2017-08-15 ENCOUNTER — Ambulatory Visit (INDEPENDENT_AMBULATORY_CARE_PROVIDER_SITE_OTHER): Payer: Medicare Other | Admitting: Physician Assistant

## 2017-08-15 ENCOUNTER — Encounter: Payer: Self-pay | Admitting: Physician Assistant

## 2017-08-15 VITALS — BP 114/72 | HR 80 | Temp 98.6°F | Resp 16 | Wt 185.0 lb

## 2017-08-15 DIAGNOSIS — E119 Type 2 diabetes mellitus without complications: Secondary | ICD-10-CM

## 2017-08-15 DIAGNOSIS — J029 Acute pharyngitis, unspecified: Secondary | ICD-10-CM

## 2017-08-15 DIAGNOSIS — I10 Essential (primary) hypertension: Secondary | ICD-10-CM

## 2017-08-15 DIAGNOSIS — R6883 Chills (without fever): Secondary | ICD-10-CM | POA: Diagnosis not present

## 2017-08-15 NOTE — Progress Notes (Signed)
Patient: Lori Berger Female    DOB: 17-Jun-1941   76 y.o.   MRN: [id number] Visit Date: 08/15/2017  Today's Provider: Trinna Post, PA-C   Chief Complaint  Patient presents with  . Sore Throat    Started last night   Subjective:    Lori Berger is a 76 y/o woman with history of stroke and cognitive impairment as well as pneumonia presenting today with sore throat. One day of sore throat, fatigue, possibly low grade fever. No productive cough, shortness of breath, chest pain. No nausea or vomiting. (+) reduced appetite. Sick contacts include residents at [facility].   Sore Throat   This is a new problem. The current episode started yesterday. The problem has been gradually worsening. Neither side of throat is experiencing more pain than the other. The pain is at a severity of 4/10. The pain is moderate. Associated symptoms include congestion and coughing. Pertinent negatives include no abdominal pain, diarrhea, drooling, ear discharge, ear pain, headaches, hoarse voice, shortness of breath or stridor.       Allergies  Allergen Reactions  . Penicillins      Current Outpatient Medications:  .  amLODipine (NORVASC) 5 MG tablet, TAKE 1 TABLET BY MOUTH ONCE A DAY, Disp: 30 tablet, Rfl: 12 .  aspirin (ASPIRIN CHILDRENS) 81 MG chewable tablet, Chew 1 tablet (81 mg total) by mouth daily., Disp: 120 tablet, Rfl: 0 .  Calcium Carb-Cholecalciferol (CALCIUM 600+D) 600-800 MG-UNIT TABS, Take 1 tablet by mouth daily., Disp: , Rfl:  .  clopidogrel (PLAVIX) 75 MG tablet, Take 1 tablet (75 mg total) by mouth daily., Disp: 30 tablet, Rfl: 0 .  glimepiride (AMARYL) 4 MG tablet, TAKE 1 TABLET BY MOUTH ONCE A DAY, Disp: 30 tablet, Rfl: 12 .  glucose blood test strip, Check sugar daily, Disp: 100 each, Rfl: 3 .  ibuprofen (ADVIL) 200 MG tablet, Take 1 tablet (200 mg total) by mouth every 6 (six) hours as needed for moderate pain., Disp: 30 tablet, Rfl: 0 .  INVOKAMET 150-500 MG TABS, TAKE  1 TABLET BY MOUTH TWICE A DAY, Disp: 60 tablet, Rfl: 11 .  letrozole (FEMARA) 2.5 MG tablet, Take 1 tablet (2.5 mg total) by mouth daily., Disp: 30 tablet, Rfl: 12 .  lisinopril (PRINIVIL,ZESTRIL) 20 MG tablet, TAKE 1 TABLET BY MOUTH ONCE A DAY, Disp: 30 tablet, Rfl: 12 .  nystatin (MYCOSTATIN/NYSTOP) powder, Apply topically 3 (three) times daily., Disp: 30 g, Rfl: 0 .  simvastatin (ZOCOR) 10 MG tablet, Take 1 tablet (10 mg total) by mouth at bedtime., Disp: 90 tablet, Rfl: 3  Review of Systems  Constitutional: Positive for chills and fatigue. Negative for activity change, appetite change, diaphoresis, fever and unexpected weight change.  HENT: Positive for congestion, rhinorrhea, sinus pressure, sinus pain, sneezing and sore throat. Negative for drooling, ear discharge, ear pain, hearing loss, hoarse voice, nosebleeds, postnasal drip and tinnitus.   Eyes: Negative.   Respiratory: Positive for cough. Negative for apnea, choking, chest tightness, shortness of breath, wheezing and stridor.   Gastrointestinal: Negative.  Negative for abdominal pain and diarrhea.  Neurological: Negative for dizziness, light-headedness and headaches.    Social History   Tobacco Use  . Smoking status: Never Smoker  . Smokeless tobacco: Never Used  Substance Use Topics  . Alcohol use: No   Objective:   BP 114/72 (BP Location: Left Arm, Patient Position: Sitting, Cuff Size: Large)   Pulse 80   Temp 98.6  F (37 C) (Oral)   Resp 16   Wt 185 lb (83.9 kg)   BMI 31.76 kg/m  Vitals:   08/15/17 1547  BP: 114/72  Pulse: 80  Resp: 16  Temp: 98.6 F (37 C)  TempSrc: Oral  Weight: 185 lb (83.9 kg)     Physical Exam  Constitutional: She is oriented to person, place, and time. She appears well-developed and well-nourished.  HENT:  Right Ear: External ear normal.  Left Ear: External ear normal.  Mouth/Throat: Posterior oropharyngeal edema and posterior oropharyngeal erythema present. No oropharyngeal  exudate.  Neck: Neck supple.  Cardiovascular: Normal rate and regular rhythm.  Pulmonary/Chest: Effort normal and breath sounds normal. No respiratory distress. She has no wheezes. She has no rales.  Lymphadenopathy:    She has cervical adenopathy.  Neurological: She is alert and oriented to person, place, and time.  Skin: Skin is warm and dry.  Psychiatric: She has a normal mood and affect. Her behavior is normal.        Assessment & Plan:     1. Sore throat  Do think this is viral, rapid strep negative. Patient is very fearful of contracting pneumonia again. At request, provided written prescription of doxycycline 100 mg BID x 7 days with copy for [facility].   - POCT rapid strep A  2. Chills   3. Essential (primary) hypertension  Stable.  4. Type 2 diabetes mellitus without complication, unspecified whether long term insulin use (HCC)  Stable.  Return if symptoms worsen or fail to improve.  The entirety of the information documented in the History of Present Illness, Review of Systems and Physical Exam were personally obtained by me. Portions of this information were initially documented by Ashley Royalty, CMA and reviewed by me for thoroughness and accuracy.           Trinna Post, PA-C  [facility]

## 2017-08-15 NOTE — Patient Instructions (Signed)

## 2017-08-16 LAB — POCT RAPID STREP A (OFFICE): Rapid Strep A Screen: NEGATIVE

## 2017-09-05 ENCOUNTER — Ambulatory Visit: Payer: Self-pay | Admitting: Family Medicine

## 2017-09-07 DIAGNOSIS — E119 Type 2 diabetes mellitus without complications: Secondary | ICD-10-CM | POA: Diagnosis not present

## 2017-09-07 LAB — HM DIABETES EYE EXAM

## 2017-09-09 ENCOUNTER — Encounter: Payer: Self-pay | Admitting: Ophthalmology

## 2017-09-13 ENCOUNTER — Ambulatory Visit (INDEPENDENT_AMBULATORY_CARE_PROVIDER_SITE_OTHER): Payer: Medicare Other | Admitting: Family Medicine

## 2017-09-13 VITALS — BP 122/64 | HR 100 | Temp 98.6°F | Resp 16 | Wt 188.7 lb

## 2017-09-13 DIAGNOSIS — I10 Essential (primary) hypertension: Secondary | ICD-10-CM | POA: Diagnosis not present

## 2017-09-13 DIAGNOSIS — Z8673 Personal history of transient ischemic attack (TIA), and cerebral infarction without residual deficits: Secondary | ICD-10-CM

## 2017-09-13 DIAGNOSIS — I639 Cerebral infarction, unspecified: Secondary | ICD-10-CM

## 2017-09-13 DIAGNOSIS — E7849 Other hyperlipidemia: Secondary | ICD-10-CM | POA: Diagnosis not present

## 2017-09-13 DIAGNOSIS — E119 Type 2 diabetes mellitus without complications: Secondary | ICD-10-CM | POA: Diagnosis not present

## 2017-09-13 LAB — POCT GLYCOSYLATED HEMOGLOBIN (HGB A1C): Hemoglobin A1C: 6.1

## 2017-09-13 NOTE — Patient Instructions (Addendum)
Per Dr Rosanna Randy stop Glimepiride. Continue Invokamet. Continue all other medications and supplements as it has been besides the change mentioned above.

## 2017-09-13 NOTE — Progress Notes (Signed)
Lori Berger  MRN: [id number] DOB: [birth year]  Subjective:  HPI  Patient is here for follow up. Last routine office visit was on 06/02/17. DM: patient states her sugar gets checked every morning at [facility]. Sometimes it gets low around 60 or 65 but patient states she does not feel any different at that time she is just told to eat extra breakfast on mornings like this. No numbness or tingling sensation present. Lab Results  Component Value Date   HGBA1C 6.4 06/02/2017   Wt Readings from Last 3 Encounters:  09/13/17 188 lb 10.4 oz (85.6 kg)  08/15/17 185 lb (83.9 kg)  06/02/17 186 lb (84.4 kg)   Last routine lab work was done on 01/02/17. Patient Active Problem List   Diagnosis Date Noted  . Stroke ([place]) 01/02/2017  . Acute lower UTI 01/02/2017  . Stroke (cerebrum) ([place]) 01/02/2017  . Personal history of malignant neoplasm of breast 06/29/2016  . Malignant neoplasm of right female breast ([place]) 03/30/2016  . Loss of weight 03/30/2016  . Faintness 01/26/2016  . Osteopenia 01/26/2016  . Breast cancer, female, right 09/26/2015  . Basal cell carcinoma of face 02/07/2015  . Diabetes mellitus, type 2 ([place]) 02/07/2015  . Dysfunction of eustachian tube 02/07/2015  . 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 Malignant neoplasm of corpus uteri, except isthmus ([place]) 02/07/2015  . Polyp of corpus uteri 02/07/2015  . Essential (primary) hypertension 02/07/2015  . Need for prophylactic hormone replacement therapy (postmenopausal) 02/07/2015  . Decreased potassium in the blood 02/07/2015  . 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 Malaise and fatigue 02/07/2015  . Combined fat and carbohydrate induced hyperlipemia 02/07/2015  . Adiposity 02/07/2015  . Arthritis, degenerative 02/07/2015  . Allergic rhinitis 02/07/2015  . Postmenopausal bleeding 02/07/2015  . Psychosis ([place]) 02/07/2015  . Inflammation of sacroiliac joint ([place]) 02/07/2015    Past Medical History:    Diagnosis Date  . Arthritis   . BCC (basal cell carcinoma)   . Breast cancer ([place]) 1990   left 1990  . Breast cancer ([place]) 11/11/2015   Right, T2 (2.4 cm(, N0; positive deep margin, ER: 100%, PR: 60%; Her 2 neu not amplified, Mammoprint: low risk  . Diabetes mellitus without complication ([place])   . Endometrial cancer ([place])   . Hyperlipidemia   . Hypertension   . Major depressive disorder   . PONV (postoperative nausea and vomiting)     Social History   Socioeconomic History  . Marital status: Widowed    Spouse name: widowed  . Number of children: 3  . Years of education: Not on file  . Highest education level: Not on file  Social Needs  . Financial resource strain: Not on file  . Food insecurity - worry: Not on file  . Food insecurity - inability: Not on file  . Transportation needs - medical: Not on file  . Transportation needs - non-medical: Not on file  Occupational History  . Not on file  Tobacco Use  . Smoking status: Never Smoker  . Smokeless tobacco: Never Used  Substance and Sexual Activity  . Alcohol use: No  . Drug use: No  . Sexual activity: No  Other Topics Concern  . Not on file  Social History Narrative  . Not on file    Outpatient Encounter Medications as of 09/13/2017  Medication Sig  . amLODipine (NORVASC) 5 MG tablet TAKE 1 TABLET BY MOUTH ONCE A DAY  . aspirin (ASPIRIN CHILDRENS) 81 MG chewable  tablet Chew 1 tablet (81 mg total) by mouth daily.  . Calcium Carb-Cholecalciferol (CALCIUM 600+D) 600-800 MG-UNIT TABS Take 1 tablet by mouth daily.  . clopidogrel (PLAVIX) 75 MG tablet Take 1 tablet (75 mg total) by mouth daily.  Marland Kitchen glimepiride (AMARYL) 4 MG tablet TAKE 1 TABLET BY MOUTH ONCE A DAY  . glucose blood test strip Check sugar daily  . ibuprofen (ADVIL) 200 MG tablet Take 1 tablet (200 mg total) by mouth every 6 (six) hours as needed for moderate pain.  . INVOKAMET 150-500 MG TABS TAKE 1 TABLET BY MOUTH TWICE A DAY  . letrozole (FEMARA) 2.5  MG tablet Take 1 tablet (2.5 mg total) by mouth daily.  Marland Kitchen lisinopril (PRINIVIL,ZESTRIL) 20 MG tablet TAKE 1 TABLET BY MOUTH ONCE A DAY  . simvastatin (ZOCOR) 10 MG tablet Take 1 tablet (10 mg total) by mouth at bedtime.  . [DISCONTINUED] nystatin (MYCOSTATIN/NYSTOP) powder Apply topically 3 (three) times daily.   No facility-administered encounter medications on file as of 09/13/2017.     Allergies  Allergen Reactions  . Penicillins     Review of Systems  Constitutional: Negative for chills, fever and malaise/fatigue.  Eyes: Negative.   Respiratory: Positive for shortness of breath (sometimes with exertion).   Cardiovascular: Negative.   Gastrointestinal: Negative.   Musculoskeletal: Negative.        Sometimes knees bother patient.  Neurological: Negative for dizziness, tingling, tremors, weakness and headaches.  Endo/Heme/Allergies: Negative.   Psychiatric/Behavioral: Negative.     Objective:  BP 122/64   Pulse 100   Temp 98.6 F (37 C)   Resp 16   Wt 188 lb 10.4 oz (85.6 kg)   BMI 32.38 kg/m   Physical Exam  Constitutional: She is oriented to person, place, and time and well-developed, well-nourished, and in no distress.  HENT:  Head: Normocephalic and atraumatic.  Eyes: Conjunctivae are normal. Pupils are equal, round, and reactive to light.  Neck: Normal range of motion. Neck supple. No thyromegaly present.  Cardiovascular: Normal rate, regular rhythm, normal heart sounds and intact distal pulses. Exam reveals no gallop.  No murmur heard. Pulmonary/Chest: Effort normal and breath sounds normal. No respiratory distress. She has no wheezes.  Musculoskeletal: She exhibits no edema or tenderness.  Neurological: She is alert and oriented to person, place, and time.  Psychiatric: Affect and judgment normal.   Diabetic Foot Exam - Simple   Simple Foot Form Diabetic Foot exam was performed with the following findings:  Yes 09/13/2017  4:06 PM  Visual Inspection No  deformities, no ulcerations, no other skin breakdown bilaterally:  Yes Sensation Testing Intact to touch and monofilament testing bilaterally:  Yes Pulse Check Posterior Tibialis and Dorsalis pulse intact bilaterally:  Yes Comments    Assessment and Plan :  1. Type 2 diabetes mellitus without complication, without long-term current use of insulin (HCC) Better. Due to getting some hypoglycemic episodes advised patient and her daughter to stop Glimepiride and continue Invokamet. Follow up on next visit. Patient is doing well. - POCT HgB A1C-6.1  2. Essential (primary) hypertension Stable. Continue current medication.  3. Other hyperlipidemia 4. Status post CVA 5.h/o Breast Cancer 1990 HPI, Exam and A&P transcribed by Tiffany Kocher, RMA under direction and in the presence of Miguel Aschoff, MD. I have done the exam and reviewed the chart and it is accurate to the best of my knowledge. Development worker, community has been used and  any errors in dictation or transcription are unintentional. Miguel Aschoff  M.D. [facility]

## 2017-10-24 IMAGING — MR MR HEAD W/O CM
8 of 10 series · 32 of 48 positions shown · non-contrast
Comparison: Head CT and CTA from yesterday

CLINICAL DATA: PCA infarct.

EXAM:
MRI HEAD WITHOUT CONTRAST
TECHNIQUE: Multiplanar, multiecho pulse sequences of the brain and surrounding
structures were obtained without intravenous contrast.

[Series 2: T1 · sagittal · 5.0mm · 0.45mm/px · 4 of 29 slices shown (1 of 2)]
[im 1/29]
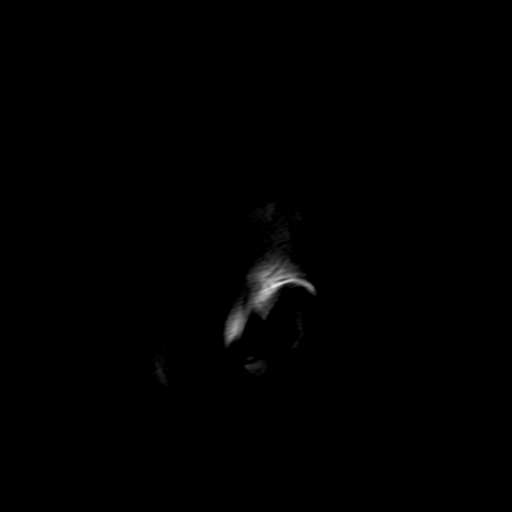
[im 10/29]
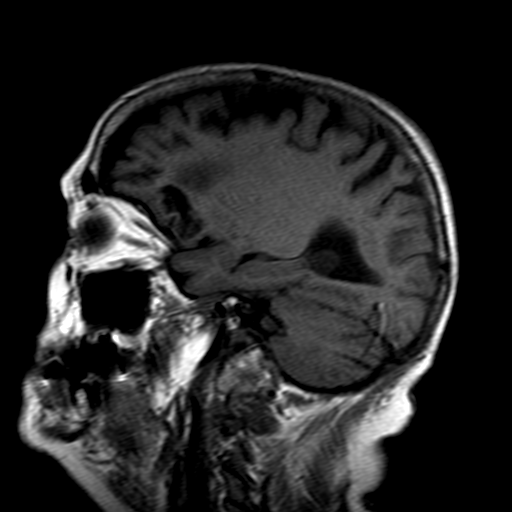
[im 19/29]
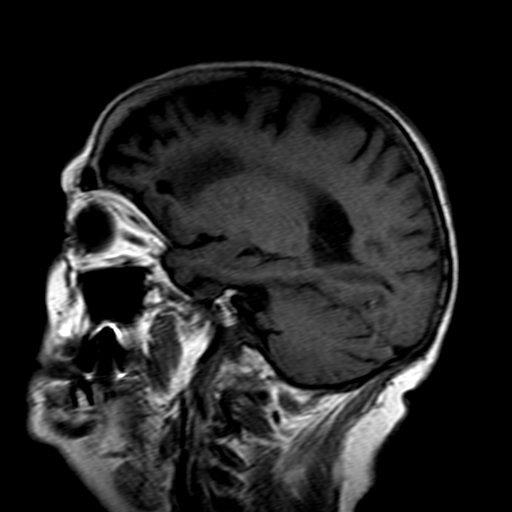
[im 29/29]
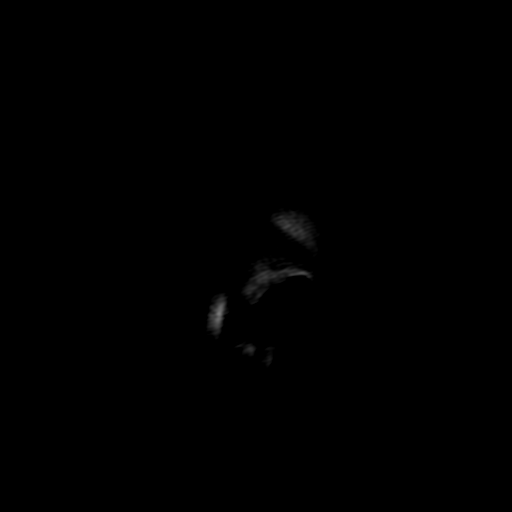

[Series 4: DWI · axial · 4.0mm · 0.94mm/px · z∈[-23,+133]mm · 5 of 42 slices shown (1 of 2)]
[im 1/42]
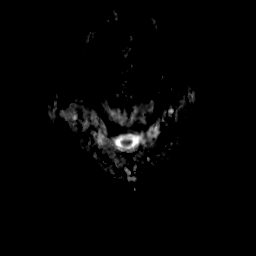
[im 11/42]
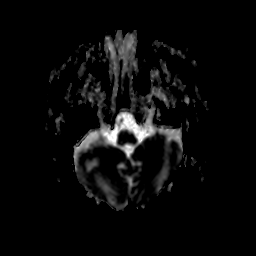
[im 21/42]
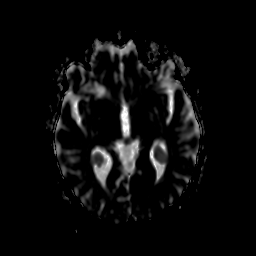
[im 31/42]
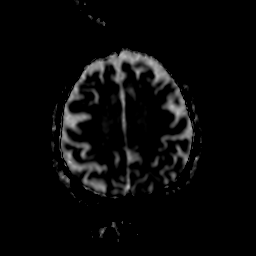
[im 42/42]
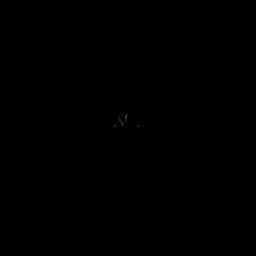

[Series 6: DWI · coronal · 5.0mm · 1.80mm/px · 5 of 39 slices shown (2 of 2)]
[im 1/39]
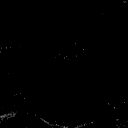
[im 10/39]
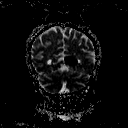
[im 20/39]
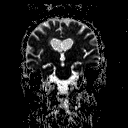
[im 29/39]
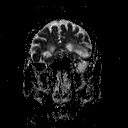
[im 39/39]
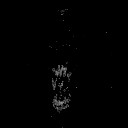

[Series 9: T2 · axial · 5.0mm · 0.45mm/px · z∈[-19,+135]mm · 3 of 26 slices shown (1 of 3)]
[im 1/26]
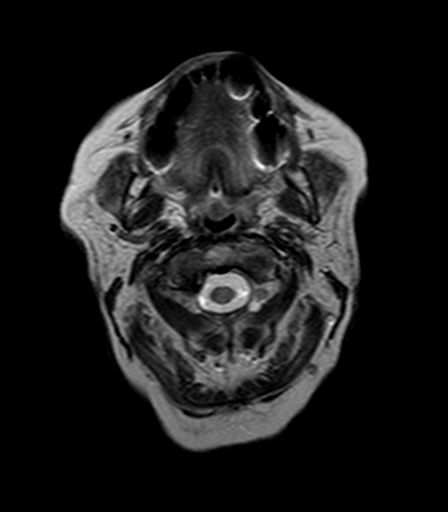
[im 13/26]
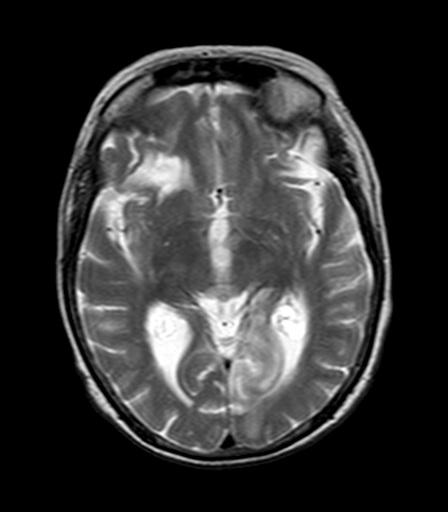
[im 26/26]
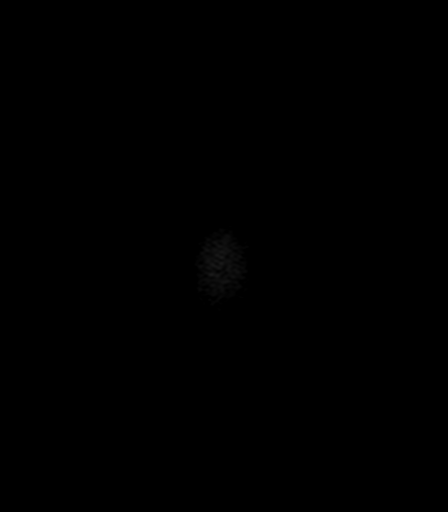

[Series 10: FLAIR · axial · 5.0mm · 0.90mm/px · z∈[-19,+136]mm · 3 of 26 slices shown]
[im 1/26]
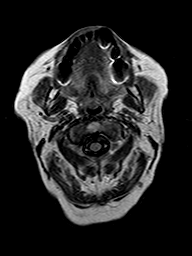
[im 13/26]
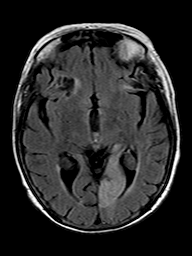
[im 26/26]
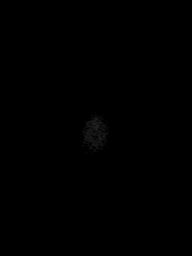

[Series 11: T2 · axial · 5.0mm · 0.45mm/px · z∈[-19,+135]mm · 3 of 26 slices shown (2 of 3)]
[im 1/26]
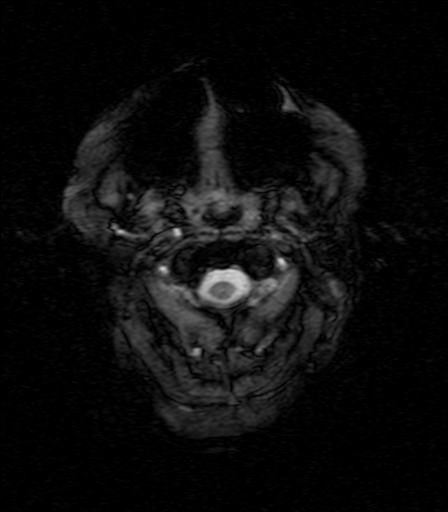
[im 13/26]
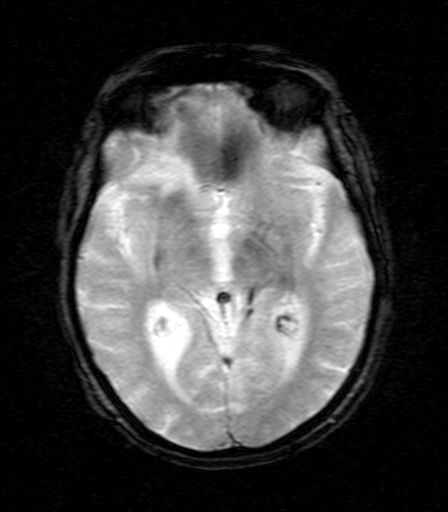
[im 26/26]
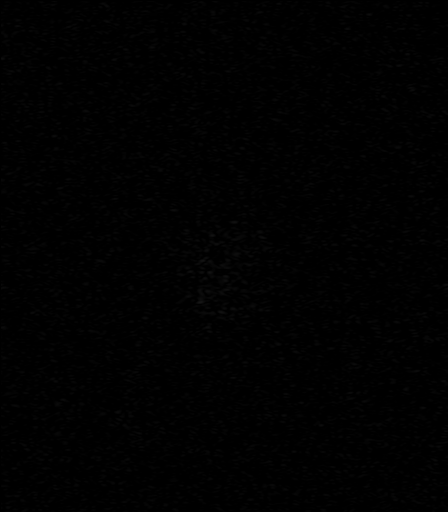

[Series 12: T1 · axial · 3.0mm · 0.45mm/px · z∈[-26,+85]mm · 5 of 60 slices shown (2 of 2)]
[im 1/60]
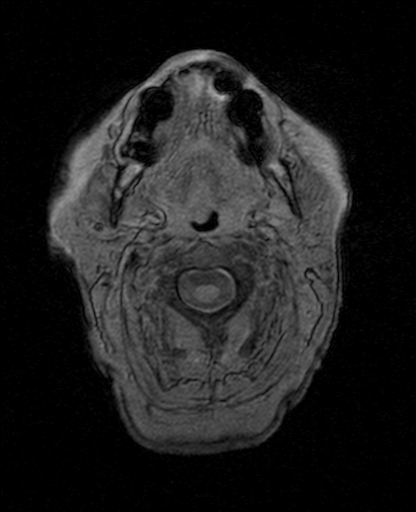
[im 10/60]
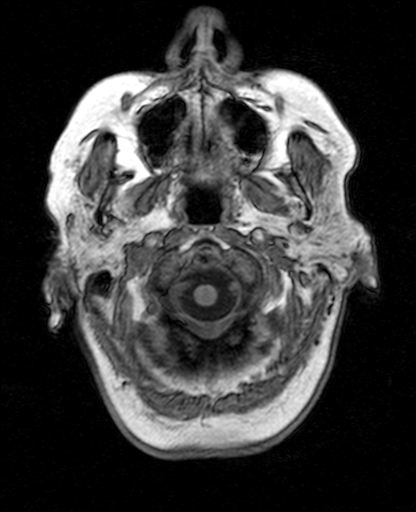
[im 20/60]
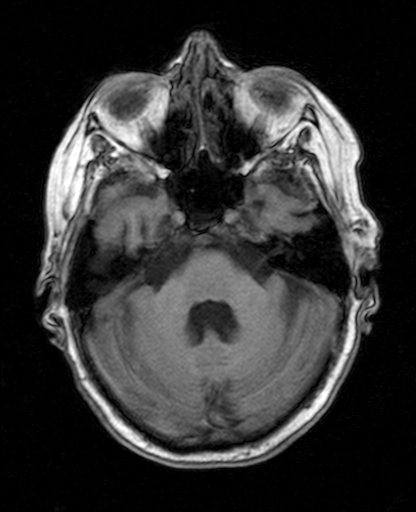
[im 30/60]
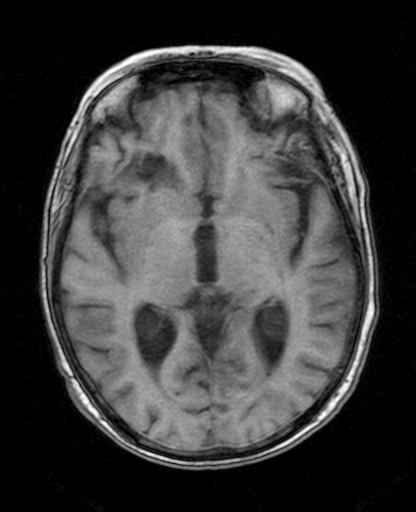
[im 40/60]
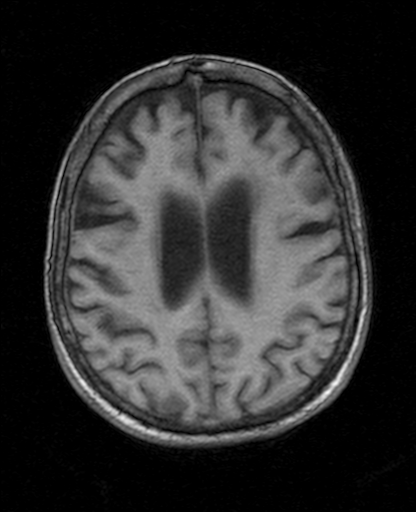

[Series 13: T2 · coronal · 5.0mm · 0.45mm/px · 4 of 29 slices shown (3 of 3)]
[im 1/29]
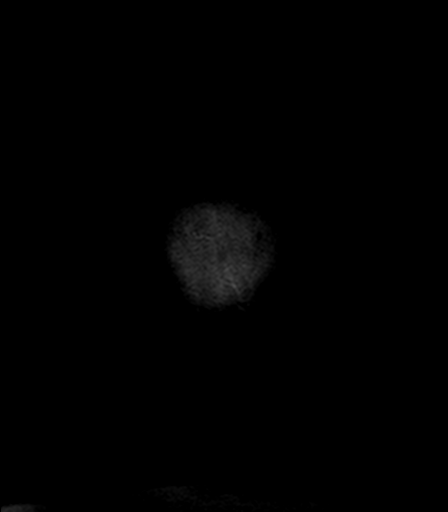
[im 10/29]
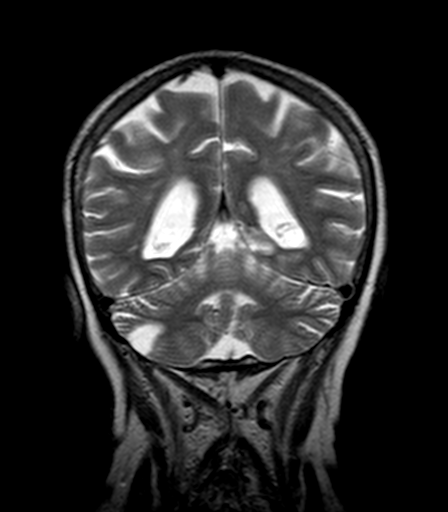
[im 19/29]
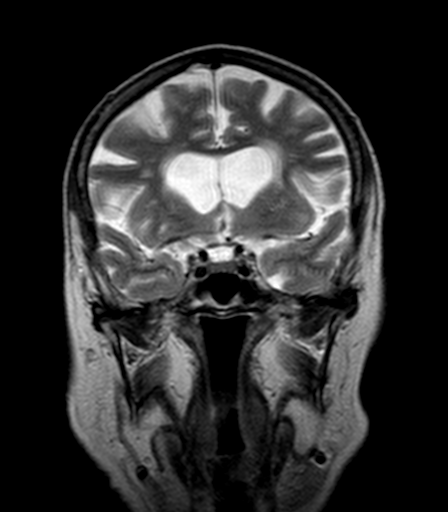
[im 29/29]
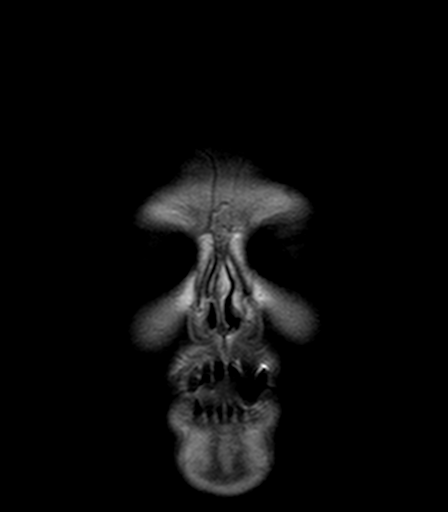

[32 of 48 positions shown; findings below may reference images not displayed]

FINDINGS: Brain: Moderate area of restricted diffusion in the left occipital
lobe. Small infarcts in the superior and posterior left thalamus and
splenium of the corpus callosum. No acute infarct in a separate
distribution. No signs of acute hemorrhage.

Remote moderate infarcts in the bilateral inferior frontal/opercular
region. Remote infarct in the peripheral inferior right cerebellum.

No hemorrhage, hydrocephalus, or mass.  Age related volume loss.

Vascular: Recent CTA.  Major flow voids are present.

Skull and upper cervical spine: Negative

Sinuses/Orbits: Negative
IMPRESSION: 1. Acute left PCA territory infarct, moderate in the occipital lobe
and mild in the thalamus.
2. Remote bilateral inferior frontal and right cerebellar infarcts.

## 2017-11-11 ENCOUNTER — Telehealth: Payer: Self-pay | Admitting: Family Medicine

## 2017-11-11 NOTE — Telephone Encounter (Signed)
Pt fasting reading has bee from 180 to 214 and Lori Berger is wanting Dr. Rosanna Randy to review readings.  They are faxing over the past 30 days readings for review.

## 2017-11-14 NOTE — Telephone Encounter (Signed)
Fax has been received

## 2017-11-21 ENCOUNTER — Encounter: Payer: Self-pay | Admitting: Family Medicine

## 2017-12-28 DIAGNOSIS — L578 Other skin changes due to chronic exposure to nonionizing radiation: Secondary | ICD-10-CM | POA: Diagnosis not present

## 2017-12-28 DIAGNOSIS — I788 Other diseases of capillaries: Secondary | ICD-10-CM | POA: Diagnosis not present

## 2017-12-28 DIAGNOSIS — L219 Seborrheic dermatitis, unspecified: Secondary | ICD-10-CM | POA: Diagnosis not present

## 2018-01-04 ENCOUNTER — Encounter: Payer: Self-pay | Admitting: Family Medicine

## 2018-01-04 ENCOUNTER — Ambulatory Visit (INDEPENDENT_AMBULATORY_CARE_PROVIDER_SITE_OTHER): Payer: Medicare Other | Admitting: Family Medicine

## 2018-01-04 ENCOUNTER — Ambulatory Visit (INDEPENDENT_AMBULATORY_CARE_PROVIDER_SITE_OTHER): Payer: Medicare Other

## 2018-01-04 VITALS — BP 118/72 | HR 60 | Temp 99.0°F | Ht 64.0 in | Wt 180.0 lb

## 2018-01-04 VITALS — BP 118/72 | HR 60 | Temp 99.0°F | Ht 64.0 in | Wt 180.2 lb

## 2018-01-04 DIAGNOSIS — M8580 Other specified disorders of bone density and structure, unspecified site: Secondary | ICD-10-CM

## 2018-01-04 DIAGNOSIS — E119 Type 2 diabetes mellitus without complications: Secondary | ICD-10-CM

## 2018-01-04 DIAGNOSIS — Z0000 Encounter for general adult medical examination without abnormal findings: Secondary | ICD-10-CM | POA: Diagnosis not present

## 2018-01-04 DIAGNOSIS — E2839 Other primary ovarian failure: Secondary | ICD-10-CM

## 2018-01-04 DIAGNOSIS — I10 Essential (primary) hypertension: Secondary | ICD-10-CM | POA: Diagnosis not present

## 2018-01-04 DIAGNOSIS — E782 Mixed hyperlipidemia: Secondary | ICD-10-CM

## 2018-01-04 NOTE — Patient Instructions (Signed)
Lori Berger , Thank you for taking time to come for your Medicare Wellness Visit. I appreciate your ongoing commitment to your health goals. Please review the following plan we discussed and let me know if I can assist you in the future.   Screening recommendations/referrals: Colonoscopy: Up to date Mammogram: Up to date Bone Density: Up to date Recommended yearly ophthalmology/optometry visit for glaucoma screening and checkup Recommended yearly dental visit for hygiene and checkup  Vaccinations: Influenza vaccine: Up to date Pneumococcal vaccine: Up to date Tdap vaccine: Up to date Shingles vaccine: Pt declines today.     Advanced directives: Please bring a copy of your POA (Power of Attorney) and/or Living Will to your next appointment.   Conditions/risks identified: Fall risk prevention; Recommend increasing water intake to 4 glasses a day.   Next appointment: 9:00 AM with Dr Rosanna Randy.   Preventive Care 15 Years and Older, Female Preventive care refers to lifestyle choices and visits with your health care provider that can promote health and wellness. What does preventive care include?  A yearly physical exam. This is also called an annual well check.  Dental exams once or twice a year.  Routine eye exams. Ask your health care provider how often you should have your eyes checked.  Personal lifestyle choices, including:  Daily care of your teeth and gums.  Regular physical activity.  Eating a healthy diet.  Avoiding tobacco and drug use.  Limiting alcohol use.  Practicing safe sex.  Taking low-dose aspirin every day.  Taking vitamin and mineral supplements as recommended by your health care provider. What happens during an annual well check? The services and screenings done by your health care provider during your annual well check will depend on your age, overall health, lifestyle risk factors, and family history of disease. Counseling  Your health care provider  may ask you questions about your:  Alcohol use.  Tobacco use.  Drug use.  Emotional well-being.  Home and relationship well-being.  Sexual activity.  Eating habits.  History of falls.  Memory and ability to understand (cognition).  Work and work Statistician.  Reproductive health. Screening  You may have the following tests or measurements:  Height, weight, and BMI.  Blood pressure.  Lipid and cholesterol levels. These may be checked every 5 years, or more frequently if you are over 57 years old.  Skin check.  Lung cancer screening. You may have this screening every year starting at age 32 if you have a 30-pack-year history of smoking and currently smoke or have quit within the past 15 years.  Fecal occult blood test (FOBT) of the stool. You may have this test every year starting at age 58.  Flexible sigmoidoscopy or colonoscopy. You may have a sigmoidoscopy every 5 years or a colonoscopy every 10 years starting at age 72.  Hepatitis C blood test.  Hepatitis B blood test.  Sexually transmitted disease (STD) testing.  Diabetes screening. This is done by checking your blood sugar (glucose) after you have not eaten for a while (fasting). You may have this done every 1-3 years.  Bone density scan. This is done to screen for osteoporosis. You may have this done starting at age 77.  Mammogram. This may be done every 1-2 years. Talk to your health care provider about how often you should have regular mammograms. Talk with your health care provider about your test results, treatment options, and if necessary, the need for more tests. Vaccines  Your health care provider may  recommend certain vaccines, such as:  Influenza vaccine. This is recommended every year.  Tetanus, diphtheria, and acellular pertussis (Tdap, Td) vaccine. You may need a Td booster every 10 years.  Zoster vaccine. You may need this after age 10.  Pneumococcal 13-valent conjugate (PCV13) vaccine.  One dose is recommended after age 61.  Pneumococcal polysaccharide (PPSV23) vaccine. One dose is recommended after age 72. Talk to your health care provider about which screenings and vaccines you need and how often you need them. This information is not intended to replace advice given to you by your health care provider. Make sure you discuss any questions you have with your health care provider. Document Released: 10/17/2015 Document Revised: 06/09/2016 Document Reviewed: 07/22/2015 Elsevier Interactive Patient Education  2017 [place] Prevention in the Home Falls can cause injuries. They can happen to people of all ages. There are many things you can do to make your home safe and to help prevent falls. What can I do on the outside of my home?  Regularly fix the edges of walkways and driveways and fix any cracks.  Remove anything that might make you trip as you walk through a door, such as a raised step or threshold.  Trim any bushes or trees on the path to your home.  Use bright outdoor lighting.  Clear any walking paths of anything that might make someone trip, such as rocks or tools.  Regularly check to see if handrails are loose or broken. Make sure that both sides of any steps have handrails.  Any raised decks and porches should have guardrails on the edges.  Have any leaves, snow, or ice cleared regularly.  Use sand or salt on walking paths during winter.  Clean up any spills in your garage right away. This includes oil or grease spills. What can I do in the bathroom?  Use night lights.  Install grab bars by the toilet and in the tub and shower. Do not use towel bars as grab bars.  Use non-skid mats or decals in the tub or shower.  If you need to sit down in the shower, use a plastic, non-slip stool.  Keep the floor dry. Clean up any water that spills on the floor as soon as it happens.  Remove soap buildup in the tub or shower regularly.  Attach bath  mats securely with double-sided non-slip rug tape.  Do not have throw rugs and other things on the floor that can make you trip. What can I do in the bedroom?  Use night lights.  Make sure that you have a light by your bed that is easy to reach.  Do not use any sheets or blankets that are too big for your bed. They should not hang down onto the floor.  Have a firm chair that has side arms. You can use this for support while you get dressed.  Do not have throw rugs and other things on the floor that can make you trip. What can I do in the kitchen?  Clean up any spills right away.  Avoid walking on wet floors.  Keep items that you use a lot in easy-to-reach places.  If you need to reach something above you, use a strong step stool that has a grab bar.  Keep electrical cords out of the way.  Do not use floor polish or wax that makes floors slippery. If you must use wax, use non-skid floor wax.  Do not have throw rugs and  other things on the floor that can make you trip. What can I do with my stairs?  Do not leave any items on the stairs.  Make sure that there are handrails on both sides of the stairs and use them. Fix handrails that are broken or loose. Make sure that handrails are as long as the stairways.  Check any carpeting to make sure that it is firmly attached to the stairs. Fix any carpet that is loose or worn.  Avoid having throw rugs at the top or bottom of the stairs. If you do have throw rugs, attach them to the floor with carpet tape.  Make sure that you have a light switch at the top of the stairs and the bottom of the stairs. If you do not have them, ask someone to add them for you. What else can I do to help prevent falls?  Wear shoes that:  Do not have high heels.  Have rubber bottoms.  Are comfortable and fit you well.  Are closed at the toe. Do not wear sandals.  If you use a stepladder:  Make sure that it is fully opened. Do not climb a closed  stepladder.  Make sure that both sides of the stepladder are locked into place.  Ask someone to hold it for you, if possible.  Clearly mark and make sure that you can see:  Any grab bars or handrails.  First and last steps.  Where the edge of each step is.  Use tools that help you move around (mobility aids) if they are needed. These include:  Canes.  Walkers.  Scooters.  Crutches.  Turn on the lights when you go into a dark area. Replace any light bulbs as soon as they burn out.  Set up your furniture so you have a clear path. Avoid moving your furniture around.  If any of your floors are uneven, fix them.  If there are any pets around you, be aware of where they are.  Review your medicines with your doctor. Some medicines can make you feel dizzy. This can increase your chance of falling. Ask your doctor what other things that you can do to help prevent falls. This information is not intended to replace advice given to you by your health care provider. Make sure you discuss any questions you have with your health care provider. Document Released: 07/17/2009 Document Revised: 02/26/2016 Document Reviewed: 10/25/2014 Elsevier Interactive Patient Education  2017 [employer].

## 2018-01-04 NOTE — Progress Notes (Signed)
Patient: Lori Berger Female    DOB: 12-31-1940   77 y.o.   MRN: [id number] Visit Date: 01/04/2018  Today's Provider: Wilhemena Durie, MD   Chief Complaint  Patient presents with  . Hypertension  . Hyperlipidemia  . Diabetes   Subjective:    Hyperlipidemia  This is a chronic problem. The problem is controlled. Recent lipid tests were reviewed and are normal. Pertinent negatives include no chest pain or shortness of breath. There are no compliance problems.   Diabetes  She presents for her follow-up diabetic visit. She has type 2 diabetes mellitus. Her disease course has been stable. There are no hypoglycemic associated symptoms. Pertinent negatives for hypoglycemia include no headaches or sweats. Pertinent negatives for diabetes include no blurred vision, no chest pain, no fatigue, no foot paresthesias, no foot ulcerations, no polydipsia, no polyphagia, no polyuria, no visual change, no weakness and no weight loss. Symptoms are stable. She is compliant with treatment all of the time. She is following a generally healthy diet. She participates in exercise weekly. Her home blood glucose trend is increasing steadily. (Pt reports her blood sugars has increased some since stopping Glimepiride.   ) An ACE inhibitor/angiotensin II receptor blocker is being taken. She does not see a podiatrist.Eye exam is current.  Hypertension  This is a chronic problem. The problem is unchanged. The problem is controlled. Pertinent negatives include no anxiety, blurred vision, chest pain, headaches, malaise/fatigue, neck pain, orthopnea, palpitations, peripheral edema, PND, shortness of breath or sweats. There are no associated agents to hypertension. Risk factors for coronary artery disease include dyslipidemia and diabetes mellitus. Past treatments include ACE inhibitors and calcium channel blockers. There are no compliance problems.    Lab Results  Component Value Date   CHOL 163 01/02/2017   CHOL 249  (H) 10/19/2016   CHOL 186 06/25/2015   Lab Results  Component Value Date   HDL 49 01/02/2017   HDL 40 10/19/2016   HDL 47 06/25/2015   Lab Results  Component Value Date   LDLCALC 60 01/02/2017   LDLCALC 136 (H) 10/19/2016   LDLCALC 87 06/25/2015   Lab Results  Component Value Date   TRIG 271 (H) 01/02/2017   TRIG 367 (H) 10/19/2016   TRIG 259 (H) 06/25/2015   Lab Results  Component Value Date   CHOLHDL 3.3 01/02/2017   CHOLHDL 4.0 06/25/2015   No results found for: LDLDIRECT    Lab Results  Component Value Date   HGBA1C 6.1 09/13/2017   BP Readings from Last 3 Encounters:  01/04/18 118/72  01/04/18 118/72  09/13/17 122/64      Allergies  Allergen Reactions  . Penicillins      Current Outpatient Medications:  .  amLODipine (NORVASC) 5 MG tablet, TAKE 1 TABLET BY MOUTH ONCE A DAY, Disp: 30 tablet, Rfl: 12 .  aspirin (ASPIRIN CHILDRENS) 81 MG chewable tablet, Chew 1 tablet (81 mg total) by mouth daily., Disp: 120 tablet, Rfl: 0 .  Calcium Carb-Cholecalciferol (CALCIUM 600+D) 600-800 MG-UNIT TABS, Take 1 tablet by mouth daily., Disp: , Rfl:  .  clopidogrel (PLAVIX) 75 MG tablet, Take 1 tablet (75 mg total) by mouth daily., Disp: 30 tablet, Rfl: 0 .  glucose blood test strip, Check sugar daily, Disp: 100 each, Rfl: 3 .  ibuprofen (ADVIL) 200 MG tablet, Take 1 tablet (200 mg total) by mouth every 6 (six) hours as needed for moderate pain., Disp: 30 tablet, Rfl: 0 .  INVOKAMET 150-500 MG TABS, TAKE 1 TABLET BY MOUTH TWICE A DAY, Disp: 60 tablet, Rfl: 11 .  letrozole (FEMARA) 2.5 MG tablet, Take 1 tablet (2.5 mg total) by mouth daily., Disp: 30 tablet, Rfl: 12 .  lisinopril (PRINIVIL,ZESTRIL) 20 MG tablet, TAKE 1 TABLET BY MOUTH ONCE A DAY, Disp: 30 tablet, Rfl: 12 .  simvastatin (ZOCOR) 10 MG tablet, Take 1 tablet (10 mg total) by mouth at bedtime., Disp: 90 tablet, Rfl: 3  Review of Systems  Constitutional: Negative.  Negative for fatigue, malaise/fatigue and  weight loss.  HENT: Negative.   Eyes: Negative.  Negative for blurred vision.  Respiratory: Negative for shortness of breath.   Cardiovascular: Negative for chest pain, palpitations, orthopnea and PND.  Gastrointestinal: Negative.   Endocrine: Negative.  Negative for polydipsia, polyphagia and polyuria.  Genitourinary: Negative.   Musculoskeletal: Negative.  Negative for neck pain.  Skin: Negative.   Allergic/Immunologic: Negative.   Neurological: Negative.  Negative for weakness and headaches.  Hematological: Negative.     Social History   Tobacco Use  . Smoking status: Never Smoker  . Smokeless tobacco: Never Used  Substance Use Topics  . Alcohol use: No   Objective:   BP 118/72   Pulse 60   Temp 99 F (37.2 C) (Oral)   Ht 5\' 4"  (1.626 m)   Wt 180 lb (81.6 kg)   BMI 30.90 kg/m  Vitals:   01/04/18 0906  BP: 118/72  Pulse: 60  Temp: 99 F (37.2 C)  TempSrc: Oral  Weight: 180 lb (81.6 kg)  Height: 5\' 4"  (1.626 m)     Physical Exam  Constitutional: She is oriented to person, place, and time. She appears well-developed and well-nourished.  HENT:  Head: Normocephalic and atraumatic.  Right Ear: External ear normal.  Left Ear: External ear normal.  Nose: Nose normal.  Eyes: Conjunctivae are normal.  Neck: Normal range of motion. Neck supple. Carotid bruit is not present. No thyromegaly present.  Cardiovascular: Normal rate, regular rhythm, normal heart sounds and intact distal pulses.  Pulmonary/Chest: Effort normal and breath sounds normal.  Abdominal: Soft. Bowel sounds are normal.  Neurological: She is alert and oriented to person, place, and time. She has normal reflexes.  Skin: Skin is warm and dry.  Psychiatric: She has a normal mood and affect. Her behavior is normal. Judgment and thought content normal.        Assessment & Plan:     1. Essential (primary) hypertension Stable, continue current medications.   - CBC with Differential/Platelet -  Comprehensive metabolic panel - TSH  2. Type 2 diabetes mellitus without complication, unspecified whether long term insulin use (HCC) Blood sugars stable off of Glimepiride.  Will check A1C today.  Follow up in four months.   - CBC with Differential/Platelet - Comprehensive metabolic panel - Hemoglobin A1c  3. Combined fat and carbohydrate induced hyperlipemia Stable check labs   - CBC with Differential/Platelet - Comprehensive metabolic panel - Lipid panel  4. Osteopenia, unspecified location Will refer for BMD.  - DG Bone Density; Future   Patient seen and examined by Miguel Aschoff, MD, and note scribed by Ashley Royalty, CMA       I have done the exam and reviewed the above chart and it is accurate to the best of my knowledge. Development worker, community has been used in this note in any air is in the dictation or transcription are unintentional.  Wilhemena Durie, MD  [facility]

## 2018-01-04 NOTE — Progress Notes (Signed)
Subjective:   Lori Berger is a 77 y.o. female who presents for Medicare Annual (Subsequent) preventive examination.  Review of Systems:  N/A  Cardiac Risk Factors include: advanced age (>48men, >54 women);diabetes mellitus;dyslipidemia;hypertension;obesity (BMI >30kg/m2)     Objective:     Vitals: BP 118/72 (BP Location: Left Arm)   Pulse 60   Temp 99 F (37.2 C) (Oral)   Ht 5\' 4"  (1.626 m)   Wt 180 lb 3.2 oz (81.7 kg)   BMI 30.93 kg/m   Body mass index is 30.93 kg/m.  Advanced Directives 01/04/2018 01/02/2017 01/02/2017 10/14/2016 11/03/2015 10/14/2015 03/26/2015  Does Patient Have a Medical Advance Directive? Yes Yes No Yes Yes Yes Yes  Type of Paramedic of [place];Living will [place];Living will;Out of facility DNR (pink MOST or yellow form) - Living will;Healthcare Power of [place];Living will [place];Living will -  Does patient want to make changes to medical advance directive? - Yes (Inpatient - patient defers changing a medical advance directive at this time) - - - - -  Copy of [place] in Chart? No - copy requested No - copy requested - Yes No - copy requested - -  Would patient like information on creating a medical advance directive? - Yes (Inpatient - patient defers creating a medical advance directive at this time) No - Patient declined - - - -    Tobacco Social History   Tobacco Use  Smoking Status Never Smoker  Smokeless Tobacco Never Used     Counseling given: Not Answered   Clinical Intake:  Pre-visit preparation completed: Yes  Pain : No/denies pain Pain Score: 0-No pain     Nutritional Status: BMI > 30  Obese Nutritional Risks: None Diabetes: Yes(type 2) CBG done?: No Did pt. bring in CBG monitor from home?: No  How often do you need to have someone help you when you read instructions, pamphlets, or other written materials from your  doctor or pharmacy?: 1 - Never  Interpreter Needed?: No  Information entered by :: [facility], LPN  Past Medical History:  Diagnosis Date  . Arthritis   . BCC (basal cell carcinoma)   . Breast cancer ([place]) 1990   left 1990  . Breast cancer ([place]) 11/11/2015   Right, T2 (2.4 cm(, N0; positive deep margin, ER: 100%, PR: 60%; Her 2 neu not amplified, Mammoprint: low risk  . Diabetes mellitus without complication ([place])   . Endometrial cancer ([place])   . Hyperlipidemia   . Hypertension   . Major depressive disorder   . PONV (postoperative nausea and vomiting)    Past Surgical History:  Procedure Laterality Date  . ABDOMINAL HYSTERECTOMY    . MASTECTOMY Left    1990  . SENTINEL NODE BIOPSY Right 11/11/2015   Procedure: SENTINEL NODE BIOPSY;  Surgeon: Robert Bellow, MD;  Location: [facility] ORS;  Service: General;  Laterality: Right;  . SIMPLE MASTECTOMY WITH AXILLARY SENTINEL NODE BIOPSY Right 11/11/2015   Procedure: SIMPLE MASTECTOMY;  Surgeon: Robert Bellow, MD;  Location: [facility] ORS;  Service: General;  Laterality: Right;   Family History  Problem Relation Age of Onset  . Heart disease Mother        Fatal MI age 75  . Stroke Father        fatal age 64  . Cancer Sister        Colon  . Cancer Brother  Died from Kidney cancer  . Breast cancer Neg Hx    Social History   Socioeconomic History  . Marital status: Widowed    Spouse name: widowed  . Number of children: 3  . Years of education: Not on file  . Highest education level: Some college, no degree  Occupational History  . Occupation: retired  Scientific laboratory technician  . Financial resource strain: Not hard at all  . Food insecurity:    Worry: Never true    Inability: Never true  . Transportation needs:    Medical: No    Non-medical: No  Tobacco Use  . Smoking status: Never Smoker  . Smokeless tobacco: Never Used  Substance and Sexual Activity  . Alcohol use: No  . Drug use: No  . Sexual activity: Never  Lifestyle    . Physical activity:    Days per week: Not on file    Minutes per session: Not on file  . Stress: Not at all  Relationships  . Social connections:    Talks on phone: Not on file    Gets together: Not on file    Attends religious service: Not on file    Active member of club or organization: Not on file    Attends meetings of clubs or organizations: Not on file    Relationship status: Not on file  Other Topics Concern  . Not on file  Social History Narrative  . Not on file    Outpatient Encounter Medications as of 01/04/2018  Medication Sig  . amLODipine (NORVASC) 5 MG tablet TAKE 1 TABLET BY MOUTH ONCE A DAY  . aspirin (ASPIRIN CHILDRENS) 81 MG chewable tablet Chew 1 tablet (81 mg total) by mouth daily.  . Calcium Carb-Cholecalciferol (CALCIUM 600+D) 600-800 MG-UNIT TABS Take 1 tablet by mouth daily.  . clopidogrel (PLAVIX) 75 MG tablet Take 1 tablet (75 mg total) by mouth daily.  Marland Kitchen glucose blood test strip Check sugar daily  . ibuprofen (ADVIL) 200 MG tablet Take 1 tablet (200 mg total) by mouth every 6 (six) hours as needed for moderate pain.  . INVOKAMET 150-500 MG TABS TAKE 1 TABLET BY MOUTH TWICE A DAY  . letrozole (FEMARA) 2.5 MG tablet Take 1 tablet (2.5 mg total) by mouth daily.  Marland Kitchen lisinopril (PRINIVIL,ZESTRIL) 20 MG tablet TAKE 1 TABLET BY MOUTH ONCE A DAY  . simvastatin (ZOCOR) 10 MG tablet Take 1 tablet (10 mg total) by mouth at bedtime.   No facility-administered encounter medications on file as of 01/04/2018.     Activities of Daily Living In your present state of health, do you have any difficulty performing the following activities: 01/04/2018  Hearing? 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  Vision? 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  Difficulty concentrating or making decisions? Y  Walking or climbing stairs? Y  Comment Due to balence issues. [place] steps.   Dressing or bathing? N  Doing errands, shopping? Y   Comment Does not drive  Preparing Food and eating ? N  Using the Toilet? N  In the past six months, have you accidently leaked urine? Y  Comment Occasionally, does not have wear protection.   Do you have problems with loss of bowel control? N  Managing your Medications? Y  Comment [employer] this.  Managing your Finances? Y  Comment Family takes care of this.   Housekeeping or managing your Housekeeping? Darreld Mclean  Comment [facility].   Some recent data might be hidden    Patient Care Team: Jerrol Banana., MD as PCP - General (Family Medicine) [facility], [facility] as Consulting Physician [facility])    Assessment:   This is a routine wellness examination for Lori Berger.  Exercise Activities and Dietary recommendations Current Exercise Habits: The patient does not participate in regular exercise at present, Exercise limited by: orthopedic condition(s);Other - see comments(balence issues)  Goals    . DIET - INCREASE WATER INTAKE     Recommend increasing water intake to 4 glasses a day.        Fall Risk Fall Risk  01/04/2018 10/14/2016 10/14/2015 03/26/2015  Falls in the past year? Yes Yes Yes No  Number falls in past yr: 2 or more 1 1 -  Injury with Fall? No No No -  Risk for fall due to : Impaired balance/gait History of fall(s);Other (Comment) - -  Risk for fall due to: Comment - pt gets dizzy spells - -  Follow up Falls prevention discussed - - -   Is the patient's home free of loose throw rugs in walkways, pet beds, electrical cords, etc?   yes      Grab bars in the bathroom? yes      Handrails on the stairs?   no      Adequate lighting?   yes  Timed Get Up and Go performed: N/A  Depression Screen PHQ 2/9 Scores 01/04/2018 10/14/2016 10/14/2015 03/26/2015  PHQ - 2 Score 1 0 0 0     Cognitive Function:      6CIT Screen 01/04/2018 10/14/2016  What Year? 0 points 0 points  What month? 0 points 0 points  What time? 0 points 0 points  Count back from 20 0  points 0 points  Months in reverse 0 points 0 points  Repeat phrase 4 points 2 points  Total Score 4 2    Immunization History  Administered Date(s) Administered  . Influenza Split 07/10/2011, 09/13/2012  . Influenza Whole 07/31/2017  . Influenza, High Dose Seasonal PF 06/25/2015, 10/14/2016  . Influenza,inj,Quad PF,6+ Mos 08/01/2013, 08/01/2014  . PPD Test 01/03/2017  . Pneumococcal Conjugate-13 06/06/2014  . Pneumococcal Polysaccharide-23 06/14/2012  . Tdap 05/31/2011  . Zoster 11/17/2007    Qualifies for Shingles Vaccine? Due for Shingles vaccine. Declined my offer to administer today. Education has been provided regarding the importance of this vaccine. Pt has been advised to call her insurance company to determine her out of pocket expense. Advised she may also receive this vaccine at her local pharmacy or Health Dept. Verbalized acceptance and understanding.  Screening Tests Health Maintenance  Topic Date Due  . HEMOGLOBIN A1C  03/14/2018  . INFLUENZA VACCINE  05/04/2018  . OPHTHALMOLOGY EXAM  09/07/2018  . FOOT EXAM  09/13/2018  . TETANUS/TDAP  05/30/2021  . DEXA SCAN  Completed  . PNA vac Low Risk Adult  Completed    Cancer Screenings: Lung: Low Dose CT Chest recommended if Age 17-80 years, 30 pack-year currently smoking OR have quit w/in 15years. Patient does not qualify. Breast:  Up to date on Mammogram? Yes   Up to date of Bone Density/Dexa? Yes Colorectal: Up to date  Additional Screenings:  Hepatitis C Screening: N/A     Plan:  I have personally reviewed and addressed the Medicare Annual Wellness questionnaire and have noted the following in the patient's chart:  A. Medical and social history B. Use of  alcohol, tobacco or illicit drugs  C. Current medications and supplements D. Functional ability and status E.  Nutritional status F.  Physical activity G. Advance directives H. List of other physicians I.  Hospitalizations, surgeries, and ER visits in  previous 12 months J.  [place] such as hearing and vision if needed, cognitive and depression L. Referrals and appointments - none  In addition, I have reviewed and discussed with patient certain preventive protocols, quality metrics, and best practice recommendations. A written personalized care plan for preventive services as well as general preventive health recommendations were provided to patient.  See attached scanned questionnaire for additional information.   Signed,  Fabio Neighbors, LPN Nurse Health Advisor   Nurse Recommendations: None.

## 2018-01-05 LAB — TSH: TSH: 2.18 u[IU]/mL (ref 0.450–4.500)

## 2018-01-05 LAB — LIPID PANEL
Chol/HDL Ratio: 3 ratio (ref 0.0–4.4)
Cholesterol, Total: 140 mg/dL (ref 100–199)
HDL: 47 mg/dL (ref >39)
LDL CALC: 57 mg/dL (ref 0–99)
Triglycerides: 182 mg/dL — ABNORMAL HIGH (ref 0–149)
VLDL CHOLESTEROL CAL: 36 mg/dL (ref 5–40)

## 2018-01-05 LAB — COMPREHENSIVE METABOLIC PANEL
ALBUMIN: 4.4 g/dL (ref 3.5–4.8)
ALT: 15 IU/L (ref 0–32)
AST: 22 IU/L (ref 0–40)
Albumin/Globulin Ratio: 1.9 (ref 1.2–2.2)
Alkaline Phosphatase: 83 IU/L (ref 39–117)
BUN / CREAT RATIO: 24 (ref 12–28)
BUN: 19 mg/dL (ref 8–27)
Bilirubin Total: 0.3 mg/dL (ref 0.0–1.2)
CO2: 22 mmol/L (ref 20–29)
CREATININE: 0.79 mg/dL (ref 0.57–1.00)
Calcium: 10 mg/dL (ref 8.7–10.3)
Chloride: 104 mmol/L (ref 96–106)
GFR calc non Af Amer: 72 mL/min/{1.73_m2} (ref >59)
GFR, EST AFRICAN AMERICAN: 83 mL/min/{1.73_m2} (ref >59)
Globulin, Total: 2.3 g/dL (ref 1.5–4.5)
Glucose: 164 mg/dL — ABNORMAL HIGH (ref 65–99)
Potassium: 4.6 mmol/L (ref 3.5–5.2)
Sodium: 144 mmol/L (ref 134–144)
TOTAL PROTEIN: 6.7 g/dL (ref 6.0–8.5)

## 2018-01-05 LAB — CBC WITH DIFFERENTIAL/PLATELET
BASOS ABS: 0 10*3/uL (ref 0.0–0.2)
Basos: 0 %
EOS (ABSOLUTE): 0.1 10*3/uL (ref 0.0–0.4)
Eos: 2 %
HEMOGLOBIN: 14.2 g/dL (ref 11.1–15.9)
Hematocrit: 41.9 % (ref 34.0–46.6)
IMMATURE GRANS (ABS): 0 10*3/uL (ref 0.0–0.1)
Immature Granulocytes: 0 %
LYMPHS: 26 %
Lymphocytes Absolute: 1.8 10*3/uL (ref 0.7–3.1)
MCH: 31.1 pg (ref 26.6–33.0)
MCHC: 33.9 g/dL (ref 31.5–35.7)
MCV: 92 fL (ref 79–97)
Monocytes Absolute: 0.5 10*3/uL (ref 0.1–0.9)
Monocytes: 7 %
Neutrophils Absolute: 4.4 10*3/uL (ref 1.4–7.0)
Neutrophils: 65 %
Platelets: 222 10*3/uL (ref 150–379)
RBC: 4.57 x10E6/uL (ref 3.77–5.28)
RDW: 13.3 % (ref 12.3–15.4)
WBC: 6.8 10*3/uL (ref 3.4–10.8)

## 2018-01-05 LAB — HEMOGLOBIN A1C
Est. average glucose Bld gHb Est-mCnc: 183 mg/dL
HEMOGLOBIN A1C: 8 % — AB (ref 4.8–5.6)

## 2018-01-18 ENCOUNTER — Ambulatory Visit: Payer: Medicare Other | Admitting: Family Medicine

## 2018-02-09 ENCOUNTER — Other Ambulatory Visit: Payer: Medicare Other

## 2018-02-15 ENCOUNTER — Ambulatory Visit
Admission: RE | Admit: 2018-02-15 | Discharge: 2018-02-15 | Disposition: A | Discharge status: Home / Self Care | Payer: Medicare Other | Source: Ambulatory Visit | Attending: Family Medicine | Admitting: Family Medicine

## 2018-02-15 DIAGNOSIS — M8589 Other specified disorders of bone density and structure, multiple sites: Secondary | ICD-10-CM | POA: Diagnosis not present

## 2018-02-15 DIAGNOSIS — M8588 Other specified disorders of bone density and structure, other site: Secondary | ICD-10-CM | POA: Diagnosis not present

## 2018-02-15 DIAGNOSIS — M8580 Other specified disorders of bone density and structure, unspecified site: Secondary | ICD-10-CM | POA: Insufficient documentation

## 2018-02-15 DIAGNOSIS — E2839 Other primary ovarian failure: Secondary | ICD-10-CM | POA: Insufficient documentation

## 2018-04-17 ENCOUNTER — Ambulatory Visit (INDEPENDENT_AMBULATORY_CARE_PROVIDER_SITE_OTHER): Payer: Medicare Other | Admitting: Family Medicine

## 2018-04-17 ENCOUNTER — Encounter: Payer: Self-pay | Admitting: Family Medicine

## 2018-04-17 ENCOUNTER — Ambulatory Visit
Admission: RE | Admit: 2018-04-17 | Discharge: 2018-04-17 | Disposition: A | Discharge status: Home / Self Care | Payer: Medicare Other | Source: Ambulatory Visit | Attending: Family Medicine | Admitting: Family Medicine

## 2018-04-17 VITALS — BP 110/72 | HR 96 | Temp 98.1°F | Resp 16 | Wt 179.6 lb

## 2018-04-17 DIAGNOSIS — M7989 Other specified soft tissue disorders: Secondary | ICD-10-CM | POA: Diagnosis not present

## 2018-04-17 DIAGNOSIS — S8992XA Unspecified injury of left lower leg, initial encounter: Secondary | ICD-10-CM | POA: Insufficient documentation

## 2018-04-17 DIAGNOSIS — S8002XA Contusion of left knee, initial encounter: Secondary | ICD-10-CM | POA: Diagnosis not present

## 2018-04-17 DIAGNOSIS — W19XXXA Unspecified fall, initial encounter: Secondary | ICD-10-CM | POA: Diagnosis not present

## 2018-04-17 DIAGNOSIS — M1712 Unilateral primary osteoarthritis, left knee: Secondary | ICD-10-CM | POA: Insufficient documentation

## 2018-04-17 NOTE — Progress Notes (Signed)
  Subjective:     Patient ID: Lori Berger, female   DOB: 25-Sep-1941, 77 y.o.   MRN: [id number] Chief Complaint  Patient presents with  . Fall    Patient comes in office today with concerns of swelling and discoloration of skin. Patient reports on 04/04/18 she was walking the hall at [facility] when she tripped and fell landing on her left knee. Patient reports swelling of left knee and ankle and discoloration of her second and third toe.    HPI States she wasn't using her cane at the time and denies dizziness. Reports she has continued walking and is not having any significant pain. Reports bruising drifting down to her toes over time. Accompanied by her daughter-in-law, Mariann Laster, [phone].  Review of Systems     Objective:   Physical Exam  Constitutional: She appears well-developed and well-nourished. No distress.  Cardiovascular:  Pulses:      Dorsalis pedis pulses are 2+ on the left side.       Posterior tibial pulses are 2+ on the left side.  Musculoskeletal:  Ankle ligaments stable with ecchymosis of her left 2-4 toes noted.  Left knee is swollen with mild tenderness over the patella. Flexion to 90 degrees with full extension.       Assessment:    1. Left knee injury, initial encounter - DG Knee Complete 4 Views Left; Future    Plan:    Encouraged to use her cane as she is doing today. May continue leg elevation and use ibuprofen or E.S. Tylenol for pain. Further f/u pending x-ray result.

## 2018-04-17 NOTE — Patient Instructions (Addendum)
We will call you with the x-ray report. OK to have an occasional ibuprofen 400 mg. Tylenol extra. strength for pain. Continue to use your cane for ambulation.

## 2018-04-18 ENCOUNTER — Telehealth: Payer: Self-pay | Admitting: Family Medicine

## 2018-04-18 NOTE — Telephone Encounter (Signed)
May give verbal order for cold compresses to knee for 20 minutes at mid day and 5-6 PM daily for 7 days.

## 2018-04-18 NOTE — Telephone Encounter (Signed)
Patients daughter states that Lori Berger has to have this as a written order, they will not take verbal she says, [name]

## 2018-04-18 NOTE — Telephone Encounter (Signed)
done

## 2018-04-18 NOTE — Telephone Encounter (Signed)
Please review. KW 

## 2018-04-18 NOTE — Telephone Encounter (Signed)
Pt's daughter in law called saying Lori Berger needs an order to put ice bags on Lori Berger's knee for swelling as ordered yesterday.   Thanks [employer]

## 2018-05-10 ENCOUNTER — Ambulatory Visit: Payer: Medicare Other | Admitting: Family Medicine

## 2018-05-18 ENCOUNTER — Ambulatory Visit (INDEPENDENT_AMBULATORY_CARE_PROVIDER_SITE_OTHER): Payer: Medicare Other | Admitting: Family Medicine

## 2018-05-18 VITALS — BP 122/64 | HR 90 | Temp 97.6°F | Resp 18 | Wt 177.0 lb

## 2018-05-18 DIAGNOSIS — C50919 Malignant neoplasm of unspecified site of unspecified female breast: Secondary | ICD-10-CM | POA: Diagnosis not present

## 2018-05-18 DIAGNOSIS — E7849 Other hyperlipidemia: Secondary | ICD-10-CM

## 2018-05-18 DIAGNOSIS — E119 Type 2 diabetes mellitus without complications: Secondary | ICD-10-CM | POA: Diagnosis not present

## 2018-05-18 DIAGNOSIS — I10 Essential (primary) hypertension: Secondary | ICD-10-CM | POA: Diagnosis not present

## 2018-05-18 DIAGNOSIS — M8580 Other specified disorders of bone density and structure, unspecified site: Secondary | ICD-10-CM

## 2018-05-18 DIAGNOSIS — I63331 Cerebral infarction due to thrombosis of right posterior cerebral artery: Secondary | ICD-10-CM | POA: Diagnosis not present

## 2018-05-18 NOTE — Progress Notes (Signed)
Lori Berger  MRN: [id number] DOB: 09-29-1941  Subjective:  HPI   The patient is a 77 year old female who presents for follow up of chronic health.  She was last seen for this on 01/04/18.  She has had an acute visit in the office since that time.   Diabetes-Her last A1C was done on 01/04/18 and was 8.0.  The patient states that her glucose has been running about 150 most of the time.  She said that there has been an occasion where it will go up to 200.  She reports no hypoglycemic events.    Hypertension-Her blood pressure readings have been stable.  Overall she feels well and is stable.  BP Readings from Last 3 Encounters:  05/18/18 122/64  04/17/18 110/72  01/04/18 118/72     Patient Active Problem List   Diagnosis Date Noted  . Stroke ([place]) 01/02/2017  . Acute lower UTI 01/02/2017  . 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 Personal history of malignant neoplasm of breast 06/29/2016  . Malignant neoplasm of right female breast ([place]) 03/30/2016  . Loss of weight 03/30/2016  . Faintness 01/26/2016  . Osteopenia 01/26/2016  . Breast cancer, female, right 09/26/2015  . Diabetes mellitus, type 2 ([place]) 02/07/2015  . Malignant neoplasm of corpus uteri, except isthmus ([place]) 02/07/2015  . Polyp of corpus uteri 02/07/2015  . Essential (primary) hypertension 02/07/2015  . Need for prophylactic hormone replacement therapy (postmenopausal) 02/07/2015  . Decreased potassium in the blood 02/07/2015  . 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 Malaise and fatigue 02/07/2015  . Combined fat and carbohydrate induced hyperlipemia 02/07/2015  . Adiposity 02/07/2015  . Arthritis, degenerative 02/07/2015  . Allergic rhinitis 02/07/2015  . Postmenopausal bleeding 02/07/2015  . Psychosis ([place]) 02/07/2015  . Inflammation of sacroiliac joint ([place]) 02/07/2015    Past Medical History:  Diagnosis Date  . Arthritis   . BCC (basal cell carcinoma)   . Breast cancer ([place]) 1990   left 1990  .  Breast cancer ([place]) 11/11/2015   Right, T2 (2.4 cm(, N0; positive deep margin, ER: 100%, PR: 60%; Her 2 neu not amplified, Mammoprint: low risk  . Diabetes mellitus without complication ([place])   . Endometrial cancer ([place])   . Hyperlipidemia   . Hypertension   . Major depressive disorder   . PONV (postoperative nausea and vomiting)     Social History   Socioeconomic History  . Marital status: Widowed    Spouse name: widowed  . Number of children: 3  . Years of education: Not on file  . Highest education level: Some college, no degree  Occupational History  . Occupation: retired  Scientific laboratory technician  . Financial resource strain: Not hard at all  . Food insecurity:    Worry: Never true    Inability: Never true  . Transportation needs:    Medical: No    Non-medical: No  Tobacco Use  . Smoking status: Never Smoker  . Smokeless tobacco: Never Used  Substance and Sexual Activity  . Alcohol use: No  . Drug use: No  . Sexual activity: Never  Lifestyle  . Physical activity:    Days per week: Not on file    Minutes per session: Not on file  . Stress: Not at all  Relationships  . Social connections:    Talks on phone: Not on file    Gets together: Not on file    Attends religious service: Not on file    Active member  of club or organization: Not on file    Attends meetings of clubs or organizations: Not on file    Relationship status: Not on file  . Intimate partner violence:    Fear of current or ex partner: Not on file    Emotionally abused: Not on file    Physically abused: Not on file    Forced sexual activity: Not on file  Other Topics Concern  . Not on file  Social History Narrative  . Not on file    Outpatient Encounter Medications as of 05/18/2018  Medication Sig  . amLODipine (NORVASC) 5 MG tablet TAKE 1 TABLET BY MOUTH ONCE A DAY  . aspirin (ASPIRIN CHILDRENS) 81 MG chewable tablet Chew 1 tablet (81 mg total) by mouth daily.  . Calcium Carb-Cholecalciferol (CALCIUM  600+D) 600-800 MG-UNIT TABS Take 1 tablet by mouth daily.  . clopidogrel (PLAVIX) 75 MG tablet Take 1 tablet (75 mg total) by mouth daily.  Marland Kitchen glucose blood test strip Check sugar daily  . ibuprofen (ADVIL) 200 MG tablet Take 1 tablet (200 mg total) by mouth every 6 (six) hours as needed for moderate pain.  . INVOKAMET 150-500 MG TABS TAKE 1 TABLET BY MOUTH TWICE A DAY  . letrozole (FEMARA) 2.5 MG tablet Take 1 tablet (2.5 mg total) by mouth daily.  Marland Kitchen lisinopril (PRINIVIL,ZESTRIL) 20 MG tablet TAKE 1 TABLET BY MOUTH ONCE A DAY  . simvastatin (ZOCOR) 10 MG tablet Take 1 tablet (10 mg total) by mouth at bedtime.   No facility-administered encounter medications on file as of 05/18/2018.     Allergies  Allergen Reactions  . Penicillins     Review of Systems  Constitutional: Negative for fever.  Eyes: Negative.   Respiratory: Negative for cough, shortness of breath and wheezing.   Cardiovascular: Negative for chest pain, palpitations, orthopnea, claudication and leg swelling.  Musculoskeletal: Positive for joint pain (knee still has swelling after her fall).  Skin: Negative.   Neurological: Negative for headaches. Dizziness: on occasion.  Endo/Heme/Allergies: Negative.   Psychiatric/Behavioral: Negative.     Objective:  BP 122/64 (BP Location: Right Arm, Patient Position: Sitting, Cuff Size: Normal)   Pulse 90   Temp 97.6 F (36.4 C) (Oral)   Resp 18   Wt 177 lb (80.3 kg)   BMI 30.38 kg/m   Physical Exam  Constitutional: She is oriented to person, place, and time and well-developed, well-nourished, and in no distress.  HENT:  Head: Normocephalic and atraumatic.  Right Ear: External ear normal.  Left Ear: External ear normal.  Nose: Nose normal.  Eyes: Conjunctivae are normal.  Neck: No thyromegaly present.  Cardiovascular: Normal rate, regular rhythm, normal heart sounds and intact distal pulses.  Pulmonary/Chest: Effort normal and breath sounds normal.  Abdominal: Soft.    Musculoskeletal: She exhibits no edema.  Neurological: She is alert and oriented to person, place, and time. Gait normal. GCS score is 15.  Skin: Skin is warm and dry.  Psychiatric: Mood, memory, affect and judgment normal.    Assessment and Plan :  1. Essential (primary) hypertension  - Comprehensive metabolic panel  2. Type 2 diabetes mellitus without complication, unspecified whether long term insulin use (HCC)  - Hemoglobin A1c  3. Other hyperlipidemia 4.H/o breast cancer 5.h/o CVA 6.Osteopenia Repeat BMD 2021.  I have done the exam and reviewed the chart and it is accurate to the best of my knowledge. Development worker, community has been used and  any errors in dictation or transcription  are unintentional. Miguel Aschoff M.D. [facility]  - Lipid panel

## 2018-05-19 LAB — LIPID PANEL
Chol/HDL Ratio: 3.5 ratio (ref 0.0–4.4)
Cholesterol, Total: 152 mg/dL (ref 100–199)
HDL: 44 mg/dL (ref >39)
LDL CALC: 62 mg/dL (ref 0–99)
Triglycerides: 229 mg/dL — ABNORMAL HIGH (ref 0–149)
VLDL Cholesterol Cal: 46 mg/dL — ABNORMAL HIGH (ref 5–40)

## 2018-05-19 LAB — COMPREHENSIVE METABOLIC PANEL
A/G RATIO: 1.9 (ref 1.2–2.2)
ALT: 8 IU/L (ref 0–32)
AST: 15 IU/L (ref 0–40)
Albumin: 4.4 g/dL (ref 3.5–4.8)
Alkaline Phosphatase: 81 IU/L (ref 39–117)
BUN/Creatinine Ratio: 21 (ref 12–28)
BUN: 20 mg/dL (ref 8–27)
Bilirubin Total: 0.3 mg/dL (ref 0.0–1.2)
CALCIUM: 10 mg/dL (ref 8.7–10.3)
CO2: 18 mmol/L — ABNORMAL LOW (ref 20–29)
Chloride: 104 mmol/L (ref 96–106)
Creatinine, Ser: 0.95 mg/dL (ref 0.57–1.00)
GFR calc Af Amer: 66 mL/min/{1.73_m2} (ref >59)
GFR, EST NON AFRICAN AMERICAN: 58 mL/min/{1.73_m2} — AB (ref >59)
GLOBULIN, TOTAL: 2.3 g/dL (ref 1.5–4.5)
Glucose: 172 mg/dL — ABNORMAL HIGH (ref 65–99)
POTASSIUM: 4.7 mmol/L (ref 3.5–5.2)
SODIUM: 142 mmol/L (ref 134–144)
Total Protein: 6.7 g/dL (ref 6.0–8.5)

## 2018-05-19 LAB — HEMOGLOBIN A1C
ESTIMATED AVERAGE GLUCOSE: 189 mg/dL
Hgb A1c MFr Bld: 8.2 % — ABNORMAL HIGH (ref 4.8–5.6)

## 2018-07-19 DIAGNOSIS — L820 Inflamed seborrheic keratosis: Secondary | ICD-10-CM | POA: Diagnosis not present

## 2018-07-19 DIAGNOSIS — L219 Seborrheic dermatitis, unspecified: Secondary | ICD-10-CM | POA: Diagnosis not present

## 2018-07-19 DIAGNOSIS — L821 Other seborrheic keratosis: Secondary | ICD-10-CM | POA: Diagnosis not present

## 2018-07-28 DIAGNOSIS — Z23 Encounter for immunization: Secondary | ICD-10-CM | POA: Diagnosis not present

## 2018-09-08 ENCOUNTER — Other Ambulatory Visit: Payer: Self-pay

## 2018-09-08 ENCOUNTER — Emergency Department: Payer: Medicare Other

## 2018-09-08 ENCOUNTER — Emergency Department
Admission: EM | Admit: 2018-09-08 | Discharge: 2018-09-08 | Disposition: A | Discharge status: Home / Self Care | Payer: Medicare Other | Attending: Student in an Organized Health Care Education/Training Program | Admitting: Student in an Organized Health Care Education/Training Program

## 2018-09-08 ENCOUNTER — Telehealth: Payer: Self-pay

## 2018-09-08 ENCOUNTER — Ambulatory Visit (INDEPENDENT_AMBULATORY_CARE_PROVIDER_SITE_OTHER): Payer: Medicare Other | Admitting: Physician Assistant

## 2018-09-08 ENCOUNTER — Encounter: Payer: Self-pay | Admitting: Physician Assistant

## 2018-09-08 ENCOUNTER — Encounter: Payer: Self-pay | Admitting: Emergency Medicine

## 2018-09-08 VITALS — BP 128/82 | HR 83 | Temp 98.3°F | Resp 16 | Wt 177.8 lb

## 2018-09-08 DIAGNOSIS — Z853 Personal history of malignant neoplasm of breast: Secondary | ICD-10-CM | POA: Diagnosis not present

## 2018-09-08 DIAGNOSIS — S199XXA Unspecified injury of neck, initial encounter: Secondary | ICD-10-CM | POA: Diagnosis not present

## 2018-09-08 DIAGNOSIS — S0012XA Contusion of left eyelid and periocular area, initial encounter: Secondary | ICD-10-CM

## 2018-09-08 DIAGNOSIS — Z8673 Personal history of transient ischemic attack (TIA), and cerebral infarction without residual deficits: Secondary | ICD-10-CM | POA: Diagnosis not present

## 2018-09-08 DIAGNOSIS — Z79899 Other long term (current) drug therapy: Secondary | ICD-10-CM | POA: Diagnosis not present

## 2018-09-08 DIAGNOSIS — Y92129 Unspecified place in nursing home as the place of occurrence of the external cause: Secondary | ICD-10-CM | POA: Insufficient documentation

## 2018-09-08 DIAGNOSIS — Z7982 Long term (current) use of aspirin: Secondary | ICD-10-CM | POA: Diagnosis not present

## 2018-09-08 DIAGNOSIS — W19XXXA Unspecified fall, initial encounter: Secondary | ICD-10-CM

## 2018-09-08 DIAGNOSIS — E119 Type 2 diabetes mellitus without complications: Secondary | ICD-10-CM | POA: Diagnosis not present

## 2018-09-08 DIAGNOSIS — S0003XA Contusion of scalp, initial encounter: Secondary | ICD-10-CM | POA: Diagnosis not present

## 2018-09-08 DIAGNOSIS — Z7902 Long term (current) use of antithrombotics/antiplatelets: Secondary | ICD-10-CM | POA: Diagnosis not present

## 2018-09-08 DIAGNOSIS — Y939 Activity, unspecified: Secondary | ICD-10-CM | POA: Insufficient documentation

## 2018-09-08 DIAGNOSIS — I10 Essential (primary) hypertension: Secondary | ICD-10-CM | POA: Insufficient documentation

## 2018-09-08 DIAGNOSIS — S0083XA Contusion of other part of head, initial encounter: Secondary | ICD-10-CM | POA: Diagnosis not present

## 2018-09-08 DIAGNOSIS — I63331 Cerebral infarction due to thrombosis of right posterior cerebral artery: Secondary | ICD-10-CM | POA: Diagnosis not present

## 2018-09-08 DIAGNOSIS — Y999 Unspecified external cause status: Secondary | ICD-10-CM | POA: Insufficient documentation

## 2018-09-08 DIAGNOSIS — W1839XA Other fall on same level, initial encounter: Secondary | ICD-10-CM | POA: Diagnosis not present

## 2018-09-08 DIAGNOSIS — T148XXA Other injury of unspecified body region, initial encounter: Secondary | ICD-10-CM | POA: Diagnosis not present

## 2018-09-08 DIAGNOSIS — S0990XA Unspecified injury of head, initial encounter: Secondary | ICD-10-CM | POA: Diagnosis present

## 2018-09-08 MED ORDER — BACITRACIN ZINC 500 UNIT/GM EX OINT
TOPICAL_OINTMENT | Freq: Once | CUTANEOUS | Status: AC
  Administered 2018-09-08: 1 via TOPICAL
  Filled 2018-09-08 (×2): qty 0.9

## 2018-09-08 NOTE — Discharge Instructions (Addendum)
Please apply triple antibiotic ointment, such as Neosporin or bacitracin, twice daily for the next week to abrasion and wound noted to the left face.

## 2018-09-08 NOTE — Telephone Encounter (Signed)
Agree.  Of note, patient is on DAPT.  Virginia Crews, MD, MPH [facility] 09/08/2018 2:08 PM

## 2018-09-08 NOTE — ED Triage Notes (Signed)
PT to ED via POV with c/o mechanical fall today with + head injury. PT takes plavix. PT has hematoma and bruising noted around left eye. PT denies headache. A&OX4, speech clear, at baseline per family.

## 2018-09-08 NOTE — Progress Notes (Signed)
Established Patient Office Visit  Subjective:  Patient ID: Lori Berger, female    DOB: 1940-10-22  Age: 77 y.o. MRN: [id number]  CC:  Chief Complaint  Patient presents with  . Facial Laceration    HPI Lori Berger presents for a facial lesion, knot, and bruisng on her left side of face. Patient reports she fell this afternoon around 1:40 pm at [facility]. Patient reports she turned and fell might have tripped over her own feet. Patient denies any loss consciousness, headache, nausea or vomiting.   Past Medical History:  Diagnosis Date  . Arthritis   . BCC (basal cell carcinoma)   . Breast cancer ([place]) 1990   left 1990  . Breast cancer ([place]) 11/11/2015   Right, T2 (2.4 cm(, N0; positive deep margin, ER: 100%, PR: 60%; Her 2 neu not amplified, Mammoprint: low risk  . Diabetes mellitus without complication ([place])   . Endometrial cancer ([place])   . Hyperlipidemia   . Hypertension   . Major depressive disorder   . PONV (postoperative nausea and vomiting)     Past Surgical History:  Procedure Laterality Date  . ABDOMINAL HYSTERECTOMY    . MASTECTOMY Left    1990  . SENTINEL NODE BIOPSY Right 11/11/2015   Procedure: SENTINEL NODE BIOPSY;  Surgeon: Robert Bellow, MD;  Location: [facility] ORS;  Service: General;  Laterality: Right;  . SIMPLE MASTECTOMY WITH AXILLARY SENTINEL NODE BIOPSY Right 11/11/2015   Procedure: SIMPLE MASTECTOMY;  Surgeon: Robert Bellow, MD;  Location: [facility] ORS;  Service: General;  Laterality: Right;    Family History  Problem Relation Age of Onset  . Heart disease Mother        Fatal MI age 58  . Stroke Father        fatal age 11  . Cancer Sister        Colon  . Cancer Brother        Died from Kidney cancer  . Breast cancer Neg Hx     Social History   Socioeconomic History  . Marital status: Widowed    Spouse name: widowed  . Number of children: 3  . Years of education: Not on file  . Highest education level: Some college, no degree    Occupational History  . Occupation: retired  Scientific laboratory technician  . Financial resource strain: Not hard at all  . Food insecurity:    Worry: Never true    Inability: Never true  . Transportation needs:    Medical: No    Non-medical: No  Tobacco Use  . Smoking status: Never Smoker  . Smokeless tobacco: Never Used  Substance and Sexual Activity  . Alcohol use: No  . Drug use: No  . Sexual activity: Never  Lifestyle  . Physical activity:    Days per week: Not on file    Minutes per session: Not on file  . Stress: Not at all  Relationships  . Social connections:    Talks on phone: Not on file    Gets together: Not on file    Attends religious service: Not on file    Active member of club or organization: Not on file    Attends meetings of clubs or organizations: Not on file    Relationship status: Not on file  . Intimate partner violence:    Fear of current or ex partner: Not on file    Emotionally abused: Not on file    Physically abused: Not  on file    Forced sexual activity: Not on file  Other Topics Concern  . Not on file  Social History Narrative  . Not on file    Outpatient Medications Prior to Visit  Medication Sig Dispense Refill  . amLODipine (NORVASC) 5 MG tablet TAKE 1 TABLET BY MOUTH ONCE A DAY 30 tablet 12  . aspirin (ASPIRIN CHILDRENS) 81 MG chewable tablet Chew 1 tablet (81 mg total) by mouth daily. 120 tablet 0  . Calcium Carb-Cholecalciferol (CALCIUM 600+D) 600-800 MG-UNIT TABS Take 1 tablet by mouth daily.    . clopidogrel (PLAVIX) 75 MG tablet Take 1 tablet (75 mg total) by mouth daily. 30 tablet 0  . glucose blood test strip Check sugar daily 100 each 3  . ibuprofen (ADVIL) 200 MG tablet Take 1 tablet (200 mg total) by mouth every 6 (six) hours as needed for moderate pain. 30 tablet 0  . INVOKAMET 150-500 MG TABS TAKE 1 TABLET BY MOUTH TWICE A DAY 60 tablet 11  . letrozole (FEMARA) 2.5 MG tablet Take 1 tablet (2.5 mg total) by mouth daily. 30 tablet 12  .  lisinopril (PRINIVIL,ZESTRIL) 20 MG tablet TAKE 1 TABLET BY MOUTH ONCE A DAY 30 tablet 12  . simvastatin (ZOCOR) 10 MG tablet Take 1 tablet (10 mg total) by mouth at bedtime. 90 tablet 3   No facility-administered medications prior to visit.     Allergies  Allergen Reactions  . Penicillins     ROS Review of Systems  Constitutional: Negative.   HENT: Positive for facial swelling. Negative for nosebleeds.   Eyes: Negative.   Respiratory: Negative.   Cardiovascular: Negative.   Skin: Positive for color change and wound.  Neurological: Negative.   Hematological: Bruises/bleeds easily.      Objective:    Physical Exam  Constitutional: She appears well-developed and well-nourished. No distress.  HENT:  Head: Head is with abrasion, with contusion and with left periorbital erythema.  Contusion with ecchymosis to the left forehead and around the left eye. Pittman abrasion noted to the left forehead. Hemostasis intact.  Eyes: Pupils are equal, round, and reactive to light. Conjunctivae and EOM are normal.  Neck: Normal range of motion. Neck supple.  Cardiovascular: Normal rate, regular rhythm and normal heart sounds. Exam reveals no gallop and no friction rub.  No murmur heard. Pulmonary/Chest: Effort normal and breath sounds normal. No respiratory distress. She has no wheezes. She has no rales.  Skin: She is not diaphoretic.  Vitals reviewed.   BP 128/82 (BP Location: Right Arm, Patient Position: Sitting, Cuff Size: Normal)   Pulse 83   Temp 98.3 F (36.8 C) (Oral)   Resp 16   Wt 177 lb 12.8 oz (80.6 kg)   SpO2 98%   BMI 30.52 kg/m  Wt Readings from Last 3 Encounters:  09/08/18 177 lb 12.8 oz (80.6 kg)  05/18/18 177 lb (80.3 kg)  04/17/18 179 lb 9.6 oz (81.5 kg)     Health Maintenance Due  Topic Date Due  . INFLUENZA VACCINE  05/04/2018  . OPHTHALMOLOGY EXAM  09/07/2018    There are no preventive care reminders to display for this patient.  Lab Results  Component  Value Date   TSH 2.180 01/04/2018   Lab Results  Component Value Date   WBC 6.8 01/04/2018   HGB 14.2 01/04/2018   HCT 41.9 01/04/2018   MCV 92 01/04/2018   PLT 222 01/04/2018   Lab Results  Component Value Date   NA  142 05/18/2018   K 4.7 05/18/2018   CO2 18 (L) 05/18/2018   GLUCOSE 172 (H) 05/18/2018   BUN 20 05/18/2018   CREATININE 0.95 05/18/2018   BILITOT 0.3 05/18/2018   ALKPHOS 81 05/18/2018   AST 15 05/18/2018   ALT 8 05/18/2018   PROT 6.7 05/18/2018   ALBUMIN 4.4 05/18/2018   CALCIUM 10.0 05/18/2018   ANIONGAP 6 01/05/2017   Lab Results  Component Value Date   CHOL 152 05/18/2018   Lab Results  Component Value Date   HDL 44 05/18/2018   Lab Results  Component Value Date   LDLCALC 62 05/18/2018   Lab Results  Component Value Date   TRIG 229 (H) 05/18/2018   Lab Results  Component Value Date   CHOLHDL 3.5 05/18/2018   Lab Results  Component Value Date   HGBA1C 8.2 (H) 05/18/2018      Assessment & Plan:   1. Fall, initial encounter Patient grossly neurovascularly intact. EOM normal. However, since patient is on plavix with superficial head injury from fall and it is after hours I have recommended for the patient to go to the ER at [facility] for further evaluation and imaging to r/o brain bleed. She is in agreement.   2. Contusion of left periocular region, initial encounter See above medical treatment plan.  3. Abrasion See above medical treatment plan.

## 2018-09-08 NOTE — ED Notes (Signed)
Reviewed discharge instructions, follow-up care, and OTC antibiotic ointment with patient. Patient verbalized understanding of all information reviewed. Patient stable, with no distress noted at this time.

## 2018-09-08 NOTE — Telephone Encounter (Signed)
Dr. Alben Spittle patient:: Patient's daughter Mariann Laster called to report patient fell today while at [facility]. Patient hit her head and now has a knot. She states [name] called her to come take patient to be evaluated. Daughter does not know if patient is having any other symptoms,nor does she know where patient hit her head at. Advised daughter that patient needs to go to the ER for evaluation. She verbalized understanding.

## 2018-09-08 NOTE — ED Notes (Signed)
Spoke with MD Siadecki about pt presentation, see orders for imaging

## 2018-09-08 NOTE — ED Provider Notes (Signed)
[facility] Emergency Department Provider Note    First MD Initiated Contact with Patient 09/08/18 2007     (approximate)  I have reviewed the triage vital signs and the nursing notes.   HISTORY  Chief Complaint Fall    HPI Lori Berger is a 77 y.o. female presents the ER for evaluation of left facial contusion and bruising that occurred after mechanical fall that occurred today at [facility].  She is on Plavix.  Denies any LOC.  Does have some mild neck pain.  No blurry vision.  Denies any chest pain or shortness of breath.  No other injuries reported.    Past Medical History:  Diagnosis Date  . Arthritis   . BCC (basal cell carcinoma)   . Breast cancer ([place]) 1990   left 1990  . Breast cancer ([place]) 11/11/2015   Right, T2 (2.4 cm(, N0; positive deep margin, ER: 100%, PR: 60%; Her 2 neu not amplified, Mammoprint: low risk  . Diabetes mellitus without complication ([place])   . Endometrial cancer ([place])   . Hyperlipidemia   . Hypertension   . Major depressive disorder   . PONV (postoperative nausea and vomiting)    Family History  Problem Relation Age of Onset  . Heart disease Mother        Fatal MI age 23  . Stroke Father        fatal age 7  . Cancer Sister        Colon  . Cancer Brother        Died from Kidney cancer  . Breast cancer Neg Hx    Past Surgical History:  Procedure Laterality Date  . ABDOMINAL HYSTERECTOMY    . MASTECTOMY Left    1990  . SENTINEL NODE BIOPSY Right 11/11/2015   Procedure: SENTINEL NODE BIOPSY;  Surgeon: Robert Bellow, MD;  Location: [facility] ORS;  Service: General;  Laterality: Right;  . SIMPLE MASTECTOMY WITH AXILLARY SENTINEL NODE BIOPSY Right 11/11/2015   Procedure: SIMPLE MASTECTOMY;  Surgeon: Robert Bellow, MD;  Location: [facility] ORS;  Service: General;  Laterality: Right;   Patient Active Problem List   Diagnosis Date Noted  . Stroke ([place]) 01/02/2017  . Acute lower UTI 01/02/2017  . Stroke (cerebrum)  ([place]) 01/02/2017  . Personal history of malignant neoplasm of breast 06/29/2016  . Malignant neoplasm of right female breast ([place]) 03/30/2016  . Loss of weight 03/30/2016  . Faintness 01/26/2016  . Osteopenia 01/26/2016  . Breast cancer, female, right 09/26/2015  . Diabetes mellitus, type 2 ([place]) 02/07/2015  . Malignant neoplasm of corpus uteri, except isthmus ([place]) 02/07/2015  . Polyp of corpus uteri 02/07/2015  . Essential (primary) hypertension 02/07/2015  . Need for prophylactic hormone replacement therapy (postmenopausal) 02/07/2015  . Decreased potassium in the blood 02/07/2015  . 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 Malaise and fatigue 02/07/2015  . Combined fat and carbohydrate induced hyperlipemia 02/07/2015  . Adiposity 02/07/2015  . Arthritis, degenerative 02/07/2015  . Allergic rhinitis 02/07/2015  . Postmenopausal bleeding 02/07/2015  . Psychosis ([place]) 02/07/2015  . Inflammation of sacroiliac joint ([place]) 02/07/2015      Prior to Admission medications   Medication Sig Start Date End Date Taking? Authorizing Provider  amLODipine (NORVASC) 5 MG tablet TAKE 1 TABLET BY MOUTH ONCE A DAY 12/06/16   Jerrol Banana., MD  aspirin (ASPIRIN CHILDRENS) 81 MG chewable tablet Chew 1 tablet (81 mg total) by mouth daily. 01/05/17   Manuella Ghazi,  [place], MD  Calcium Carb-Cholecalciferol (CALCIUM 600+D) 600-800 MG-UNIT TABS Take 1 tablet by mouth daily.    [provider]  clopidogrel (PLAVIX) 75 MG tablet Take 1 tablet (75 mg total) by mouth daily. 01/03/17   Bettey Costa, MD  glucose blood test strip Check sugar daily 04/04/17   Carmon Ginsberg, PA  ibuprofen (ADVIL) 200 MG tablet Take 1 tablet (200 mg total) by mouth every 6 (six) hours as needed for moderate pain. 01/06/17   Max Sane, MD  INVOKAMET 150-500 MG TABS TAKE 1 TABLET BY MOUTH TWICE A DAY 04/22/16   Jerrol Banana., MD  letrozole [facility]) 2.5 MG tablet Take 1 tablet (2.5 mg total) by mouth daily. 12/31/15   Robert Bellow, MD  lisinopril (PRINIVIL,ZESTRIL) 20 MG tablet TAKE 1 TABLET BY MOUTH ONCE A DAY 11/19/15   Jerrol Banana., MD  simvastatin (ZOCOR) 10 MG tablet Take 1 tablet (10 mg total) by mouth at bedtime. 10/25/16   Jerrol Banana., MD    Allergies Penicillins    Social History Social History   Tobacco Use  . Smoking status: Never Smoker  . Smokeless tobacco: Never Used  Substance Use Topics  . Alcohol use: No  . Drug use: No    Review of Systems Patient denies headaches, rhinorrhea, blurry vision, numbness, shortness of breath, chest pain, edema, cough, abdominal pain, nausea, vomiting, diarrhea, dysuria, fevers, rashes or hallucinations unless otherwise stated above in HPI. ____________________________________________   PHYSICAL EXAM:  VITAL SIGNS: Vitals:   09/08/18 1832 09/08/18 2052  BP: 125/71 (!) 142/77  Pulse: 79 60  Resp: 16 17  Temp: 98.1 F (36.7 C) 98.1 F (36.7 C)  SpO2: 100% 100%    Constitutional: Alert and oriented.  Eyes: Conjunctivae are normal.  Head: Contusion and swelling to the left forehead with superficial abrasion no laceration.  Does have periorbital ecchymosis without proptosis or swelling.  Extraocular motions are intact.  Dependent ecchymosis just below and lateral to the left eye.  Midface stable. Nose: No congestion/rhinnorhea. Mouth/Throat: Mucous membranes are moist.   Neck: No stridor. Painless ROM.  Cardiovascular: Normal rate, regular rhythm. Grossly normal heart sounds.  Good peripheral circulation. Respiratory: Normal respiratory effort.  No retractions. Lungs CTAB. Gastrointestinal: Soft and nontender. No distention. No abdominal bruits. No CVA tenderness. Genitourinary:  Musculoskeletal: No lower extremity tenderness nor edema.  No joint effusions. Neurologic:  Normal speech and language. No gross focal neurologic deficits are appreciated. No facial droop Skin:  Skin is warm, dry and intact. No rash  noted. Psychiatric: Mood and affect are normal. Speech and behavior are normal.  ____________________________________________   LABS (all labs ordered are listed, but only abnormal results are displayed)  No results found for this or any previous visit (from the past 24 hour(s)). ____________________________________________ ____________________________________________  [id number]  I personally reviewed all radiographic images ordered to evaluate for the above acute complaints and reviewed radiology reports and findings.  These findings were personally discussed with the patient.  Please see medical record for radiology report.  ____________________________________________   PROCEDURES  Procedure(s) performed:  Procedures    Critical Care performed: no ____________________________________________   INITIAL IMPRESSION / ASSESSMENT AND PLAN / ED COURSE  Pertinent labs & imaging results that were available during my care of the patient were reviewed by me and considered in my medical decision making (see chart for details).   DDX: sdh, iph, sah, concussion, abrasion, fracture  AMBERLYNN TEMPESTA is a 77  y.o. who presents to the ED with symptoms as described above.  CT imaging ordered to evaluate for the above differential fortunately shows no evidence of acute intracranial abnormality.  No evidence of fracture.  Does have swelling we discussed conservative management and expected changes over the next few days.  She has follow-up with PCP.  Discussed signs symptoms for which she should return to the ER.     As part of my medical decision making, I reviewed the following data within the [place] notes reviewed and incorporated, Labs reviewed, notes from prior ED visits.  ____________________________________________   FINAL CLINICAL IMPRESSION(S) / ED DIAGNOSES  Final diagnoses:  Facial contusion, initial encounter  Contusion of face, initial encounter       NEW MEDICATIONS STARTED DURING THIS VISIT:  Discharge Medication List as of 09/08/2018  8:21 PM       Note:  This document was prepared using Dragon voice recognition software and may include unintentional dictation errors.    Merlyn Lot, MD 09/08/18 2106

## 2018-09-11 ENCOUNTER — Encounter: Payer: Self-pay | Admitting: Physician Assistant

## 2018-09-20 ENCOUNTER — Ambulatory Visit (INDEPENDENT_AMBULATORY_CARE_PROVIDER_SITE_OTHER): Payer: Medicare Other | Admitting: Family Medicine

## 2018-09-20 ENCOUNTER — Encounter: Payer: Self-pay | Admitting: Family Medicine

## 2018-09-20 VITALS — BP 128/76 | Temp 97.8°F | Resp 16 | Ht 64.0 in | Wt 176.0 lb

## 2018-09-20 DIAGNOSIS — I10 Essential (primary) hypertension: Secondary | ICD-10-CM

## 2018-09-20 DIAGNOSIS — Z853 Personal history of malignant neoplasm of breast: Secondary | ICD-10-CM

## 2018-09-20 DIAGNOSIS — E119 Type 2 diabetes mellitus without complications: Secondary | ICD-10-CM | POA: Diagnosis not present

## 2018-09-20 DIAGNOSIS — W19XXXD Unspecified fall, subsequent encounter: Secondary | ICD-10-CM | POA: Diagnosis not present

## 2018-09-20 DIAGNOSIS — I63331 Cerebral infarction due to thrombosis of right posterior cerebral artery: Secondary | ICD-10-CM

## 2018-09-20 DIAGNOSIS — S060X0D Concussion without loss of consciousness, subsequent encounter: Secondary | ICD-10-CM | POA: Diagnosis not present

## 2018-09-20 NOTE — Progress Notes (Signed)
Patient: Lori Berger Female    DOB: Feb 19, 1941   77 y.o.   MRN: [id number] Visit Date: 09/20/2018  Today's Provider: Wilhemena Durie, MD   Chief Complaint  Patient presents with  . Follow-up  . Fall   Subjective:     HPI    Patient comes in today for a follow up. She was last seen in the office on 09/08/18 for evaluation after a fall. She was seen by Lori Bushy, PA and it was recommended that she be seen in the ER. During her ER visit, patient reports that head CT scan was WNL. Patient still has bruising and a "knot" on her head, but she reports the pain is much better. She has had no issues since her fall.  No further falls and no postconcussive symptoms. Overall she is feeling well.  Walks with a walker and knows she needs to use this all of the time.  Allergies  Allergen Reactions  . Penicillins      Current Outpatient Medications:  .  amLODipine (NORVASC) 5 MG tablet, TAKE 1 TABLET BY MOUTH ONCE A DAY, Disp: 30 tablet, Rfl: 12 .  aspirin (ASPIRIN CHILDRENS) 81 MG chewable tablet, Chew 1 tablet (81 mg total) by mouth daily., Disp: 120 tablet, Rfl: 0 .  Calcium Carb-Cholecalciferol (CALCIUM 600+D) 600-800 MG-UNIT TABS, Take 1 tablet by mouth daily., Disp: , Rfl:  .  clopidogrel (PLAVIX) 75 MG tablet, Take 1 tablet (75 mg total) by mouth daily., Disp: 30 tablet, Rfl: 0 .  glucose blood test strip, Check sugar daily, Disp: 100 each, Rfl: 3 .  ibuprofen (ADVIL) 200 MG tablet, Take 1 tablet (200 mg total) by mouth every 6 (six) hours as needed for moderate pain., Disp: 30 tablet, Rfl: 0 .  INVOKAMET 150-500 MG TABS, TAKE 1 TABLET BY MOUTH TWICE A DAY, Disp: 60 tablet, Rfl: 11 .  letrozole (FEMARA) 2.5 MG tablet, Take 1 tablet (2.5 mg total) by mouth daily., Disp: 30 tablet, Rfl: 12 .  lisinopril (PRINIVIL,ZESTRIL) 20 MG tablet, TAKE 1 TABLET BY MOUTH ONCE A DAY, Disp: 30 tablet, Rfl: 12 .  simvastatin (ZOCOR) 10 MG tablet, Take 1 tablet (10 mg total) by mouth at  bedtime., Disp: 90 tablet, Rfl: 3  Review of Systems  Constitutional: Negative for activity change, appetite change, chills, diaphoresis, fatigue, fever and unexpected weight change.  HENT: Negative.   Eyes: Negative.   Respiratory: Negative for cough and shortness of breath.   Cardiovascular: Negative for chest pain, palpitations and leg swelling.  Gastrointestinal: Negative.   Endocrine: Negative.   Musculoskeletal: Positive for arthralgias, gait problem, myalgias and neck pain.  Skin: Positive for color change. Negative for pallor, rash and wound.  Allergic/Immunologic: Negative.   Neurological: Negative for dizziness, weakness and light-headedness.  Hematological: Bruises/bleeds easily.  Psychiatric/Behavioral: Negative.     Social History   Tobacco Use  . Smoking status: Never Smoker  . Smokeless tobacco: Never Used  Substance Use Topics  . Alcohol use: No      Objective:   BP 128/76 (BP Location: Left Arm, Patient Position: Sitting, Cuff Size: Normal)   Temp 97.8 F (36.6 C)   Resp 16   Ht 5\' 4"  (1.626 m)   Wt 176 lb (79.8 kg)   BMI 30.21 kg/m  Vitals:   09/20/18 1022  BP: 128/76  Resp: 16  Temp: 97.8 F (36.6 C)  Weight: 176 lb (79.8 kg)  Height: 5\' 4"  (1.626 m)  Physical Exam Constitutional:      Appearance: She is well-developed.  HENT:     Head: Normocephalic.     Comments: Bruising over the left eye and on the left side of the face from her fall.    Right Ear: External ear normal.     Left Ear: External ear normal.     Nose: Nose normal.  Eyes:     Conjunctiva/sclera: Conjunctivae normal.     Pupils: Pupils are equal, round, and reactive to light.  Neck:     Musculoskeletal: Normal range of motion and neck supple.  Cardiovascular:     Rate and Rhythm: Normal rate and regular rhythm.     Heart sounds: Normal heart sounds.  Pulmonary:     Effort: Pulmonary effort is normal.     Breath sounds: Normal breath sounds.  Abdominal:      Palpations: Abdomen is soft.  Musculoskeletal: Normal range of motion.  Skin:    General: Skin is warm and dry.  Neurological:     General: No focal deficit present.     Mental Status: She is alert and oriented to person, place, and time. Mental status is at baseline.     Deep Tendon Reflexes: Reflexes are normal and symmetric.  Psychiatric:        Mood and Affect: Mood normal.        Behavior: Behavior normal.        Thought Content: Thought content normal.        Judgment: Judgment normal.         Assessment & Plan    1. Fall, subsequent encounter Healing nicely  2. Concussion without loss of consciousness, subsequent encounter No sequelae  3. Type 2 diabetes mellitus without complication, unspecified whether long term insulin use (HCC) Is 8.6 today.  Fair control.  Work on lifestyle and this 77 year old with multiple comorbidities  4. Cerebrovascular accident (CVA) due to thrombosis of right posterior cerebral artery (HCC) Risk factors treated  5. Essential (primary) hypertension Controlled  6. Personal history of malignant neoplasm of breast     I have done the exam and reviewed the above chart and it is accurate to the best of my knowledge. Development worker, community has been used in this note in any air is in the dictation or transcription are unintentional.  Wilhemena Durie, MD  [place]

## 2018-10-04 ENCOUNTER — Encounter: Payer: Self-pay | Admitting: Family Medicine

## 2018-10-09 MED ORDER — LETROZOLE 2.5 MG PO TABS: 2.5000 mg | ORAL_TABLET | Freq: Every day | ORAL | 1 refills | Status: DC

## 2018-10-09 MED ORDER — LISINOPRIL 20 MG PO TABS: 20.0000 mg | ORAL_TABLET | Freq: Every day | ORAL | 0 refills | Status: DC

## 2018-10-09 MED ORDER — AMLODIPINE BESYLATE 5 MG PO TABS: 5.0000 mg | ORAL_TABLET | Freq: Every day | ORAL | 0 refills | Status: DC

## 2018-10-09 MED ORDER — CANAGLIFLOZIN-METFORMIN HCL 150-500 MG PO TABS: 1.0000 | ORAL_TABLET | Freq: Two times a day (BID) | ORAL | 0 refills | Status: DC

## 2018-10-09 MED ORDER — CLOPIDOGREL BISULFATE 75 MG PO TABS: 75.0000 mg | ORAL_TABLET | Freq: Every day | ORAL | 0 refills | Status: DC

## 2018-10-09 MED ORDER — SIMVASTATIN 10 MG PO TABS: 10.0000 mg | ORAL_TABLET | Freq: Every day | ORAL | 3 refills | Status: DC

## 2018-10-11 ENCOUNTER — Encounter: Payer: Self-pay | Admitting: Family Medicine

## 2018-10-19 ENCOUNTER — Telehealth: Payer: Self-pay | Admitting: Family Medicine

## 2018-10-19 NOTE — Telephone Encounter (Signed)
Done and sent to Medical Records to fax.

## 2018-10-19 NOTE — Telephone Encounter (Signed)
[name] requesting refill of one time use safety lancets, strips and alcohol swabs sent to [facility] [phone].  [pt checks blood sugar once daily.

## 2018-12-18 ENCOUNTER — Other Ambulatory Visit: Payer: Self-pay | Admitting: Family Medicine

## 2018-12-18 NOTE — Telephone Encounter (Signed)
Please review. Thanks!  

## 2018-12-18 NOTE — Telephone Encounter (Signed)
Christian at [facility] called for a rx for  Invokamet 150/500 mg  [facility]  CB#  [phone]  Thanks Con Memos

## 2018-12-19 MED ORDER — CANAGLIFLOZIN-METFORMIN HCL 150-500 MG PO TABS: 1.0000 | ORAL_TABLET | Freq: Two times a day (BID) | ORAL | 3 refills | Status: DC

## 2019-01-05 ENCOUNTER — Telehealth: Payer: Self-pay

## 2019-01-05 NOTE — Telephone Encounter (Signed)
Please review

## 2019-01-05 NOTE — Telephone Encounter (Signed)
[name] states Syvanna needs an order sent to [facility] for Calcium 750mg  with Vitamin D3 and K.     Contact number [phone] Sonia Side) [phone] Mariann Laster)    Thanks,   -Mickel Baas

## 2019-01-08 NOTE — Telephone Encounter (Signed)
Ok to order--thx 

## 2019-01-10 ENCOUNTER — Telehealth: Payer: Self-pay

## 2019-01-10 NOTE — Telephone Encounter (Signed)
Called pt to r/s AWV and spoke with daughter who states that she would also like pts 4 month f/u apt r/s too due to the Covid 19 restrictions and because [name] is advising pts against leaving if possible. Pt r/s her 4 month f/u and AWV to 04/26/19 @ 10:40 AM and 11:00 PM. Please advise if this needs to be changed. Thanks! -MM

## 2019-01-10 NOTE — Telephone Encounter (Signed)
agreed

## 2019-01-10 NOTE — Telephone Encounter (Signed)
Noted. Thanks.

## 2019-01-11 NOTE — Telephone Encounter (Signed)
Done

## 2019-01-17 ENCOUNTER — Ambulatory Visit: Payer: Medicare Other | Admitting: Family Medicine

## 2019-01-17 ENCOUNTER — Ambulatory Visit: Payer: Self-pay

## 2019-01-21 ENCOUNTER — Other Ambulatory Visit: Payer: Self-pay | Admitting: Family Medicine

## 2019-01-30 ENCOUNTER — Ambulatory Visit: Payer: Self-pay | Admitting: Family Medicine

## 2019-04-05 ENCOUNTER — Other Ambulatory Visit: Payer: Self-pay | Admitting: Family Medicine

## 2019-04-26 ENCOUNTER — Ambulatory Visit: Payer: Medicare Other | Admitting: Family Medicine

## 2019-04-26 ENCOUNTER — Ambulatory Visit: Payer: Medicare Other

## 2019-05-08 DIAGNOSIS — U071 COVID-19: Secondary | ICD-10-CM | POA: Diagnosis not present

## 2019-05-22 DIAGNOSIS — U071 COVID-19: Secondary | ICD-10-CM | POA: Diagnosis not present

## 2019-06-26 ENCOUNTER — Other Ambulatory Visit: Payer: Self-pay | Admitting: Family Medicine

## 2019-06-28 ENCOUNTER — Telehealth: Payer: Self-pay | Admitting: Family Medicine

## 2019-06-28 DIAGNOSIS — U071 COVID-19: Secondary | ICD-10-CM | POA: Diagnosis not present

## 2019-06-28 MED ORDER — ASPIRIN EC 81 MG PO TBEC: 81.0000 mg | DELAYED_RELEASE_TABLET | Freq: Every day | ORAL | 1 refills | Status: DC

## 2019-06-28 NOTE — Telephone Encounter (Signed)
Is this okay to change? Please advise

## 2019-06-28 NOTE — Telephone Encounter (Signed)
Lori Berger w/ [name] [phone]   Daughter is wanting pt to change pt's aspirin (ASPIRIN CHILDRENS) 81 MG . She wants pt to have capsules instead of chewables.  Pt has been taking capsules for 6 weeks.  Thanks, [employer]

## 2019-06-28 NOTE — Telephone Encounter (Signed)
Yes--pt also needs appt in next month or 2.

## 2019-07-02 ENCOUNTER — Telehealth: Payer: Self-pay | Admitting: Family Medicine

## 2019-07-02 NOTE — Telephone Encounter (Signed)
Please review. Thanks!  

## 2019-07-02 NOTE — Telephone Encounter (Signed)
Daughter in [place] called saying pt fell two times in the last month.  She has no symptoms.  They have not check her urine.  Please advise  [phone]  Con Memos

## 2019-07-03 NOTE — Telephone Encounter (Signed)
Probably needs an office visit sometime this month.  She has not been seen since December of last year

## 2019-07-03 NOTE — Telephone Encounter (Signed)
Should we bring patient in for an OV?

## 2019-07-03 NOTE — Telephone Encounter (Signed)
Pt's daughter in law called back this morning.Marland Kitchen  She wants to know if she needs to bring Ms. Lori Berger in.  She has fallen two times in the last month and she is concerned to what is going on.

## 2019-07-03 NOTE — Telephone Encounter (Signed)
Scheduled appt. Patient's daughter in law and family would like to discuss symptoms with Dr. Rosanna Randy before going forward with the PT referral.

## 2019-07-03 NOTE — Telephone Encounter (Signed)
Yes--sometime in next month. In interim we can order PT referral for fall risk.

## 2019-07-04 ENCOUNTER — Ambulatory Visit (INDEPENDENT_AMBULATORY_CARE_PROVIDER_SITE_OTHER): Payer: Medicare Other | Admitting: Family Medicine

## 2019-07-04 ENCOUNTER — Other Ambulatory Visit: Payer: Self-pay

## 2019-07-04 ENCOUNTER — Encounter: Payer: Self-pay | Admitting: Family Medicine

## 2019-07-04 VITALS — BP 132/68 | HR 84 | Resp 20 | Wt 176.0 lb

## 2019-07-04 DIAGNOSIS — I63331 Cerebral infarction due to thrombosis of right posterior cerebral artery: Secondary | ICD-10-CM | POA: Diagnosis not present

## 2019-07-04 DIAGNOSIS — W19XXXA Unspecified fall, initial encounter: Secondary | ICD-10-CM

## 2019-07-04 DIAGNOSIS — Z23 Encounter for immunization: Secondary | ICD-10-CM | POA: Diagnosis not present

## 2019-07-04 DIAGNOSIS — E119 Type 2 diabetes mellitus without complications: Secondary | ICD-10-CM

## 2019-07-04 DIAGNOSIS — I10 Essential (primary) hypertension: Secondary | ICD-10-CM | POA: Diagnosis not present

## 2019-07-04 DIAGNOSIS — E7849 Other hyperlipidemia: Secondary | ICD-10-CM

## 2019-07-04 NOTE — Progress Notes (Signed)
Patient: Lori Berger Female    DOB: 1941/08/10   78 y.o.   MRN: [id number] Visit Date: 07/04/2019  Today's Provider: Wilhemena Durie, MD   Chief Complaint  Patient presents with  . Fall   Subjective:   HPI Patient comes in today for evaluation of falls. Her daughter reports that she has fallen 3 times in the last month. She was previously using a cane to help her ambulate. Now, she is using her walker. She denies any injuries from her falls.  Overall she is doing fairly well.  This is the first time she has been out of her assisted living in 6 months due to COVID-19. Allergies  Allergen Reactions  . Penicillins      Current Outpatient Medications:  .  amLODipine (NORVASC) 5 MG tablet, TAKE 1 TABLET BY MOUTH  DAILY, Disp: 90 tablet, Rfl: 3 .  aspirin EC 81 MG tablet, Take 1 tablet (81 mg total) by mouth daily., Disp: 90 tablet, Rfl: 1 .  Calcium Carb-Cholecalciferol (CALCIUM 600+D) 600-800 MG-UNIT TABS, Take 1 tablet by mouth daily., Disp: , Rfl:  .  Canagliflozin-metFORMIN HCl (INVOKAMET) 150-500 MG TABS, Take 1 tablet by mouth 2 (two) times daily., Disp: 180 tablet, Rfl: 3 .  clopidogrel (PLAVIX) 75 MG tablet, TAKE 1 TABLET BY MOUTH  DAILY, Disp: 90 tablet, Rfl: 3 .  glucose blood test strip, Check sugar daily, Disp: 100 each, Rfl: 3 .  ibuprofen (ADVIL) 200 MG tablet, Take 1 tablet (200 mg total) by mouth every 6 (six) hours as needed for moderate pain., Disp: 30 tablet, Rfl: 0 .  letrozole (FEMARA) 2.5 MG tablet, TAKE 1 TABLET BY MOUTH  DAILY, Disp: 90 tablet, Rfl: 1 .  lisinopril (ZESTRIL) 20 MG tablet, TAKE 1 TABLET BY MOUTH  DAILY, Disp: 90 tablet, Rfl: 3 .  simvastatin (ZOCOR) 10 MG tablet, Take 1 tablet (10 mg total) by mouth at bedtime., Disp: 90 tablet, Rfl: 3  Review of Systems  Constitutional: Negative for activity change and fatigue.  Eyes: Negative.   Respiratory: Negative for cough and shortness of breath.   Cardiovascular: Negative for chest pain,  palpitations and leg swelling.  Gastrointestinal: Negative.   Musculoskeletal: Positive for arthralgias.  Allergic/Immunologic: Negative for environmental allergies.  Neurological: Negative for dizziness, tremors, weakness, light-headedness and headaches.  Psychiatric/Behavioral: Negative for agitation, self-injury, sleep disturbance and suicidal ideas. The patient is not nervous/anxious.     Social History   Tobacco Use  . Smoking status: Never Smoker  . Smokeless tobacco: Never Used  Substance Use Topics  . Alcohol use: No      Objective:   BP 132/68   Pulse 84   Resp 20   Wt 176 lb (79.8 kg)   SpO2 99%   BMI 30.21 kg/m  Vitals:   07/04/19 1413  BP: 132/68  Pulse: 84  Resp: 20  SpO2: 99%  Weight: 176 lb (79.8 kg)  Body mass index is 30.21 kg/m.   Physical Exam Vitals signs reviewed.  Constitutional:      Appearance: She is well-developed.  HENT:     Head: Normocephalic.     Right Ear: External ear normal.     Left Ear: External ear normal.     Nose: Nose normal.  Eyes:     Conjunctiva/sclera: Conjunctivae normal.     Pupils: Pupils are equal, round, and reactive to light.  Neck:     Musculoskeletal: Normal range of motion and neck  supple.  Cardiovascular:     Rate and Rhythm: Normal rate and regular rhythm.     Heart sounds: Normal heart sounds.  Pulmonary:     Effort: Pulmonary effort is normal.     Breath sounds: Normal breath sounds.  Abdominal:     Palpations: Abdomen is soft.  Musculoskeletal: Normal range of motion.  Skin:    General: Skin is warm and dry.  Neurological:     General: No focal deficit present.     Mental Status: She is alert and oriented to person, place, and time. Mental status is at baseline.     Deep Tendon Reflexes: Reflexes are normal and symmetric.     Comments: Shuffling gait.  Psychiatric:        Mood and Affect: Mood normal.        Behavior: Behavior normal.        Thought Content: Thought content normal.         Judgment: Judgment normal.      No results found for any visits on 07/04/19.     Assessment & Plan    1. Fall, initial encounter No significant injuries.  Discussed that physical therapy could probably strengthen the patient lower fall risk.  May in fact need neurology referral for evaluation later.More than 50% 25 minute visit spent in counseling or coordination of care  - Ambulatory referral to Physical Therapy  2. Type 2 diabetes mellitus without complication, unspecified whether long term insulin use (HCC) Clinically stable.  On invoke a met. - Hemoglobin A1c  3. Cerebrovascular accident (CVA) due to thrombosis of right posterior cerebral artery ([place]) All risk factors treated.  On aspirin and Plavix - CBC with Differential/Platelet  4. Essential (primary) hypertension On lisinopril - Comprehensive metabolic panel  5. Other hyperlipidemia On Zocor.  LDL goal less than 70 - TSH  6. Flu vaccine need   - Flu Vaccine QUAD High Dose(Fluad)     Wilhemena Durie, MD  [facility]

## 2019-07-05 ENCOUNTER — Ambulatory Visit: Payer: Self-pay | Admitting: Family Medicine

## 2019-07-05 DIAGNOSIS — U071 COVID-19: Secondary | ICD-10-CM | POA: Diagnosis not present

## 2019-07-05 LAB — HEMOGLOBIN A1C
Est. average glucose Bld gHb Est-mCnc: 157 mg/dL
Hgb A1c MFr Bld: 7.1 % — ABNORMAL HIGH (ref 4.8–5.6)

## 2019-07-05 LAB — CBC WITH DIFFERENTIAL/PLATELET
Basophils Absolute: 0 10*3/uL (ref 0.0–0.2)
Basos: 0 %
EOS (ABSOLUTE): 0.2 10*3/uL (ref 0.0–0.4)
Eos: 3 %
Hematocrit: 41.8 % (ref 34.0–46.6)
Hemoglobin: 14 g/dL (ref 11.1–15.9)
Immature Grans (Abs): 0 10*3/uL (ref 0.0–0.1)
Immature Granulocytes: 0 %
Lymphocytes Absolute: 1.8 10*3/uL (ref 0.7–3.1)
Lymphs: 25 %
MCH: 31.1 pg (ref 26.6–33.0)
MCHC: 33.5 g/dL (ref 31.5–35.7)
MCV: 93 fL (ref 79–97)
Monocytes Absolute: 0.4 10*3/uL (ref 0.1–0.9)
Monocytes: 6 %
Neutrophils Absolute: 4.8 10*3/uL (ref 1.4–7.0)
Neutrophils: 66 %
Platelets: 256 10*3/uL (ref 150–450)
RBC: 4.5 x10E6/uL (ref 3.77–5.28)
RDW: 12.1 % (ref 11.7–15.4)
WBC: 7.2 10*3/uL (ref 3.4–10.8)

## 2019-07-05 LAB — COMPREHENSIVE METABOLIC PANEL
ALT: 10 IU/L (ref 0–32)
AST: 13 IU/L (ref 0–40)
Albumin/Globulin Ratio: 2.1 (ref 1.2–2.2)
Albumin: 4.1 g/dL (ref 3.7–4.7)
Alkaline Phosphatase: 89 IU/L (ref 39–117)
BUN/Creatinine Ratio: 26 (ref 12–28)
BUN: 25 mg/dL (ref 8–27)
Bilirubin Total: 0.2 mg/dL (ref 0.0–1.2)
CO2: 23 mmol/L (ref 20–29)
Calcium: 9.6 mg/dL (ref 8.7–10.3)
Chloride: 104 mmol/L (ref 96–106)
Creatinine, Ser: 0.95 mg/dL (ref 0.57–1.00)
GFR calc Af Amer: 66 mL/min/{1.73_m2} (ref >59)
GFR calc non Af Amer: 57 mL/min/{1.73_m2} — ABNORMAL LOW (ref >59)
Globulin, Total: 2 g/dL (ref 1.5–4.5)
Glucose: 219 mg/dL — ABNORMAL HIGH (ref 65–99)
Potassium: 4.3 mmol/L (ref 3.5–5.2)
Sodium: 140 mmol/L (ref 134–144)
Total Protein: 6.1 g/dL (ref 6.0–8.5)

## 2019-07-05 LAB — TSH: TSH: 1.8 u[IU]/mL (ref 0.450–4.500)

## 2019-07-09 ENCOUNTER — Telehealth: Payer: Self-pay

## 2019-07-09 NOTE — Telephone Encounter (Signed)
Left message for Lori Berger to call back.  (Pt's daughter in [place])  Thanks,   -Mickel Baas

## 2019-07-09 NOTE — Telephone Encounter (Signed)
-----   Message from Jerrol Banana., MD sent at 07/08/2019  9:20 AM EDT ----- Labs stable.

## 2019-07-09 NOTE — Telephone Encounter (Signed)
Can you also sign off on patient note so we can fax to her Skilled nursing facility along with the order needed to start physical therapy?   Thanks!

## 2019-07-12 DIAGNOSIS — R278 Other lack of coordination: Secondary | ICD-10-CM | POA: Diagnosis not present

## 2019-07-12 DIAGNOSIS — R262 Difficulty in walking, not elsewhere classified: Secondary | ICD-10-CM | POA: Diagnosis not present

## 2019-07-12 DIAGNOSIS — M6281 Muscle weakness (generalized): Secondary | ICD-10-CM | POA: Diagnosis not present

## 2019-07-12 DIAGNOSIS — U071 COVID-19: Secondary | ICD-10-CM | POA: Diagnosis not present

## 2019-07-16 DIAGNOSIS — M6281 Muscle weakness (generalized): Secondary | ICD-10-CM | POA: Diagnosis not present

## 2019-07-16 DIAGNOSIS — M25562 Pain in left knee: Secondary | ICD-10-CM | POA: Diagnosis not present

## 2019-07-16 DIAGNOSIS — R278 Other lack of coordination: Secondary | ICD-10-CM | POA: Diagnosis not present

## 2019-07-16 DIAGNOSIS — R262 Difficulty in walking, not elsewhere classified: Secondary | ICD-10-CM | POA: Diagnosis not present

## 2019-07-18 DIAGNOSIS — R262 Difficulty in walking, not elsewhere classified: Secondary | ICD-10-CM | POA: Diagnosis not present

## 2019-07-18 DIAGNOSIS — M6281 Muscle weakness (generalized): Secondary | ICD-10-CM | POA: Diagnosis not present

## 2019-07-18 DIAGNOSIS — R278 Other lack of coordination: Secondary | ICD-10-CM | POA: Diagnosis not present

## 2019-07-20 DIAGNOSIS — R278 Other lack of coordination: Secondary | ICD-10-CM | POA: Diagnosis not present

## 2019-07-20 DIAGNOSIS — M6281 Muscle weakness (generalized): Secondary | ICD-10-CM | POA: Diagnosis not present

## 2019-07-20 DIAGNOSIS — R262 Difficulty in walking, not elsewhere classified: Secondary | ICD-10-CM | POA: Diagnosis not present

## 2019-07-23 DIAGNOSIS — M6281 Muscle weakness (generalized): Secondary | ICD-10-CM | POA: Diagnosis not present

## 2019-07-23 DIAGNOSIS — R278 Other lack of coordination: Secondary | ICD-10-CM | POA: Diagnosis not present

## 2019-07-23 DIAGNOSIS — R262 Difficulty in walking, not elsewhere classified: Secondary | ICD-10-CM | POA: Diagnosis not present

## 2019-07-25 DIAGNOSIS — R262 Difficulty in walking, not elsewhere classified: Secondary | ICD-10-CM | POA: Diagnosis not present

## 2019-07-25 DIAGNOSIS — R278 Other lack of coordination: Secondary | ICD-10-CM | POA: Diagnosis not present

## 2019-07-25 DIAGNOSIS — M6281 Muscle weakness (generalized): Secondary | ICD-10-CM | POA: Diagnosis not present

## 2019-07-26 DIAGNOSIS — U071 COVID-19: Secondary | ICD-10-CM | POA: Diagnosis not present

## 2019-07-27 DIAGNOSIS — R262 Difficulty in walking, not elsewhere classified: Secondary | ICD-10-CM | POA: Diagnosis not present

## 2019-07-27 DIAGNOSIS — R278 Other lack of coordination: Secondary | ICD-10-CM | POA: Diagnosis not present

## 2019-07-27 DIAGNOSIS — M6281 Muscle weakness (generalized): Secondary | ICD-10-CM | POA: Diagnosis not present

## 2019-08-01 DIAGNOSIS — R262 Difficulty in walking, not elsewhere classified: Secondary | ICD-10-CM | POA: Diagnosis not present

## 2019-08-01 DIAGNOSIS — R278 Other lack of coordination: Secondary | ICD-10-CM | POA: Diagnosis not present

## 2019-08-01 DIAGNOSIS — M6281 Muscle weakness (generalized): Secondary | ICD-10-CM | POA: Diagnosis not present

## 2019-08-02 DIAGNOSIS — R262 Difficulty in walking, not elsewhere classified: Secondary | ICD-10-CM | POA: Diagnosis not present

## 2019-08-02 DIAGNOSIS — M6281 Muscle weakness (generalized): Secondary | ICD-10-CM | POA: Diagnosis not present

## 2019-08-02 DIAGNOSIS — R278 Other lack of coordination: Secondary | ICD-10-CM | POA: Diagnosis not present

## 2019-08-06 DIAGNOSIS — R278 Other lack of coordination: Secondary | ICD-10-CM | POA: Diagnosis not present

## 2019-08-06 DIAGNOSIS — R262 Difficulty in walking, not elsewhere classified: Secondary | ICD-10-CM | POA: Diagnosis not present

## 2019-08-06 DIAGNOSIS — M6281 Muscle weakness (generalized): Secondary | ICD-10-CM | POA: Diagnosis not present

## 2019-08-08 DIAGNOSIS — M6281 Muscle weakness (generalized): Secondary | ICD-10-CM | POA: Diagnosis not present

## 2019-08-08 DIAGNOSIS — R262 Difficulty in walking, not elsewhere classified: Secondary | ICD-10-CM | POA: Diagnosis not present

## 2019-08-08 DIAGNOSIS — R278 Other lack of coordination: Secondary | ICD-10-CM | POA: Diagnosis not present

## 2019-08-13 DIAGNOSIS — R278 Other lack of coordination: Secondary | ICD-10-CM | POA: Diagnosis not present

## 2019-08-13 DIAGNOSIS — M6281 Muscle weakness (generalized): Secondary | ICD-10-CM | POA: Diagnosis not present

## 2019-08-13 DIAGNOSIS — R262 Difficulty in walking, not elsewhere classified: Secondary | ICD-10-CM | POA: Diagnosis not present

## 2019-08-16 ENCOUNTER — Other Ambulatory Visit: Payer: Self-pay | Admitting: Family Medicine

## 2019-08-16 DIAGNOSIS — R278 Other lack of coordination: Secondary | ICD-10-CM | POA: Diagnosis not present

## 2019-08-16 DIAGNOSIS — R262 Difficulty in walking, not elsewhere classified: Secondary | ICD-10-CM | POA: Diagnosis not present

## 2019-08-16 DIAGNOSIS — M6281 Muscle weakness (generalized): Secondary | ICD-10-CM | POA: Diagnosis not present

## 2019-08-16 DIAGNOSIS — M25562 Pain in left knee: Secondary | ICD-10-CM | POA: Diagnosis not present

## 2019-08-21 DIAGNOSIS — R278 Other lack of coordination: Secondary | ICD-10-CM | POA: Diagnosis not present

## 2019-08-21 DIAGNOSIS — M6281 Muscle weakness (generalized): Secondary | ICD-10-CM | POA: Diagnosis not present

## 2019-08-21 DIAGNOSIS — R262 Difficulty in walking, not elsewhere classified: Secondary | ICD-10-CM | POA: Diagnosis not present

## 2019-08-23 DIAGNOSIS — R262 Difficulty in walking, not elsewhere classified: Secondary | ICD-10-CM | POA: Diagnosis not present

## 2019-08-23 DIAGNOSIS — R278 Other lack of coordination: Secondary | ICD-10-CM | POA: Diagnosis not present

## 2019-08-23 DIAGNOSIS — M6281 Muscle weakness (generalized): Secondary | ICD-10-CM | POA: Diagnosis not present

## 2019-08-28 ENCOUNTER — Ambulatory Visit: Payer: Self-pay | Admitting: Family Medicine

## 2019-09-13 DIAGNOSIS — U071 COVID-19: Secondary | ICD-10-CM | POA: Diagnosis not present

## 2019-09-14 DIAGNOSIS — U071 COVID-19: Secondary | ICD-10-CM | POA: Diagnosis not present

## 2019-09-19 ENCOUNTER — Telehealth (INDEPENDENT_AMBULATORY_CARE_PROVIDER_SITE_OTHER): Payer: Medicare Other | Admitting: Family Medicine

## 2019-09-19 DIAGNOSIS — R300 Dysuria: Secondary | ICD-10-CM

## 2019-09-19 DIAGNOSIS — R238 Other skin changes: Secondary | ICD-10-CM

## 2019-09-19 MED ORDER — NYSTATIN 100000 UNIT/GM EX CREA: 1.0000 "application " | TOPICAL_CREAM | Freq: Two times a day (BID) | CUTANEOUS | 0 refills | Status: DC

## 2019-09-19 MED ORDER — NYSTATIN 100000 UNIT/GM EX CREA: 1.0000 "application " | TOPICAL_CREAM | Freq: Two times a day (BID) | CUTANEOUS | 2 refills | Status: DC

## 2019-09-19 NOTE — Telephone Encounter (Signed)
From [facility] 

## 2019-09-19 NOTE — Telephone Encounter (Signed)
Pt is also raw under the rolls of her skin/ They states she probably needs some kind ointment and Pt will have to cancel appt due to covid breakout at facility / please advise

## 2019-09-19 NOTE — Telephone Encounter (Signed)
Linda with [name] returning call to office. She states patient has tested positive for covid 19. She is requesting call back.

## 2019-09-19 NOTE — Telephone Encounter (Signed)
Pt wet her self this morning and stated she couldn't get up and yesterday she laid in bed with no bottoms / facility is concerned she may have a UTI and wanted to get an order to check her urine/ please advise   Call [place] @ [phone]

## 2019-09-19 NOTE — Telephone Encounter (Signed)
OK for UA and C and S. Try Nystatin cream BID prn for skin. Are they covid testing her--I would.

## 2019-09-19 NOTE — Telephone Encounter (Signed)
Orders for the faciluity have been written and will be faxed per request to place in patients chart to [phone].

## 2019-09-19 NOTE — Telephone Encounter (Signed)
Patient is at [facility] where there was a potential outbreak of covid . I have spoken with the patients daughter and she will pick up the cream at [place] and take it to the facility. I have called the facility at [phone] and spoken with yvette and  informed them of the daughter bringing the cream to their facility and I will fax orders for the U/A and someone will drop it off at our facility. I also will send an order for them to get her tested for covid as well. She gave verbal understanding.

## 2019-09-19 NOTE — Telephone Encounter (Signed)
Please advise 

## 2019-09-19 NOTE — Progress Notes (Deleted)
       Patient: Lori Berger Female    DOB: 12/24/1940   78 y.o.   MRN: [id number] Visit Date: 09/19/2019  Today's Provider: Wilhemena Durie, MD   No chief complaint on file.  Subjective:     HPI  Allergies  Allergen Reactions  . Penicillins      Current Outpatient Medications:  .  amLODipine (NORVASC) 5 MG tablet, TAKE 1 TABLET BY MOUTH  DAILY, Disp: 90 tablet, Rfl: 3 .  aspirin EC 81 MG tablet, Take 1 tablet (81 mg total) by mouth daily., Disp: 90 tablet, Rfl: 1 .  Calcium Carb-Cholecalciferol (CALCIUM 600+D) 600-800 MG-UNIT TABS, Take 1 tablet by mouth daily., Disp: , Rfl:  .  Canagliflozin-metFORMIN HCl (INVOKAMET) 150-500 MG TABS, Take 1 tablet by mouth 2 (two) times daily., Disp: 180 tablet, Rfl: 3 .  clopidogrel (PLAVIX) 75 MG tablet, TAKE 1 TABLET BY MOUTH  DAILY, Disp: 90 tablet, Rfl: 3 .  glucose blood test strip, Check sugar daily, Disp: 100 each, Rfl: 3 .  ibuprofen (ADVIL) 200 MG tablet, Take 1 tablet (200 mg total) by mouth every 6 (six) hours as needed for moderate pain., Disp: 30 tablet, Rfl: 0 .  letrozole (FEMARA) 2.5 MG tablet, TAKE 1 TABLET BY MOUTH  DAILY, Disp: 90 tablet, Rfl: 3 .  lisinopril (ZESTRIL) 20 MG tablet, TAKE 1 TABLET BY MOUTH  DAILY, Disp: 90 tablet, Rfl: 3 .  simvastatin (ZOCOR) 10 MG tablet, TAKE 1 TABLET BY MOUTH AT  BEDTIME, Disp: 90 tablet, Rfl: 3  Review of Systems  Constitutional: Negative for appetite change, chills, fatigue and fever.  Respiratory: Negative for chest tightness and shortness of breath.   Cardiovascular: Negative for chest pain and palpitations.  Gastrointestinal: Negative for abdominal pain, nausea and vomiting.  Neurological: Negative for dizziness and weakness.    Social History   Tobacco Use  . Smoking status: Never Smoker  . Smokeless tobacco: Never Used  Substance Use Topics  . Alcohol use: No      Objective:   There were no vitals taken for this visit. There were no vitals filed for this  visit.There is no height or weight on file to calculate BMI.   Physical Exam   No results found for any visits on 09/20/19.     Assessment & Plan        Wilhemena Durie, MD  [facility]

## 2019-09-20 ENCOUNTER — Ambulatory Visit: Payer: Self-pay | Admitting: Family Medicine

## 2019-09-20 ENCOUNTER — Other Ambulatory Visit: Payer: Self-pay | Admitting: Family Medicine

## 2019-09-20 DIAGNOSIS — R300 Dysuria: Secondary | ICD-10-CM | POA: Diagnosis not present

## 2019-09-20 LAB — POCT URINALYSIS DIPSTICK
Glucose, UA: POSITIVE — AB
Ketones, UA: 40
Leukocytes, UA: NEGATIVE
Nitrite, UA: NEGATIVE
Protein, UA: POSITIVE — AB
Spec Grav, UA: 1.025 (ref 1.010–1.025)
Urobilinogen, UA: 0.2 E.U./dL
pH, UA: 6 (ref 5.0–8.0)

## 2019-09-20 NOTE — Telephone Encounter (Signed)
A Corrected Order has been faxed to the Facility.

## 2019-09-20 NOTE — Addendum Note (Signed)
Addended by: Judie Petit on: 09/20/2019 09:04 AM   Modules accepted: Orders

## 2019-09-20 NOTE — Telephone Encounter (Signed)
Called and spoke with Vaughan Basta at [facility] and she was informed that an order to collect the urine was faxed over yesterday. Their courier will drop it off once collected. She gave verbal understanding.

## 2019-09-20 NOTE — Telephone Encounter (Signed)
An order for U/A and Culture has been placed for when the sample is dropped off.

## 2019-09-20 NOTE — Telephone Encounter (Signed)
Living facility needs rx for nystatin cream (MYCOSTATIN Resent with the correct directions/  They received Rx that said apply two times daily/ PRN but they need it to either have as needed or 2 times daily with Drs signature/ please advise

## 2019-09-21 ENCOUNTER — Telehealth: Payer: Self-pay | Admitting: Family Medicine

## 2019-09-21 NOTE — Telephone Encounter (Signed)
Copied from [place] [id number]. Topic: General - Other >> Sep 21, 2019  1:27 PM Celene Kras wrote: Reason for CRM: Pts daughter in law called and is requesting to have a call back regarding pts UA. Please advise.

## 2019-09-21 NOTE — Telephone Encounter (Signed)
Patient's daughter-n-law advised urine culture is still pending.

## 2019-09-24 ENCOUNTER — Other Ambulatory Visit: Payer: Self-pay

## 2019-09-24 ENCOUNTER — Emergency Department: Payer: Medicare Other

## 2019-09-24 ENCOUNTER — Telehealth: Payer: Self-pay

## 2019-09-24 ENCOUNTER — Emergency Department
Admission: EM | Admit: 2019-09-24 | Discharge: 2019-09-24 | Disposition: A | Discharge status: Home / Self Care | Payer: Medicare Other | Source: Home / Self Care | Attending: Emergency Medicine | Admitting: Emergency Medicine

## 2019-09-24 ENCOUNTER — Encounter: Payer: Self-pay | Admitting: Emergency Medicine

## 2019-09-24 DIAGNOSIS — I2510 Atherosclerotic heart disease of native coronary artery without angina pectoris: Secondary | ICD-10-CM | POA: Diagnosis not present

## 2019-09-24 DIAGNOSIS — R0902 Hypoxemia: Secondary | ICD-10-CM | POA: Diagnosis not present

## 2019-09-24 DIAGNOSIS — Y92129 Unspecified place in nursing home as the place of occurrence of the external cause: Secondary | ICD-10-CM | POA: Insufficient documentation

## 2019-09-24 DIAGNOSIS — R262 Difficulty in walking, not elsewhere classified: Secondary | ICD-10-CM | POA: Diagnosis not present

## 2019-09-24 DIAGNOSIS — S3993XA Unspecified injury of pelvis, initial encounter: Secondary | ICD-10-CM | POA: Diagnosis not present

## 2019-09-24 DIAGNOSIS — R1311 Dysphagia, oral phase: Secondary | ICD-10-CM | POA: Diagnosis not present

## 2019-09-24 DIAGNOSIS — U071 COVID-19: Secondary | ICD-10-CM | POA: Diagnosis not present

## 2019-09-24 DIAGNOSIS — Z8542 Personal history of malignant neoplasm of other parts of uterus: Secondary | ICD-10-CM | POA: Insufficient documentation

## 2019-09-24 DIAGNOSIS — M6281 Muscle weakness (generalized): Secondary | ICD-10-CM | POA: Diagnosis not present

## 2019-09-24 DIAGNOSIS — R279 Unspecified lack of coordination: Secondary | ICD-10-CM | POA: Diagnosis not present

## 2019-09-24 DIAGNOSIS — W1830XA Fall on same level, unspecified, initial encounter: Secondary | ICD-10-CM | POA: Insufficient documentation

## 2019-09-24 DIAGNOSIS — C50911 Malignant neoplasm of unspecified site of right female breast: Secondary | ICD-10-CM | POA: Diagnosis not present

## 2019-09-24 DIAGNOSIS — E46 Unspecified protein-calorie malnutrition: Secondary | ICD-10-CM | POA: Diagnosis not present

## 2019-09-24 DIAGNOSIS — W19XXXA Unspecified fall, initial encounter: Secondary | ICD-10-CM | POA: Diagnosis not present

## 2019-09-24 DIAGNOSIS — J1289 Other viral pneumonia: Secondary | ICD-10-CM | POA: Diagnosis not present

## 2019-09-24 DIAGNOSIS — Z743 Need for continuous supervision: Secondary | ICD-10-CM | POA: Diagnosis not present

## 2019-09-24 DIAGNOSIS — Y999 Unspecified external cause status: Secondary | ICD-10-CM | POA: Insufficient documentation

## 2019-09-24 DIAGNOSIS — E785 Hyperlipidemia, unspecified: Secondary | ICD-10-CM | POA: Diagnosis not present

## 2019-09-24 DIAGNOSIS — R42 Dizziness and giddiness: Secondary | ICD-10-CM | POA: Diagnosis not present

## 2019-09-24 DIAGNOSIS — E860 Dehydration: Secondary | ICD-10-CM | POA: Insufficient documentation

## 2019-09-24 DIAGNOSIS — Z66 Do not resuscitate: Secondary | ICD-10-CM | POA: Diagnosis not present

## 2019-09-24 DIAGNOSIS — N390 Urinary tract infection, site not specified: Secondary | ICD-10-CM

## 2019-09-24 DIAGNOSIS — S299XXA Unspecified injury of thorax, initial encounter: Secondary | ICD-10-CM | POA: Diagnosis not present

## 2019-09-24 DIAGNOSIS — Z7982 Long term (current) use of aspirin: Secondary | ICD-10-CM | POA: Insufficient documentation

## 2019-09-24 DIAGNOSIS — Z9012 Acquired absence of left breast and nipple: Secondary | ICD-10-CM | POA: Diagnosis not present

## 2019-09-24 DIAGNOSIS — Z853 Personal history of malignant neoplasm of breast: Secondary | ICD-10-CM | POA: Insufficient documentation

## 2019-09-24 DIAGNOSIS — Z751 Person awaiting admission to adequate facility elsewhere: Secondary | ICD-10-CM | POA: Diagnosis not present

## 2019-09-24 DIAGNOSIS — I639 Cerebral infarction, unspecified: Secondary | ICD-10-CM | POA: Diagnosis not present

## 2019-09-24 DIAGNOSIS — Z85828 Personal history of other malignant neoplasm of skin: Secondary | ICD-10-CM | POA: Insufficient documentation

## 2019-09-24 DIAGNOSIS — M25551 Pain in right hip: Secondary | ICD-10-CM | POA: Insufficient documentation

## 2019-09-24 DIAGNOSIS — R52 Pain, unspecified: Secondary | ICD-10-CM | POA: Diagnosis not present

## 2019-09-24 DIAGNOSIS — J069 Acute upper respiratory infection, unspecified: Secondary | ICD-10-CM | POA: Diagnosis not present

## 2019-09-24 DIAGNOSIS — M1990 Unspecified osteoarthritis, unspecified site: Secondary | ICD-10-CM | POA: Diagnosis not present

## 2019-09-24 DIAGNOSIS — H9190 Unspecified hearing loss, unspecified ear: Secondary | ICD-10-CM | POA: Diagnosis not present

## 2019-09-24 DIAGNOSIS — I959 Hypotension, unspecified: Secondary | ICD-10-CM | POA: Diagnosis not present

## 2019-09-24 DIAGNOSIS — Z8051 Family history of malignant neoplasm of kidney: Secondary | ICD-10-CM | POA: Diagnosis not present

## 2019-09-24 DIAGNOSIS — J9601 Acute respiratory failure with hypoxia: Secondary | ICD-10-CM | POA: Diagnosis not present

## 2019-09-24 DIAGNOSIS — Z8673 Personal history of transient ischemic attack (TIA), and cerebral infarction without residual deficits: Secondary | ICD-10-CM | POA: Insufficient documentation

## 2019-09-24 DIAGNOSIS — S79911A Unspecified injury of right hip, initial encounter: Secondary | ICD-10-CM | POA: Diagnosis not present

## 2019-09-24 DIAGNOSIS — I10 Essential (primary) hypertension: Secondary | ICD-10-CM | POA: Insufficient documentation

## 2019-09-24 DIAGNOSIS — M542 Cervicalgia: Secondary | ICD-10-CM | POA: Diagnosis not present

## 2019-09-24 DIAGNOSIS — J189 Pneumonia, unspecified organism: Secondary | ICD-10-CM | POA: Diagnosis not present

## 2019-09-24 DIAGNOSIS — L249 Irritant contact dermatitis, unspecified cause: Secondary | ICD-10-CM | POA: Diagnosis not present

## 2019-09-24 DIAGNOSIS — Z9181 History of falling: Secondary | ICD-10-CM | POA: Diagnosis not present

## 2019-09-24 DIAGNOSIS — Z7902 Long term (current) use of antithrombotics/antiplatelets: Secondary | ICD-10-CM | POA: Insufficient documentation

## 2019-09-24 DIAGNOSIS — G8929 Other chronic pain: Secondary | ICD-10-CM | POA: Diagnosis not present

## 2019-09-24 DIAGNOSIS — E119 Type 2 diabetes mellitus without complications: Secondary | ICD-10-CM | POA: Insufficient documentation

## 2019-09-24 DIAGNOSIS — Z79899 Other long term (current) drug therapy: Secondary | ICD-10-CM | POA: Insufficient documentation

## 2019-09-24 DIAGNOSIS — Z7984 Long term (current) use of oral hypoglycemic drugs: Secondary | ICD-10-CM | POA: Diagnosis not present

## 2019-09-24 DIAGNOSIS — I4510 Unspecified right bundle-branch block: Secondary | ICD-10-CM | POA: Diagnosis not present

## 2019-09-24 DIAGNOSIS — Y9389 Activity, other specified: Secondary | ICD-10-CM | POA: Insufficient documentation

## 2019-09-24 DIAGNOSIS — Z823 Family history of stroke: Secondary | ICD-10-CM | POA: Diagnosis not present

## 2019-09-24 LAB — COMPREHENSIVE METABOLIC PANEL
ALT: 18 U/L (ref 0–44)
AST: 34 U/L (ref 15–41)
Albumin: 3.6 g/dL (ref 3.5–5.0)
Alkaline Phosphatase: 56 U/L (ref 38–126)
Anion gap: 15 (ref 5–15)
BUN: 42 mg/dL — ABNORMAL HIGH (ref 8–23)
CO2: 20 mmol/L — ABNORMAL LOW (ref 22–32)
Calcium: 9.1 mg/dL (ref 8.9–10.3)
Chloride: 102 mmol/L (ref 98–111)
Creatinine, Ser: 1.03 mg/dL — ABNORMAL HIGH (ref 0.44–1.00)
GFR calc Af Amer: 59 mL/min — ABNORMAL LOW (ref >60)
GFR calc non Af Amer: 51 mL/min — ABNORMAL LOW (ref >60)
Glucose, Bld: 173 mg/dL — ABNORMAL HIGH (ref 70–99)
Potassium: 3.9 mmol/L (ref 3.5–5.1)
Sodium: 137 mmol/L (ref 135–145)
Total Bilirubin: 0.8 mg/dL (ref 0.3–1.2)
Total Protein: 7 g/dL (ref 6.5–8.1)

## 2019-09-24 LAB — CBC
HCT: 43.8 % (ref 36.0–46.0)
Hemoglobin: 14.5 g/dL (ref 12.0–15.0)
MCH: 30 pg (ref 26.0–34.0)
MCHC: 33.1 g/dL (ref 30.0–36.0)
MCV: 90.7 fL (ref 80.0–100.0)
Platelets: 188 10*3/uL (ref 150–400)
RBC: 4.83 MIL/uL (ref 3.87–5.11)
RDW: 12.1 % (ref 11.5–15.5)
WBC: 6.7 10*3/uL (ref 4.0–10.5)
nRBC: 0 % (ref 0.0–0.2)

## 2019-09-24 LAB — URINE CULTURE

## 2019-09-24 MED ORDER — SODIUM CHLORIDE 0.9 % IV BOLUS
500.0000 mL | Freq: Once | INTRAVENOUS | Status: AC
  Administered 2019-09-24: 500 mL via INTRAVENOUS

## 2019-09-24 MED ORDER — SULFAMETHOXAZOLE-TRIMETHOPRIM 400-80 MG PO TABS: 1.0000 | ORAL_TABLET | Freq: Two times a day (BID) | ORAL | 0 refills | Status: DC

## 2019-09-24 MED ORDER — ACETAMINOPHEN 325 MG PO TABS
650.0000 mg | ORAL_TABLET | Freq: Once | ORAL | Status: AC
  Administered 2019-09-24: 650 mg via ORAL
  Filled 2019-09-24: qty 2

## 2019-09-24 NOTE — Telephone Encounter (Signed)
I see the order from Dr. Rosanna Randy. I will sign hard script and we can fax to facility.

## 2019-09-24 NOTE — Telephone Encounter (Signed)
Called and spoke with the patients son Ed about her positive urine culture. He informed me that the patient was taken to the hospital early this morning due to not being able to keep fluids down and being weak. He also stated that she had fallen this morning as well. He gave verbal understanding about the results.

## 2019-09-24 NOTE — ED Notes (Signed)
[facility]  CALLED  FOR  TRANSPORT 

## 2019-09-24 NOTE — Discharge Instructions (Addendum)
Patient was given IV fluids for lab demonstrated dehydration, no evidence of bony injury, x-rays reassuring

## 2019-09-24 NOTE — ED Provider Notes (Signed)
[facility] Emergency Department Provider Note   ____________________________________________    I have reviewed the triage vital signs and the nursing notes.   HISTORY  Chief Complaint Fall     HPI Lori Berger is a 78 y.o. female with history as noted below who presents from [facility] after an unwitnessed fall.  Patient reports she was going to the bathroom but "did not make it ".  She does not have a great memory of the fall.  She denies headache.  Has chronic neck pain, unchanged.  No chest wall pain.  No extremity injuries.  Describes some pain in her pelvis.  Does have positive Covid diagnosis, x1 week.  No shortness of breath.  Does feel dehydrated, her mouth is dry  Past Medical History:  Diagnosis Date  . Arthritis   . BCC (basal cell carcinoma)   . Breast cancer ([place]) 1990   left 1990  . Breast cancer ([place]) 11/11/2015   Right, T2 (2.4 cm(, N0; positive deep margin, ER: 100%, PR: 60%; Her 2 neu not amplified, Mammoprint: low risk  . Diabetes mellitus without complication ([place])   . Endometrial cancer ([place])   . Hyperlipidemia   . Hypertension   . Major depressive disorder   . PONV (postoperative nausea and vomiting)     Patient Active Problem List   Diagnosis Date Noted  . Stroke ([place]) 01/02/2017  . Acute lower UTI 01/02/2017  . 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 Personal history of malignant neoplasm of breast 06/29/2016  . Malignant neoplasm of right female breast ([place]) 03/30/2016  . Loss of weight 03/30/2016  . Faintness 01/26/2016  . Osteopenia 01/26/2016  . Breast cancer, female, right 09/26/2015  . Diabetes mellitus, type 2 ([place]) 02/07/2015  . Malignant neoplasm of corpus uteri, except isthmus ([place]) 02/07/2015  . Polyp of corpus uteri 02/07/2015  . Essential (primary) hypertension 02/07/2015  . Need for prophylactic hormone replacement therapy (postmenopausal) 02/07/2015  . Decreased potassium in the blood 02/07/2015   . 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 Malaise and fatigue 02/07/2015  . Combined fat and carbohydrate induced hyperlipemia 02/07/2015  . Adiposity 02/07/2015  . Arthritis, degenerative 02/07/2015  . Allergic rhinitis 02/07/2015  . Postmenopausal bleeding 02/07/2015  . Psychosis ([place]) 02/07/2015  . Inflammation of sacroiliac joint ([place]) 02/07/2015    Past Surgical History:  Procedure Laterality Date  . ABDOMINAL HYSTERECTOMY    . MASTECTOMY Left    1990  . SENTINEL NODE BIOPSY Right 11/11/2015   Procedure: SENTINEL NODE BIOPSY;  Surgeon:  Bellow, MD;  Location: [facility] ORS;  Service: General;  Laterality: Right;  . SIMPLE MASTECTOMY WITH AXILLARY SENTINEL NODE BIOPSY Right 11/11/2015   Procedure: SIMPLE MASTECTOMY;  Surgeon:  Bellow, MD;  Location: [facility] ORS;  Service: General;  Laterality: Right;    Prior to Admission medications   Medication Sig Start Date End Date Taking? Authorizing Provider  amLODipine (NORVASC) 5 MG tablet TAKE 1 TABLET BY MOUTH  DAILY Patient taking differently: Take 5 mg by mouth daily.  06/26/19  Yes Jerrol Banana., MD  aspirin EC 81 MG tablet Take 1 tablet (81 mg total) by mouth daily. 06/28/19  Yes Jerrol Banana., MD  Calcium-Vitamin D-Vitamin K [phone] MG-UNT-MCG TABS Take 1 tablet by mouth daily.   Yes [provider]  Canagliflozin-metFORMIN HCl (INVOKAMET) 150-500 MG TABS Take 1 tablet by mouth 2 (two) times daily. 12/19/18  Yes Jerrol Banana., MD  clopidogrel (PLAVIX) 75 MG tablet TAKE 1 TABLET BY MOUTH  DAILY Patient taking differently: Take 75 mg by mouth daily.  06/26/19  Yes Jerrol Banana., MD  glucose blood test strip Check sugar daily 04/04/17  Yes Carmon Ginsberg, PA  ibuprofen (ADVIL) 200 MG tablet Take 1 tablet (200 mg total) by mouth every 6 (six) hours as needed for moderate pain. 01/06/17  Yes Max Sane, MD  letrozole ([place]) 2.5 MG tablet TAKE 1 TABLET BY MOUTH  DAILY Patient taking  differently: Take 2.5 mg by mouth daily.  08/16/19  Yes Jerrol Banana., MD  lisinopril (ZESTRIL) 20 MG tablet TAKE 1 TABLET BY MOUTH  DAILY Patient taking differently: Take 20 mg by mouth daily.  04/05/19  Yes Jerrol Banana., MD  nystatin cream (MYCOSTATIN) Apply 1 application topically 2 (two) times daily. 09/19/19  Yes Jerrol Banana., MD  simvastatin (ZOCOR) 10 MG tablet TAKE 1 TABLET BY MOUTH AT  BEDTIME Patient taking differently: Take 10 mg by mouth at bedtime.  08/16/19  Yes Jerrol Banana., MD     Allergies Penicillins  Family History  Problem Relation Age of Onset  . Heart disease Mother        Fatal MI age 17  . Stroke Father        fatal age 90  . Cancer Sister        Colon  . Cancer Brother        Died from Kidney cancer  . Breast cancer Neg Hx     Social History Social History   Tobacco Use  . Smoking status: Never Smoker  . Smokeless tobacco: Never Used  Substance Use Topics  . Alcohol use: No  . Drug use: No    Review of Systems  Constitutional: Occasional chills Eyes: No visual changes.  ENT: Mouth is dry Cardiovascular: Denies chest pain. Respiratory: Denies shortness of breath. Gastrointestinal: No abdominal pain.  No nausea, no vomiting.   Genitourinary: Negative for dysuria. Musculoskeletal: No back pain no neck pain, some pelvic discomfort Skin: Negative for rash. Neurological: Negative for headaches, no focal weakness   ____________________________________________   PHYSICAL EXAM:  VITAL SIGNS: ED Triage Vitals  Enc Vitals Group     BP 09/24/19 0909 (!) 109/59     Pulse Rate 09/24/19 0909 95     Resp 09/24/19 0909 18     Temp 09/24/19 0909 98.4 F (36.9 C)     Temp Source 09/24/19 0909 Oral     SpO2 09/24/19 0909 98 %     Weight 09/24/19 0910 74.8 kg (165 lb)     Height 09/24/19 0910 1.626 m (5\' 4" )     Head Circumference --      Peak Flow --      Pain Score 09/24/19 0909 8     Pain Loc --       Pain Edu? --      Excl. in [place]? --     Constitutional: Alert, oriented Eyes: Conjunctivae are normal.  Head: Atraumatic.  No hematoma Nose: No congestion/rhinnorhea.  No swelling or epistaxis Mouth/Throat: Mucous membranes are moist.   Neck:  Painless ROM, no vertebral terms palpation, no pain with axial load Cardiovascular: Normal rate, regular rhythm. Grossly normal heart sounds.  Good peripheral circulation.  No chest wall tenderness palpation Respiratory: Normal respiratory effort.  No retractions.  Gastrointestinal: Soft and nontender. No distention.   Musculoskeletal: Moves all extremities well without pain,  discomfort described in the pelvis however no pain with axial load on both hips..  No vertebral tenderness palpation Neurologic:  Normal speech and language. No gross focal neurologic deficits are appreciated.  Skin:  Skin is warm, dry and intact. No rash noted. Psychiatric: Mood and affect are normal. Speech and behavior are normal.  ____________________________________________   LABS (all labs ordered are listed, but only abnormal results are displayed)  Labs Reviewed  COMPREHENSIVE METABOLIC PANEL - Abnormal; Notable for the following components:      Result Value   CO2 20 (*)    Glucose, Bld 173 (*)    BUN 42 (*)    Creatinine, Ser 1.03 (*)    GFR calc non Af Amer 51 (*)    GFR calc Af Amer 59 (*)    All other components within normal limits  CBC   ____________________________________________  EKG  ED ECG REPORT I, Lavonia Drafts, the attending physician, personally viewed and interpreted this ECG.  Date: 09/24/2019  Rhythm: normal sinus rhythm QRS Axis: normal Intervals: normal ST/T Wave abnormalities: normal Narrative Interpretation: no evidence of acute ischemia  ____________________________________________  RADIOLOGY  Chest x-ray, pelvic x-ray ____________________________________________   PROCEDURES  Procedure(s) performed:  No  Procedures   Critical Care performed: No ____________________________________________   INITIAL IMPRESSION / ASSESSMENT AND PLAN / ED COURSE  Pertinent labs & imaging results that were available during my care of the patient were reviewed by me and considered in my medical decision making (see chart for details).  Patient presents after fall, suspect dehydration given exam.  Overall well-appearing vital signs reassuring, will x-ray pelvis given mild complaint of discomfort there, otherwise no evidence of bony injury.  Will give 500 cc bolus of fluids  X-rays are reassuring, no evidence of bony injury.  Lab work consistent with dehydration, has received IV fluids, vital signs reassuring, appropriate for discharge at this time    ____________________________________________   FINAL CLINICAL IMPRESSION(S) / ED DIAGNOSES  Final diagnoses:  Fall, initial encounter  Dehydration        Note:  This document was prepared using Dragon voice recognition software and may include unintentional dictation errors.   Lavonia Drafts, MD 09/24/19 1308

## 2019-09-24 NOTE — Telephone Encounter (Signed)
I have spoken with Vaughan Basta at [facility] and informed her that the patient was positive for a UTI and a prescription will be sent into her pharmacy for someone to pick up to bring to their facility. The patient did go to the hospital this morning but is scheduled to return back to the nursing facility today. An order has been written and faxed to the facility for administration.

## 2019-09-24 NOTE — Telephone Encounter (Signed)
Please review I only see preliminary from 09/20/19. KW Copied from [place] [id number]. Topic: General - Call Back - No Documentation >> Sep 24, 2019 11:35 AM Erick Blinks wrote: Reason for CRM: Pt's caregiver Vaughan Basta called to request lab results for urine culture, please advise  Best contact: [phone]

## 2019-09-24 NOTE — Telephone Encounter (Signed)
Pt daughter in law Lori Berger is calling and would like rx for UTI pt son cancelled the requesting. [facility]

## 2019-09-24 NOTE — ED Notes (Addendum)
Pt resting, drank a cup of apple juice, tolerated well. Denies complaints at this time. Waiting for EMS transport back to facility.

## 2019-09-24 NOTE — ED Notes (Signed)
Warm blanket given, purewik cath applied and explained to pt, pt verbalized understanding. Wet pull up removed and pt's skinned cleaned and dried. Pt tolerated well. C/o pain to left knee-will make dr Corky Downs aware. Call bell in reach.

## 2019-09-24 NOTE — ED Notes (Signed)
Called [facility] to give report, left a message-will call back.

## 2019-09-24 NOTE — ED Notes (Signed)
Pt resting in bed with eyes closed, rise and fall of chest noted. Call light in reach.

## 2019-09-24 NOTE — Telephone Encounter (Signed)
Patient's sone was advised. See other note.

## 2019-09-24 NOTE — Telephone Encounter (Signed)
Pts daughter in law called and is requesting to have the pts UA results. She also states that the pt fell and is not able to be admitted into the hospital because she tested positive for covid.  Please advise.

## 2019-09-24 NOTE — Telephone Encounter (Signed)
-----   Message from Jerrol Banana., MD sent at 09/23/2019  8:49 AM EST ----- Patient has Covid.  Treat for possible UTI with Septra regular dose twice daily for 3 days

## 2019-09-24 NOTE — ED Triage Notes (Signed)
Pt here via EMS with c/o fall this am at nursing home. States she was walking to the bathroom with her walker, and doesn't know what happened, was unwitnessed fall, states "everything hurts now." Denies LOC, denies hitting her head or blood thinner use. Alert and oriented. Pt tested positive for covid a week ago.

## 2019-09-24 NOTE — Telephone Encounter (Signed)
Order was signed and faxed per [facility].

## 2019-09-24 NOTE — ED Notes (Signed)
Pt resting in bed, denies complaints, call light in reach.

## 2019-09-25 ENCOUNTER — Emergency Department: Payer: Medicare Other

## 2019-09-25 ENCOUNTER — Other Ambulatory Visit: Payer: Self-pay

## 2019-09-25 ENCOUNTER — Inpatient Hospital Stay
Admission: EM | Admit: 2019-09-25 | Discharge: 2019-10-03 | DRG: 177 | Disposition: A | Discharge status: Skilled Nursing Facility | Payer: Medicare Other | Source: Skilled Nursing Facility | Attending: Family Medicine | Admitting: Family Medicine

## 2019-09-25 ENCOUNTER — Encounter: Payer: Self-pay | Admitting: Family Medicine

## 2019-09-25 DIAGNOSIS — Z9181 History of falling: Secondary | ICD-10-CM | POA: Diagnosis not present

## 2019-09-25 DIAGNOSIS — Z9012 Acquired absence of left breast and nipple: Secondary | ICD-10-CM

## 2019-09-25 DIAGNOSIS — E860 Dehydration: Secondary | ICD-10-CM | POA: Diagnosis present

## 2019-09-25 DIAGNOSIS — H9190 Unspecified hearing loss, unspecified ear: Secondary | ICD-10-CM | POA: Diagnosis present

## 2019-09-25 DIAGNOSIS — Z7902 Long term (current) use of antithrombotics/antiplatelets: Secondary | ICD-10-CM

## 2019-09-25 DIAGNOSIS — I10 Essential (primary) hypertension: Secondary | ICD-10-CM | POA: Diagnosis present

## 2019-09-25 DIAGNOSIS — Z79818 Long term (current) use of other agents affecting estrogen receptors and estrogen levels: Secondary | ICD-10-CM | POA: Diagnosis not present

## 2019-09-25 DIAGNOSIS — E119 Type 2 diabetes mellitus without complications: Secondary | ICD-10-CM | POA: Diagnosis not present

## 2019-09-25 DIAGNOSIS — N390 Urinary tract infection, site not specified: Secondary | ICD-10-CM | POA: Diagnosis present

## 2019-09-25 DIAGNOSIS — J1289 Other viral pneumonia: Secondary | ICD-10-CM | POA: Diagnosis not present

## 2019-09-25 DIAGNOSIS — Z66 Do not resuscitate: Secondary | ICD-10-CM | POA: Diagnosis present

## 2019-09-25 DIAGNOSIS — Z7982 Long term (current) use of aspirin: Secondary | ICD-10-CM

## 2019-09-25 DIAGNOSIS — I2510 Atherosclerotic heart disease of native coronary artery without angina pectoris: Secondary | ICD-10-CM | POA: Diagnosis present

## 2019-09-25 DIAGNOSIS — F331 Major depressive disorder, recurrent, moderate: Secondary | ICD-10-CM | POA: Diagnosis present

## 2019-09-25 DIAGNOSIS — U071 COVID-19: Secondary | ICD-10-CM | POA: Diagnosis not present

## 2019-09-25 DIAGNOSIS — Z85828 Personal history of other malignant neoplasm of skin: Secondary | ICD-10-CM

## 2019-09-25 DIAGNOSIS — Z8542 Personal history of malignant neoplasm of other parts of uterus: Secondary | ICD-10-CM | POA: Diagnosis not present

## 2019-09-25 DIAGNOSIS — G8929 Other chronic pain: Secondary | ICD-10-CM | POA: Diagnosis present

## 2019-09-25 DIAGNOSIS — Z823 Family history of stroke: Secondary | ICD-10-CM

## 2019-09-25 DIAGNOSIS — Z7984 Long term (current) use of oral hypoglycemic drugs: Secondary | ICD-10-CM | POA: Diagnosis not present

## 2019-09-25 DIAGNOSIS — Z751 Person awaiting admission to adequate facility elsewhere: Secondary | ICD-10-CM

## 2019-09-25 DIAGNOSIS — E785 Hyperlipidemia, unspecified: Secondary | ICD-10-CM | POA: Diagnosis present

## 2019-09-25 DIAGNOSIS — M542 Cervicalgia: Secondary | ICD-10-CM | POA: Diagnosis present

## 2019-09-25 DIAGNOSIS — J069 Acute upper respiratory infection, unspecified: Secondary | ICD-10-CM

## 2019-09-25 DIAGNOSIS — Z853 Personal history of malignant neoplasm of breast: Secondary | ICD-10-CM

## 2019-09-25 DIAGNOSIS — Z8051 Family history of malignant neoplasm of kidney: Secondary | ICD-10-CM

## 2019-09-25 DIAGNOSIS — J1282 Pneumonia due to coronavirus disease 2019: Secondary | ICD-10-CM | POA: Diagnosis present

## 2019-09-25 DIAGNOSIS — Z8249 Family history of ischemic heart disease and other diseases of the circulatory system: Secondary | ICD-10-CM

## 2019-09-25 DIAGNOSIS — J9601 Acute respiratory failure with hypoxia: Secondary | ICD-10-CM | POA: Diagnosis present

## 2019-09-25 LAB — CBC WITH DIFFERENTIAL/PLATELET
Abs Immature Granulocytes: 0.04 10*3/uL (ref 0.00–0.07)
Basophils Absolute: 0 10*3/uL (ref 0.0–0.1)
Basophils Relative: 0 %
Eosinophils Absolute: 0 10*3/uL (ref 0.0–0.5)
Eosinophils Relative: 0 %
HCT: 43.8 % (ref 36.0–46.0)
Hemoglobin: 14.6 g/dL (ref 12.0–15.0)
Immature Granulocytes: 0 %
Lymphocytes Relative: 11 %
Lymphs Abs: 1.3 10*3/uL (ref 0.7–4.0)
MCH: 30.7 pg (ref 26.0–34.0)
MCHC: 33.3 g/dL (ref 30.0–36.0)
MCV: 92 fL (ref 80.0–100.0)
Monocytes Absolute: 0.5 10*3/uL (ref 0.1–1.0)
Monocytes Relative: 5 %
Neutro Abs: 9.3 10*3/uL — ABNORMAL HIGH (ref 1.7–7.7)
Neutrophils Relative %: 84 %
Platelets: 258 10*3/uL (ref 150–400)
RBC: 4.76 MIL/uL (ref 3.87–5.11)
RDW: 12.2 % (ref 11.5–15.5)
WBC: 11.2 10*3/uL — ABNORMAL HIGH (ref 4.0–10.5)
nRBC: 0 % (ref 0.0–0.2)

## 2019-09-25 LAB — FIBRINOGEN: Fibrinogen: 633 mg/dL — ABNORMAL HIGH (ref 210–475)

## 2019-09-25 LAB — COMPREHENSIVE METABOLIC PANEL
ALT: 16 U/L (ref 0–44)
AST: 32 U/L (ref 15–41)
Albumin: 3.7 g/dL (ref 3.5–5.0)
Alkaline Phosphatase: 68 U/L (ref 38–126)
Anion gap: 15 (ref 5–15)
BUN: 38 mg/dL — ABNORMAL HIGH (ref 8–23)
CO2: 18 mmol/L — ABNORMAL LOW (ref 22–32)
Calcium: 9.1 mg/dL (ref 8.9–10.3)
Chloride: 104 mmol/L (ref 98–111)
Creatinine, Ser: 1.06 mg/dL — ABNORMAL HIGH (ref 0.44–1.00)
GFR calc Af Amer: 57 mL/min — ABNORMAL LOW (ref >60)
GFR calc non Af Amer: 50 mL/min — ABNORMAL LOW (ref >60)
Glucose, Bld: 202 mg/dL — ABNORMAL HIGH (ref 70–99)
Potassium: 4 mmol/L (ref 3.5–5.1)
Sodium: 137 mmol/L (ref 135–145)
Total Bilirubin: 1 mg/dL (ref 0.3–1.2)
Total Protein: 7.2 g/dL (ref 6.5–8.1)

## 2019-09-25 LAB — GLUCOSE, CAPILLARY
Glucose-Capillary: 192 mg/dL — ABNORMAL HIGH (ref 70–99)
Glucose-Capillary: 202 mg/dL — ABNORMAL HIGH (ref 70–99)

## 2019-09-25 LAB — ABO/RH: ABO/RH(D): B POS

## 2019-09-25 LAB — BRAIN NATRIURETIC PEPTIDE: B Natriuretic Peptide: 31 pg/mL (ref 0.0–100.0)

## 2019-09-25 LAB — HEMOGLOBIN A1C
Hgb A1c MFr Bld: 7.1 % — ABNORMAL HIGH (ref 4.8–5.6)
Mean Plasma Glucose: 157.07 mg/dL

## 2019-09-25 LAB — TROPONIN I (HIGH SENSITIVITY)
Troponin I (High Sensitivity): 35 ng/L — ABNORMAL HIGH (ref <18)
Troponin I (High Sensitivity): 30 ng/L — ABNORMAL HIGH (ref <18)

## 2019-09-25 LAB — LACTATE DEHYDROGENASE: LDH: 293 U/L — ABNORMAL HIGH (ref 98–192)

## 2019-09-25 LAB — C-REACTIVE PROTEIN: CRP: 12.5 mg/dL — ABNORMAL HIGH (ref <1.0)

## 2019-09-25 LAB — LACTIC ACID, PLASMA
Lactic Acid, Venous: 1.8 mmol/L (ref 0.5–1.9)
Lactic Acid, Venous: 1.6 mmol/L (ref 0.5–1.9)

## 2019-09-25 LAB — FERRITIN: Ferritin: 459 ng/mL — ABNORMAL HIGH (ref 11–307)

## 2019-09-25 LAB — PROCALCITONIN: Procalcitonin: 0.14 ng/mL

## 2019-09-25 LAB — FIBRIN DERIVATIVES D-DIMER (ARMC ONLY): Fibrin derivatives D-dimer (ARMC): 3926.6 ng/mL (FEU) — ABNORMAL HIGH (ref 0.00–499.00)

## 2019-09-25 MED ORDER — HYDROCOD POLST-CPM POLST ER 10-8 MG/5ML PO SUER: 5.0000 mL | Freq: Two times a day (BID) | ORAL | Status: DC | PRN

## 2019-09-25 MED ORDER — DEXAMETHASONE SODIUM PHOSPHATE 10 MG/ML IJ SOLN
8.0000 mg | Freq: Once | INTRAMUSCULAR | Status: AC
  Administered 2019-09-25: 8 mg via INTRAVENOUS
  Filled 2019-09-25: qty 1

## 2019-09-25 MED ORDER — DEXAMETHASONE SODIUM PHOSPHATE 10 MG/ML IJ SOLN: 6.0000 mg | INTRAMUSCULAR | Status: DC

## 2019-09-25 MED ORDER — TRAZODONE HCL 50 MG PO TABS
25.0000 mg | ORAL_TABLET | Freq: Every evening | ORAL | Status: DC | PRN
  Administered 2019-09-26 – 2019-09-29 (×3): 25 mg via ORAL
  Filled 2019-09-25 (×3): qty 1

## 2019-09-25 MED ORDER — ZINC SULFATE 220 (50 ZN) MG PO CAPS
220.0000 mg | ORAL_CAPSULE | Freq: Every day | ORAL | Status: DC
  Administered 2019-09-25 – 2019-10-03 (×9): 220 mg via ORAL
  Filled 2019-09-25 (×9): qty 1

## 2019-09-25 MED ORDER — BISACODYL 5 MG PO TBEC: 5.0000 mg | DELAYED_RELEASE_TABLET | Freq: Every day | ORAL | Status: DC | PRN

## 2019-09-25 MED ORDER — INSULIN ASPART 100 UNIT/ML ~~LOC~~ SOLN
0.0000 [IU] | Freq: Three times a day (TID) | SUBCUTANEOUS | Status: DC
  Administered 2019-09-26 (×2): 2 [IU] via SUBCUTANEOUS
  Administered 2019-09-26: 3 [IU] via SUBCUTANEOUS
  Filled 2019-09-25 (×3): qty 1

## 2019-09-25 MED ORDER — ASPIRIN EC 81 MG PO TBEC
81.0000 mg | DELAYED_RELEASE_TABLET | Freq: Every day | ORAL | Status: DC
  Administered 2019-09-26 – 2019-10-03 (×9): 81 mg via ORAL
  Filled 2019-09-25 (×9): qty 1

## 2019-09-25 MED ORDER — NYSTATIN 100000 UNIT/GM EX CREA
1.0000 "application " | TOPICAL_CREAM | Freq: Two times a day (BID) | CUTANEOUS | Status: DC
  Administered 2019-09-26 – 2019-10-03 (×14): 1 via TOPICAL
  Filled 2019-09-25: qty 15

## 2019-09-25 MED ORDER — GUAIFENESIN-DM 100-10 MG/5ML PO SYRP
10.0000 mL | ORAL_SOLUTION | ORAL | Status: DC | PRN
  Filled 2019-09-25: qty 10

## 2019-09-25 MED ORDER — MAGNESIUM CITRATE PO SOLN
1.0000 | Freq: Once | ORAL | Status: DC | PRN
  Filled 2019-09-25: qty 296

## 2019-09-25 MED ORDER — IBUPROFEN 400 MG PO TABS: 200.0000 mg | ORAL_TABLET | Freq: Four times a day (QID) | ORAL | Status: DC | PRN

## 2019-09-25 MED ORDER — ACETAMINOPHEN 325 MG PO TABS: 650.0000 mg | ORAL_TABLET | Freq: Four times a day (QID) | ORAL | Status: DC | PRN

## 2019-09-25 MED ORDER — SODIUM CHLORIDE 0.9 % IV SOLN
200.0000 mg | Freq: Once | INTRAVENOUS | Status: AC
  Administered 2019-09-25: 200 mg via INTRAVENOUS
  Filled 2019-09-25: qty 200

## 2019-09-25 MED ORDER — SIMVASTATIN 10 MG PO TABS
10.0000 mg | ORAL_TABLET | Freq: Every day | ORAL | Status: DC
  Administered 2019-09-26 – 2019-10-02 (×7): 10 mg via ORAL
  Filled 2019-09-25 (×8): qty 1

## 2019-09-25 MED ORDER — LETROZOLE 2.5 MG PO TABS
2.5000 mg | ORAL_TABLET | Freq: Every day | ORAL | Status: DC
  Administered 2019-09-26 – 2019-10-03 (×9): 2.5 mg via ORAL
  Filled 2019-09-25 (×9): qty 1

## 2019-09-25 MED ORDER — ENOXAPARIN SODIUM 40 MG/0.4ML ~~LOC~~ SOLN: 40.0000 mg | SUBCUTANEOUS | Status: DC

## 2019-09-25 MED ORDER — CLOPIDOGREL BISULFATE 75 MG PO TABS
75.0000 mg | ORAL_TABLET | Freq: Every day | ORAL | Status: DC
  Administered 2019-09-26 – 2019-10-03 (×9): 75 mg via ORAL
  Filled 2019-09-25 (×9): qty 1

## 2019-09-25 MED ORDER — DEXAMETHASONE SODIUM PHOSPHATE 10 MG/ML IJ SOLN
6.0000 mg | INTRAMUSCULAR | Status: DC
  Administered 2019-09-26 – 2019-10-03 (×8): 6 mg via INTRAVENOUS
  Filled 2019-09-25 (×8): qty 0.6

## 2019-09-25 MED ORDER — ASCORBIC ACID 500 MG PO TABS
500.0000 mg | ORAL_TABLET | Freq: Every day | ORAL | Status: DC
  Administered 2019-09-25 – 2019-10-03 (×9): 500 mg via ORAL
  Filled 2019-09-25 (×11): qty 1

## 2019-09-25 MED ORDER — ENOXAPARIN SODIUM 40 MG/0.4ML ~~LOC~~ SOLN
40.0000 mg | SUBCUTANEOUS | Status: DC
  Administered 2019-09-25 – 2019-10-02 (×8): 40 mg via SUBCUTANEOUS
  Filled 2019-09-25 (×8): qty 0.4

## 2019-09-25 MED ORDER — IPRATROPIUM-ALBUTEROL 20-100 MCG/ACT IN AERS
1.0000 | INHALATION_SPRAY | Freq: Four times a day (QID) | RESPIRATORY_TRACT | Status: DC
  Administered 2019-09-25 – 2019-10-03 (×32): 1 via RESPIRATORY_TRACT
  Filled 2019-09-25: qty 4

## 2019-09-25 MED ORDER — ONDANSETRON HCL 4 MG PO TABS: 4.0000 mg | ORAL_TABLET | Freq: Four times a day (QID) | ORAL | Status: DC | PRN

## 2019-09-25 MED ORDER — ONDANSETRON HCL 4 MG/2ML IJ SOLN: 4.0000 mg | Freq: Four times a day (QID) | INTRAMUSCULAR | Status: DC | PRN

## 2019-09-25 MED ORDER — POLYETHYLENE GLYCOL 3350 17 G PO PACK: 17.0000 g | PACK | Freq: Every day | ORAL | Status: DC | PRN

## 2019-09-25 MED ORDER — SULFAMETHOXAZOLE-TRIMETHOPRIM 800-160 MG PO TABS
1.0000 | ORAL_TABLET | Freq: Two times a day (BID) | ORAL | Status: AC
  Administered 2019-09-26 – 2019-09-28 (×5): 1 via ORAL
  Filled 2019-09-25 (×6): qty 1

## 2019-09-25 MED ORDER — SODIUM CHLORIDE 0.9 % IV SOLN
100.0000 mg | Freq: Every day | INTRAVENOUS | Status: AC
  Administered 2019-09-26 – 2019-09-29 (×4): 100 mg via INTRAVENOUS
  Filled 2019-09-25: qty 100
  Filled 2019-09-25: qty 20
  Filled 2019-09-25 (×2): qty 100

## 2019-09-25 NOTE — ED Provider Notes (Signed)
[facility] Emergency Department Provider Note   ____________________________________________    I have reviewed the triage vital signs and the nursing notes.   HISTORY  Chief Complaint Fall     HPI Lori Berger is a 78 y.o. female with a history as noted below who presents with diffuse weakness fatigue and shortness of breath.  Patient seen by me yesterday after a reported mechanical fall.  Known Covid positive.  Apparently today diffusely weak and had to be lowered to the floor because unable to ambulate on her own.  Increased shortness of breath.  Past Medical History:  Diagnosis Date  . Arthritis   . BCC (basal cell carcinoma)   . Breast cancer ([place]) 1990   left 1990  . Breast cancer ([place]) 11/11/2015   Right, T2 (2.4 cm(, N0; positive deep margin, ER: 100%, PR: 60%; Her 2 neu not amplified, Mammoprint: low risk  . Diabetes mellitus without complication ([place])   . Endometrial cancer ([place])   . Hyperlipidemia   . Hypertension   . Major depressive disorder   . PONV (postoperative nausea and vomiting)     Patient Active Problem List   Diagnosis Date Noted  . Stroke ([place]) 01/02/2017  . Acute lower UTI 01/02/2017  . 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 Personal history of malignant neoplasm of breast 06/29/2016  . Malignant neoplasm of right female breast ([place]) 03/30/2016  . Loss of weight 03/30/2016  . Faintness 01/26/2016  . Osteopenia 01/26/2016  . Breast cancer, female, right 09/26/2015  . Diabetes mellitus, type 2 ([place]) 02/07/2015  . Malignant neoplasm of corpus uteri, except isthmus ([place]) 02/07/2015  . Polyp of corpus uteri 02/07/2015  . Essential (primary) hypertension 02/07/2015  . Need for prophylactic hormone replacement therapy (postmenopausal) 02/07/2015  . Decreased potassium in the blood 02/07/2015  . 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 Malaise and fatigue 02/07/2015  . Combined fat and carbohydrate induced  hyperlipemia 02/07/2015  . Adiposity 02/07/2015  . Arthritis, degenerative 02/07/2015  . Allergic rhinitis 02/07/2015  . Postmenopausal bleeding 02/07/2015  . Psychosis ([place]) 02/07/2015  . Inflammation of sacroiliac joint ([place]) 02/07/2015    Past Surgical History:  Procedure Laterality Date  . ABDOMINAL HYSTERECTOMY    . MASTECTOMY Left    1990  . SENTINEL NODE BIOPSY Right 11/11/2015   Procedure: SENTINEL NODE BIOPSY;  Surgeon:  Bellow, MD;  Location: [facility] ORS;  Service: General;  Laterality: Right;  . SIMPLE MASTECTOMY WITH AXILLARY SENTINEL NODE BIOPSY Right 11/11/2015   Procedure: SIMPLE MASTECTOMY;  Surgeon:  Bellow, MD;  Location: [facility] ORS;  Service: General;  Laterality: Right;    Prior to Admission medications   Medication Sig Start Date End Date Taking? Authorizing Provider  amLODipine (NORVASC) 5 MG tablet TAKE 1 TABLET BY MOUTH  DAILY Patient taking differently: Take 5 mg by mouth daily.  06/26/19   Jerrol Banana., MD  aspirin EC 81 MG tablet Take 1 tablet (81 mg total) by mouth daily. 06/28/19   Jerrol Banana., MD  Calcium-Vitamin D-Vitamin K [phone] MG-UNT-MCG TABS Take 1 tablet by mouth daily.    [provider]  Canagliflozin-metFORMIN HCl (INVOKAMET) 150-500 MG TABS Take 1 tablet by mouth 2 (two) times daily. 12/19/18   Jerrol Banana., MD  clopidogrel (PLAVIX) 75 MG tablet TAKE 1 TABLET BY MOUTH  DAILY Patient taking differently: Take 75 mg by mouth daily.  06/26/19   Jerrol Banana., MD  glucose blood test strip Check sugar daily 04/04/17   Carmon Ginsberg, PA  ibuprofen (ADVIL) 200 MG tablet Take 1 tablet (200 mg total) by mouth every 6 (six) hours as needed for moderate pain. 01/06/17   Max Sane, MD  letrozole ([place]) 2.5 MG tablet TAKE 1 TABLET BY MOUTH  DAILY Patient taking differently: Take 2.5 mg by mouth daily.  08/16/19   Jerrol Banana., MD  lisinopril (ZESTRIL) 20 MG tablet TAKE 1 TABLET BY MOUTH   DAILY Patient taking differently: Take 20 mg by mouth daily.  04/05/19   Jerrol Banana., MD  nystatin cream (MYCOSTATIN) Apply 1 application topically 2 (two) times daily. 09/19/19   Jerrol Banana., MD  simvastatin (ZOCOR) 10 MG tablet TAKE 1 TABLET BY MOUTH AT  BEDTIME Patient taking differently: Take 10 mg by mouth at bedtime.  08/16/19   Jerrol Banana., MD  sulfamethoxazole-trimethoprim (BACTRIM) 400-80 MG tablet Take 1 tablet by mouth 2 (two) times daily. 09/24/19   Jerrol Banana., MD     Allergies Penicillins  Family History  Problem Relation Age of Onset  . Heart disease Mother        Fatal MI age 73  . Stroke Father        fatal age 74  . Cancer Sister        Colon  . Cancer Brother        Died from Kidney cancer  . Breast cancer Neg Hx     Social History Social History   Tobacco Use  . Smoking status: Never Smoker  . Smokeless tobacco: Never Used  Substance Use Topics  . Alcohol use: No  . Drug use: No    Review of Systems  Constitutional: Positive chills Eyes: No visual changes.  ENT: No sore throat. Cardiovascular: Denies chest pain. Respiratory: As above Gastrointestinal: No abdominal pain.   Genitourinary: Negative for dysuria. Musculoskeletal: Negative for back pain. Skin: Negative for rash. Neurological: Negative for headaches    ____________________________________________   PHYSICAL EXAM:  VITAL SIGNS: ED Triage Vitals  Enc Vitals Group     BP 09/25/19 1228 (!) 92/45     Pulse Rate 09/25/19 1228 (!) 111     Resp 09/25/19 1228 20     Temp 09/25/19 1228 98.6 F (37 C)     Temp Source 09/25/19 1228 Oral     SpO2 09/25/19 1228 (!) 87 %     Weight --      Height --      Head Circumference --      Peak Flow --      Pain Score 09/25/19 1227 10     Pain Loc --      Pain Edu? --      Excl. in [place]? --     Constitutional: Alert and oriented.   Nose: No congestion/rhinnorhea. Mouth/Throat: Mucous membranes  are moist.    Cardiovascular: Normal rate, regular rhythm. Grossly normal heart sounds.  Good peripheral circulation. Respiratory: Mildly increased work of breathing, positive tachypnea, bibasilar [place] Gastrointestinal: Soft and nontender. No distention.   Musculoskeletal: No lower extremity tenderness nor edema.  Warm and well perfused Neurologic:  Normal speech and language. No gross focal neurologic deficits are appreciated.  Skin:  Skin is warm, dry and intact. No rash noted. Psychiatric: Mood and affect are normal. Speech and behavior are normal.  ____________________________________________   LABS (all labs ordered are listed, but only abnormal results are  displayed)  Labs Reviewed  CBC WITH DIFFERENTIAL/PLATELET - Abnormal; Notable for the following components:      Result Value   WBC 11.2 (*)    Neutro Abs 9.3 (*)    All other components within normal limits  COMPREHENSIVE METABOLIC PANEL - Abnormal; Notable for the following components:   CO2 18 (*)    Glucose, Bld 202 (*)    BUN 38 (*)    Creatinine, Ser 1.06 (*)    GFR calc non Af Amer 50 (*)    GFR calc Af Amer 57 (*)    All other components within normal limits  TROPONIN I (HIGH SENSITIVITY) - Abnormal; Notable for the following components:   Troponin I (High Sensitivity) 35 (*)    All other components within normal limits  CULTURE, BLOOD (ROUTINE X 2)  CULTURE, BLOOD (ROUTINE X 2)  LACTIC ACID, PLASMA  PROCALCITONIN  BRAIN NATRIURETIC PEPTIDE  LACTIC ACID, PLASMA   ____________________________________________  EKG  None ____________________________________________  RADIOLOGY  Chest x-ray shows bilateral infiltrates consistent with COVID-19 ____________________________________________   PROCEDURES  Procedure(s) performed: No  Procedures   Critical Care performed: yes  CRITICAL CARE Performed by: Lavonia Drafts   Total critical care time: 30 minutes  Critical care time was exclusive of  separately billable procedures and treating other patients.  Critical care was necessary to treat or prevent imminent or life-threatening deterioration.  Critical care was time spent personally by me on the following activities: development of treatment plan with patient and/or surrogate as well as nursing, discussions with consultants, evaluation of patient's response to treatment, examination of patient, obtaining history from patient or surrogate, ordering and performing treatments and interventions, ordering and review of laboratory studies, ordering and review of radiographic studies, pulse oximetry and re-evaluation of patient's condition.  ____________________________________________   INITIAL IMPRESSION / ASSESSMENT AND PLAN / ED COURSE  Pertinent labs & imaging results that were available during my care of the patient were reviewed by me and considered in my medical decision making (see chart for details).  Patient presents with hypoxia, fatigue, weakness in the setting of known novel coronavirus infection.  Chest x-ray shows worsening infiltrates, lab work overall reassuring.  She is requiring nasal cannula oxygen, she will require admission for further management, given IV Decadron     ____________________________________________   FINAL CLINICAL IMPRESSION(S) / ED DIAGNOSES  Final diagnoses:  COVID-19  Acute respiratory disease due to COVID-19 virus        Note:  This document was prepared using Dragon voice recognition software and may include unintentional dictation errors.   Lavonia Drafts, MD 09/25/19 [phone]

## 2019-09-25 NOTE — H&P (Signed)
History and Physical    Lori Berger [id number] DOB: 1941/04/18 DOA: 09/25/2019  PCP: Jerrol Banana., MD  Patient coming from: SNF, [name]  I have personally briefly reviewed patient's old medical records in [place]  Chief Complaint: fall  HPI: Lori Berger is a 78 y.o. female with medical history significant of hypertension, diabetes, arthritis, hyperlipidemia, depression.  She presented to the ED this afternoon from her SNF after an apparent fall.  Patient was seen in the ED yesterday for a fall as well all imaging that time was negative.  Per report, SNF staff reported to the EMS that patient had a near fall today, did not actually hit the ground.  She was known to be positive for COVID-19 when seen yesterday, however was not requiring oxygen or having any respiratory or GI symptoms.  In the ED today however patient was found to be hypoxic requiring 2 L/min of oxygen to maintain her O2 sat over 90%.  Patient denies fevers or chills, cough, sore throat, congestion, abdominal pain, nausea, vomiting, and diarrhea.  She does report feeling quite fatigued and generally weak recently.  She has no other acute complaints.  In addition, patient had recently been diagnosed with a UTI.  Per her daughter in law, patient had just been started on Bactrim yesterday.  She request we continue antibiotics for this.  Urine culture showed staph epidermidis sensitive to Bactrim, will continue.  ED Course: O2 sat 87% on room air, improved to mid 90s on 2 L/min oxygen nasal cannula.  Blood pressure 92/45.  Tachypneic, tachycardic.  Labs notable for CO2 18, glucose 202, creatinine 1.06, BUN 38, mild leukocytosis WBC 11.2k.  Chest x-ray showed patchy densities in both lung bases consistent with atelectasis or pneumonia.  Given that she is known to be Covid positive, this is consistent with pneumonia secondary to COVID-19 virus.  Admitted to monitored bed as inpatient for further  management.   Review of Systems: As per HPI otherwise 10 point review of systems negative.    Past Medical History:  Diagnosis Date  . Arthritis   . BCC (basal cell carcinoma)   . Breast cancer ([place]) 1990   left 1990  . Breast cancer ([place]) 11/11/2015   Right, T2 (2.4 cm(, N0; positive deep margin, ER: 100%, PR: 60%; Her 2 neu not amplified, Mammoprint: low risk  . Diabetes mellitus without complication ([place])   . Endometrial cancer ([place])   . Hyperlipidemia   . Hypertension   . Major depressive disorder   . PONV (postoperative nausea and vomiting)     Past Surgical History:  Procedure Laterality Date  . ABDOMINAL HYSTERECTOMY    . MASTECTOMY Left    1990  . SENTINEL NODE BIOPSY Right 11/11/2015   Procedure: SENTINEL NODE BIOPSY;  Surgeon: Robert Bellow, MD;  Location: [facility] ORS;  Service: General;  Laterality: Right;  . SIMPLE MASTECTOMY WITH AXILLARY SENTINEL NODE BIOPSY Right 11/11/2015   Procedure: SIMPLE MASTECTOMY;  Surgeon: Robert Bellow, MD;  Location: [facility] ORS;  Service: General;  Laterality: Right;     reports that she has never smoked. She has never used smokeless tobacco. She reports that she does not drink alcohol or use drugs.  Allergies  Allergen Reactions  . Penicillins     Family History  Problem Relation Age of Onset  . Heart disease Mother        Fatal MI age 22  . Stroke Father  fatal age 60  . Cancer Sister        Colon  . Cancer Brother        Died from Kidney cancer  . Breast cancer Neg Hx    Unacceptable: Noncontributory, unremarkable, or negative. Acceptable: Family history reviewed and not pertinent (If you reviewed it)  Prior to Admission medications   Medication Sig Start Date End Date Taking? Authorizing Provider  amLODipine (NORVASC) 5 MG tablet TAKE 1 TABLET BY MOUTH  DAILY Patient taking differently: Take 5 mg by mouth daily.  06/26/19   Jerrol Banana., MD  aspirin EC 81 MG tablet Take 1 tablet (81 mg total) by  mouth daily. 06/28/19   Jerrol Banana., MD  Calcium-Vitamin D-Vitamin K (623) [phone] MG-UNT-MCG TABS Take 1 tablet by mouth daily.    [provider]  Canagliflozin-metFORMIN HCl (INVOKAMET) 150-500 MG TABS Take 1 tablet by mouth 2 (two) times daily. 12/19/18   Jerrol Banana., MD  clopidogrel (PLAVIX) 75 MG tablet TAKE 1 TABLET BY MOUTH  DAILY Patient taking differently: Take 75 mg by mouth daily.  06/26/19   Jerrol Banana., MD  glucose blood test strip Check sugar daily 04/04/17   Carmon Ginsberg, PA  ibuprofen (ADVIL) 200 MG tablet Take 1 tablet (200 mg total) by mouth every 6 (six) hours as needed for moderate pain. 01/06/17   Max Sane, MD  letrozole ([place]) 2.5 MG tablet TAKE 1 TABLET BY MOUTH  DAILY Patient taking differently: Take 2.5 mg by mouth daily.  08/16/19   Jerrol Banana., MD  lisinopril (ZESTRIL) 20 MG tablet TAKE 1 TABLET BY MOUTH  DAILY Patient taking differently: Take 20 mg by mouth daily.  04/05/19   Jerrol Banana., MD  nystatin cream (MYCOSTATIN) Apply 1 application topically 2 (two) times daily. 09/19/19   Jerrol Banana., MD  simvastatin (ZOCOR) 10 MG tablet TAKE 1 TABLET BY MOUTH AT  BEDTIME Patient taking differently: Take 10 mg by mouth at bedtime.  08/16/19   Jerrol Banana., MD  sulfamethoxazole-trimethoprim (BACTRIM) 400-80 MG tablet Take 1 tablet by mouth 2 (two) times daily. 09/24/19   Jerrol Banana., MD    Physical Exam: Vitals:   09/25/19 1600 09/25/19 1630 09/25/19 1700 09/25/19 1715  BP: 133/86 (!) 118/54 129/71   Pulse:  100  97  Resp: 20 17 (!) 21 (!) 21  Temp:      TempSrc:      SpO2:  100%  100%     Vitals:   09/25/19 1600 09/25/19 1630 09/25/19 1700 09/25/19 1715  BP: 133/86 (!) 118/54 129/71   Pulse:  100  97  Resp: 20 17 (!) 21 (!) 21  Temp:      TempSrc:      SpO2:  100%  100%    Constitutional: NAD, calm, comfortable Eyes: EOMI, lids and conjunctivae normal ENMT: Mucous  membranes are moist.  Hearing grossly normal Neck: normal, supple, no meningismus Respiratory: Decreased breath sounds bilaterally, faint bibasilar crackles. Normal respiratory effort. No accessory muscle use.  On 2 L/min oxygen nasal cannula Cardiovascular: Regular rate and rhythm, no murmurs / rubs / gallops. No extremity edema. 2+ pedal pulses. Abdomen: no tenderness, no masses palpated. No hepatosplenomegaly. Bowel sounds positive.  Musculoskeletal: no clubbing / cyanosis.  Bilateral hands with joint hypertrophy consistent with arthritis.  Skin: Dry, intact, normal temperature, no rash seen Neurologic: CN 2-12 grossly intact.  Normal speech.  Grossly nonfocal exam Psychiatric: Normal judgment and insight. Alert and oriented x 3. Normal mood.    Labs on Admission: I have personally reviewed following labs and imaging studies  CBC: Recent Labs  Lab 09/24/19 0930 09/25/19 1241  WBC 6.7 11.2*  NEUTROABS  --  9.3*  HGB 14.5 14.6  HCT 43.8 43.8  MCV 90.7 92.0  PLT 188 0000000   Basic Metabolic Panel: Recent Labs  Lab 09/24/19 0930 09/25/19 1241  NA 137 137  K 3.9 4.0  CL 102 104  CO2 20* 18*  GLUCOSE 173* 202*  BUN 42* 38*  CREATININE 1.03* 1.06*  CALCIUM 9.1 9.1   GFR: Estimated Creatinine Clearance: 41.9 mL/min (A) (by C-G formula based on SCr of 1.06 mg/dL (H)). Liver Function Tests: Recent Labs  Lab 09/24/19 0930 09/25/19 1241  AST 34 32  ALT 18 16  ALKPHOS 56 68  BILITOT 0.8 1.0  PROT 7.0 7.2  ALBUMIN 3.6 3.7   No results for input(s): LIPASE, AMYLASE in the last 168 hours. No results for input(s): AMMONIA in the last 168 hours. Coagulation Profile: No results for input(s): INR, PROTIME in the last 168 hours. Cardiac Enzymes: No results for input(s): CKTOTAL, CKMB, CKMBINDEX, TROPONINI in the last 168 hours. BNP (last 3 results) No results for input(s): PROBNP in the last 8760 hours. HbA1C: No results for input(s): HGBA1C in the last 72 hours. CBG: No  results for input(s): GLUCAP in the last 168 hours. Lipid Profile: No results for input(s): CHOL, HDL, LDLCALC, TRIG, CHOLHDL, LDLDIRECT in the last 72 hours. Thyroid Function Tests: No results for input(s): TSH, T4TOTAL, FREET4, T3FREE, THYROIDAB in the last 72 hours. Anemia Panel: Recent Labs    09/25/19 1507  FERRITIN 459*   Urine analysis:    Component Value Date/Time   COLORURINE STRAW (A) 01/02/2017 1428   APPEARANCEUR HAZY (A) 01/02/2017 1428   LABSPEC 1.028 01/02/2017 1428   PHURINE 5.0 01/02/2017 1428   GLUCOSEU >=500 (A) 01/02/2017 1428   HGBUR [place] (A) 01/02/2017 1428   BILIRUBINUR [place] 09/20/2019 1358   KETONESUR 5 (A) 01/02/2017 1428   PROTEINUR Positive (A) 09/20/2019 1358   PROTEINUR NEGATIVE 01/02/2017 1428   UROBILINOGEN 0.2 09/20/2019 1358   NITRITE Negative 09/20/2019 1358   NITRITE NEGATIVE 01/02/2017 1428   LEUKOCYTESUR Negative 09/20/2019 1358    Radiological Exams on Admission: DG Pelvis 1-2 Views  Result Date: 09/24/2019 CLINICAL DATA:  Diffuse pain after a fall today.  Initial encounter. EXAM: PELVIS - 1-2 VIEW COMPARISON:  None. FINDINGS: There is lucency in the right intertrochanteric femur which could be secondary to fracture. No other evidence of acute abnormality is identified. No focal bony lesion. IMPRESSION: Findings worrisome for right intertrochanteric fracture. Recommend dedicated plain films of the right hip. Electronically Signed   By: Inge Rise M.D.   On: 09/24/2019 09:44   DG Chest Port 1 View  Result Date: 09/25/2019 CLINICAL DATA:  Fall, COVID positive EXAM: PORTABLE CHEST 1 VIEW COMPARISON:  09/24/2019 FINDINGS: The heart size and mediastinal contours are within normal limits. Patchy density at the lung bases, increased on the right since yesterday. No pleural effusion or pneumothorax. The visualized skeletal structures are unremarkable. IMPRESSION: Patchy density at the lung bases, which may reflect atelectasis or pneumonia.  Electronically Signed   By: Macy Mis M.D.   On: 09/25/2019 14:13   DG Chest Portable 1 View  Result Date: 09/24/2019 CLINICAL DATA:  COVID-19 EXAM: PORTABLE CHEST 1 VIEW COMPARISON:  10/24/2013  FINDINGS: Early infiltrate at the left base. No Kerley lines, effusion, or pneumothorax. Normal heart size and mediastinal contours. IMPRESSION: Mild left base pneumonia. Electronically Signed   By: Monte Fantasia M.D.   On: 09/24/2019 09:43   DG Hip Unilat With Pelvis 2-3 Views Right  Result Date: 09/24/2019 CLINICAL DATA:  78 year old female status post fall this morning. Right hip pain. EXAM: DG HIP (WITH OR WITHOUT PELVIS) 2-3V RIGHT COMPARISON:  CT Abdomen and Pelvis 12/18/2009. FINDINGS: Femoral heads are normally located. Hip joint spaces appear normal for age. Bone mineralization is within normal limits for age. Grossly intact proximal left femur. Intact pelvis. The proximal right femur appears intact. No acute fracture identified. Negative visible lower abdominal and pelvic visceral contours. IMPRESSION: No acute fracture or dislocation identified about the right hip or pelvis. Electronically Signed   By: Genevie Ann M.D.   On: 09/24/2019 10:55    EKG: Independently reviewed.  Sinus tachycardia, rate 105 bpm, QTC 482, incomplete right bundle branch block  Assessment/Plan Principal Problem:   Pneumonia due to COVID-19 virus Active Problems:   Acute respiratory failure with hypoxia (HCC)   Diabetes mellitus, type 2 ([place])   Essential (primary) hypertension   Acute lower UTI   Acute respiratory failure with hypoxia secondary to COVID-19 pneumonia --Supplemental oxygen, maintain O2 sat> 90% -Remdesivir per pharmacy protocol -Decadron IV -Vitamin C and zinc -Airborne and contact precautions -Combivent inhaler -Antitussives  Acute lower UTI, present on admission Urine culture of 12/17 reviewed, this grew staph epidermidis sensitive to Bactrim --will treat for 3 days of Bactrim  Type  2 diabetes Home regimen is Invokana-Metformin. --Sliding scale sensitive for now  Essential hypertension -with borderline low blood pressure on admission --Hold home amlodipine and lisinopril for now --Monitor blood pressure closely and resume meds as indicated  Coronary artery disease -stable -Continue aspirin and Plavix  Hyperlipidemia --Continue simvastatin  History of breast and endometrial cancer --Continue home letrozole   DVT prophylaxis: Lovenox Code Status: DNR Family Communication: Spoke with daughter-in-law by phone shortly after admission, she clarified with her husband and his sibling that patient's CODE STATUS is DNR.  She was updated on the plan and relayed that to patient's son, they are agreeable with the plan as outlined above.  Disposition Plan: Pending further clinical improvement and weaning off oxygen, estimate at least a few days Consults called: None  Admission status: Inpatient    Severity of Illness: The appropriate patient status for this patient is INPATIENT. Inpatient status is judged to be reasonable and necessary in order to provide the required intensity of service to ensure the patient's safety. The patient's presenting symptoms, physical exam findings, and initial radiographic and laboratory data in the context of their chronic comorbidities is felt to place them at high risk for further clinical deterioration. Furthermore, it is not anticipated that the patient will be medically stable for discharge from the hospital within 2 midnights of admission. The following factors support the patient status of inpatient.    "           The patient's presenting symptoms include  fatigue, generalized weakness, acute hypoxia. "           The worrisome physical exam findings include acute hypoxia requiring supplemental oxygen, diminished lung sounds with bibasilar crackles. "           The initial radiographic and laboratory data are worrisome because of  COVID-19  positive with bibasilar infiltrates on chest x-ray consistent with pneumonia. "  The chronic co-morbidities include  diabetes, hypertension, coronary artery disease, history of breast and endometrial cancer.     * I certify that at the point of admission it is my clinical judgment that the patient will require inpatient hospital care spanning beyond 2 midnights from the point of admission due to high intensity of service, high risk for further deterioration and high frequency of surveillance required.*    Ezekiel Slocumb, DO [facility]   If 7PM-7AM, please contact night-coverage [email or web address] Password [facility]  09/25/2019, 6:43 PM

## 2019-09-25 NOTE — ED Notes (Signed)
[place] [facility] updated about pt status

## 2019-09-25 NOTE — ED Notes (Signed)
MD at bedside. 

## 2019-09-25 NOTE — ED Notes (Signed)
Pt transported to 217-2C bu EDT Brett at this time

## 2019-09-25 NOTE — Consult Note (Signed)
Remdesivir - Pharmacy Brief Note   O:  ALT: 16 [id number] density at the lung bases, which may reflect atelectasis or pneumonia.  SpO2: admitted 94% on 2L, currently 98% on room air   A/P:  Per ED physician, patient tested positive roughly 1 week ago but had no shortness of breath. Was admitted on 12/21 for unwitnessed fall, but started to have shortness of breath and hospitalist wanted to initiate therapy due to patient being symptomatic.  Remdesivir 200 mg IVPB once followed by 100 mg IVPB daily x 4 days.   Pearla Dubonnet, PharmD Clinical Pharmacist 09/25/2019 3:13 PM

## 2019-09-25 NOTE — ED Notes (Signed)
Pt placed in gown and repositioned in bed. Clean brief and external catheter placed on patient.

## 2019-09-25 NOTE — ED Triage Notes (Signed)
Pt arrives [facility] for fall. Was seen yesterday for same. COVID +. Was getting a bath today and staff assisted pt to knees. Pt afraid of falls but did not actually fall per EMS. Staff put pt back in chair. Pt c/o of pain everywhere. EMS reports no new injuries.   Pants placed on pt upon arrival. Pt afraid of standing. Assisted x 2 to stand and put pants on.   97% RA, 98.5 temp. 106/58.

## 2019-09-26 DIAGNOSIS — I10 Essential (primary) hypertension: Secondary | ICD-10-CM

## 2019-09-26 LAB — CBC WITH DIFFERENTIAL/PLATELET
Abs Immature Granulocytes: 0.04 10*3/uL (ref 0.00–0.07)
Basophils Absolute: 0 10*3/uL (ref 0.0–0.1)
Basophils Relative: 0 %
Eosinophils Absolute: 0 10*3/uL (ref 0.0–0.5)
Eosinophils Relative: 0 %
HCT: 41 % (ref 36.0–46.0)
Hemoglobin: 13.8 g/dL (ref 12.0–15.0)
Immature Granulocytes: 1 %
Lymphocytes Relative: 14 %
Lymphs Abs: 0.7 10*3/uL (ref 0.7–4.0)
MCH: 30.3 pg (ref 26.0–34.0)
MCHC: 33.7 g/dL (ref 30.0–36.0)
MCV: 90.1 fL (ref 80.0–100.0)
Monocytes Absolute: 0.1 10*3/uL (ref 0.1–1.0)
Monocytes Relative: 3 %
Neutro Abs: 4 10*3/uL (ref 1.7–7.7)
Neutrophils Relative %: 82 %
Platelets: 240 10*3/uL (ref 150–400)
RBC: 4.55 MIL/uL (ref 3.87–5.11)
RDW: 12.4 % (ref 11.5–15.5)
Smear Review: NORMAL
WBC: 4.8 10*3/uL (ref 4.0–10.5)
nRBC: 0 % (ref 0.0–0.2)

## 2019-09-26 LAB — COMPREHENSIVE METABOLIC PANEL
ALT: 14 U/L (ref 0–44)
AST: 17 U/L (ref 15–41)
Albumin: 3.1 g/dL — ABNORMAL LOW (ref 3.5–5.0)
Alkaline Phosphatase: 54 U/L (ref 38–126)
Anion gap: 16 — ABNORMAL HIGH (ref 5–15)
BUN: 49 mg/dL — ABNORMAL HIGH (ref 8–23)
CO2: 15 mmol/L — ABNORMAL LOW (ref 22–32)
Calcium: 9.1 mg/dL (ref 8.9–10.3)
Chloride: 108 mmol/L (ref 98–111)
Creatinine, Ser: 0.96 mg/dL (ref 0.44–1.00)
GFR calc Af Amer: >60 mL/min (ref >60)
GFR calc non Af Amer: 56 mL/min — ABNORMAL LOW (ref >60)
Glucose, Bld: 198 mg/dL — ABNORMAL HIGH (ref 70–99)
Potassium: 5.2 mmol/L — ABNORMAL HIGH (ref 3.5–5.1)
Sodium: 139 mmol/L (ref 135–145)
Total Bilirubin: 0.8 mg/dL (ref 0.3–1.2)
Total Protein: 6.7 g/dL (ref 6.5–8.1)

## 2019-09-26 LAB — HEMOGLOBIN A1C
Hgb A1c MFr Bld: 6.5 % — ABNORMAL HIGH (ref 4.8–5.6)
Mean Plasma Glucose: 139.85 mg/dL

## 2019-09-26 LAB — FIBRIN DERIVATIVES D-DIMER (ARMC ONLY): Fibrin derivatives D-dimer (ARMC): 2368.18 ng/mL (FEU) — ABNORMAL HIGH (ref 0.00–499.00)

## 2019-09-26 LAB — GLUCOSE, CAPILLARY
Glucose-Capillary: 173 mg/dL — ABNORMAL HIGH (ref 70–99)
Glucose-Capillary: 181 mg/dL — ABNORMAL HIGH (ref 70–99)
Glucose-Capillary: 209 mg/dL — ABNORMAL HIGH (ref 70–99)
Glucose-Capillary: 265 mg/dL — ABNORMAL HIGH (ref 70–99)

## 2019-09-26 LAB — MRSA PCR SCREENING: MRSA by PCR: NEGATIVE

## 2019-09-26 LAB — MAGNESIUM: Magnesium: 2.7 mg/dL — ABNORMAL HIGH (ref 1.7–2.4)

## 2019-09-26 LAB — FERRITIN: Ferritin: 437 ng/mL — ABNORMAL HIGH (ref 11–307)

## 2019-09-26 LAB — C-REACTIVE PROTEIN: CRP: 15.2 mg/dL — ABNORMAL HIGH (ref <1.0)

## 2019-09-26 MED ORDER — INSULIN ASPART 100 UNIT/ML ~~LOC~~ SOLN
0.0000 [IU] | SUBCUTANEOUS | Status: DC
  Administered 2019-09-26 – 2019-09-27 (×2): 5 [IU] via SUBCUTANEOUS
  Administered 2019-09-27: 3 [IU] via SUBCUTANEOUS
  Administered 2019-09-27: 09:00:00 2 [IU] via SUBCUTANEOUS
  Administered 2019-09-27 (×3): 5 [IU] via SUBCUTANEOUS
  Administered 2019-09-28 (×3): 2 [IU] via SUBCUTANEOUS
  Administered 2019-09-28: 3 [IU] via SUBCUTANEOUS
  Filled 2019-09-26 (×11): qty 1

## 2019-09-26 MED ORDER — NEPRO/CARBSTEADY PO LIQD
237.0000 mL | Freq: Three times a day (TID) | ORAL | Status: DC
  Administered 2019-09-26 – 2019-10-03 (×12): 237 mL via ORAL

## 2019-09-26 MED ORDER — ADULT MULTIVITAMIN W/MINERALS CH
1.0000 | ORAL_TABLET | Freq: Every day | ORAL | Status: DC
  Administered 2019-09-27 – 2019-10-03 (×7): 1 via ORAL
  Filled 2019-09-26 (×7): qty 1

## 2019-09-26 NOTE — Progress Notes (Addendum)
Initial Nutrition Assessment  DOCUMENTATION CODES:   Not applicable  INTERVENTION:   Nepro Shake po TID, each supplement provides 425 kcal and 19 grams protein  Magic cup TID with meals, each supplement provides 290 kcal and 9 grams of protein  MVI daily   Dysphagia 3 diet  NUTRITION DIAGNOSIS:   Increased nutrient needs related to acute illness(COVID 19) as evidenced by increased estimated needs.  GOAL:   Patient will meet greater than or equal to 90% of their needs  MONITOR:   PO intake, Supplement acceptance, Labs, Weight trends, Skin, I & O's  REASON FOR ASSESSMENT:   Malnutrition Screening Tool Assessment of nutrition requirement/status  ASSESSMENT:   78 y.o. female with medical history significant of breast cancer s/p mastectomy, BCC, hypertension, diabetes, arthritis, hyperlipidemia, depression admitted with COVID 19 PNA and UTI   Spoke with patient via phone. Pt reports poor appetite and oral intake for several days pta r/t COVID 19. Pt reports that her appetite is still poor in hospital. Pt upset that she is getting mostly chicken and [place] sent to her room. Pt also reports that the meats are tough and hard to chew. Pt does not drink supplements at home but is willing to try butter pecan Nepro in hospital. RD discussed with pt the importance of adequate nutrition needed to preserve lean muscle and support the immune system. RD will add supplements and MVI to help pt meet her estimated needs. RD will also liberalize the heart healthy portion of pt's diet as this is restrictive and pt is not likely eating enough to exceed any nutrient limits. Will place pt on mechanical soft diet so her meats will arrive chopped therefore making it easier for her to chew.   Per chart, pt with 11lb(6%) weight loss since September; RD unsure how recently weight loss occurred but patient feels as if its been over the past few weeks.   Medications reviewed and include: vitamin C, aspirin,  plavix, dexamethasone, insulin, lovenox, MVI, bactrim, zinc  Labs reviewed: K 5.2(H), BUN 49(H), Mg 2.7(H) cbgs- 192, 202, 172 x 24 hrs AIC 7.1(H)- 12/22  Unable to complete Nutrition-Focused physical exam at this time as pt with COVID 19.   Diet Order:   Diet Order            DIET DYS 3 Room service appropriate? Yes; Fluid consistency: Thin  Diet effective now             EDUCATION NEEDS:   Education needs have been addressed  Skin:  Skin Assessment: Reviewed RN Assessment  Last BM:  unknown  Height:   Ht Readings from Last 1 Encounters:  09/25/19 5\' 4"  (1.626 m)    Weight:   Wt Readings from Last 1 Encounters:  09/25/19 74.8 kg    Ideal Body Weight:  54.5 kg  BMI:  Body mass index is 28.32 kg/m.  Estimated Nutritional Needs:   Kcal:  1700-1900kcal/day  Protein:  85-95g/day  Fluid:  1.6L/day  Koleen Distance MS, RD, LDN Pager #- [phone] Office#- [phone] After Hours Pager: [phone]

## 2019-09-26 NOTE — TOC Initial Note (Signed)
Transition of Care [facility]) - Initial/Assessment Note    Patient Details  Name: Lori Berger MRN: [id number] Date of Birth: 1940-10-12  [facility] CM/SW Contact:    [name], RN Phone Number: 09/26/2019, 4:11 PM  Clinical Narrative:                 PT eval pending Message left for [name] at [facility] to determine if patient could return pending PT recommendation.  Awaiting return call         Patient Goals and CMS Choice        Expected Discharge Plan and Services                                                Prior Living Arrangements/Services                       Activities of Daily Living Home Assistive Devices/Equipment: Gilford Rile (specify type) ADL Screening (condition at time of admission) Patient's cognitive ability adequate to safely complete daily activities?: Yes Is the patient deaf or have difficulty hearing?: No Does the patient have difficulty seeing, even when wearing glasses/contacts?: No Does the patient have difficulty concentrating, remembering, or making decisions?: No Patient able to express need for assistance with ADLs?: Yes Does the patient have difficulty dressing or bathing?: Yes Independently performs ADLs?: No Communication: Independent Dressing (OT): Needs assistance Is this a change from baseline?: Change from baseline, expected to last <3days Grooming: Needs assistance Is this a change from baseline?: Change from baseline, expected to last <3 days Feeding: Needs assistance Is this a change from baseline?: Change from baseline, expected to last <3 days Bathing: Needs assistance Is this a change from baseline?: Change from baseline, expected to last <3 days Toileting: Needs assistance Is this a change from baseline?: Change from baseline, expected to last <3 days In/Out Bed: Needs assistance Is this a change from baseline?: Change from baseline, expected to last <3 days Walks in Home:  Needs assistance Is this a change from baseline?: Change from baseline, expected to last <3 days Does the patient have difficulty walking or climbing stairs?: Yes Weakness of Legs: Both Weakness of Arms/Hands: Both  Permission Sought/Granted                  Emotional Assessment              Admission diagnosis:  Pneumonia due to COVID-19 virus [U07.1, J12.89] Acute respiratory disease due to COVID-19 virus [U07.1, J06.9] COVID-19 [U07.1] Patient Active Problem List   Diagnosis Date Noted  . Pneumonia due to COVID-19 virus 09/25/2019  . Acute respiratory failure with hypoxia ([place]) 09/25/2019  . Stroke ([place]) 01/02/2017  . Acute lower UTI 01/02/2017  . 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 Personal history of malignant neoplasm of breast 06/29/2016  . Malignant neoplasm of right female breast ([place]) 03/30/2016  . Loss of weight 03/30/2016  . Faintness 01/26/2016  . Osteopenia 01/26/2016  . Breast cancer, female, right 09/26/2015  . Diabetes mellitus, type 2 ([place]) 02/07/2015  . 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 Polyp of corpus uteri 02/07/2015  . Essential (primary) hypertension 02/07/2015  . Need for prophylactic hormone replacement therapy (postmenopausal) 02/07/2015  . Decreased potassium in the blood 02/07/2015  . 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 Malaise and fatigue 02/07/2015  . Combined fat and carbohydrate induced hyperlipemia 02/07/2015  . Adiposity 02/07/2015  . Arthritis, degenerative 02/07/2015  . Allergic rhinitis 02/07/2015  . Postmenopausal bleeding 02/07/2015  . Psychosis ([place]) 02/07/2015  . 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) 02/07/2015   PCP:  Jerrol Banana., MD Pharmacy:   [facility] [address] [place] [address] Phone: [phone] Fax: [place], [facility] [address]  [place] [address] [place] [address] Phone: [phone] Fax: [phone]     Social Determinants of Health (SDOH) Interventions    Readmission Risk Interventions No flowsheet data found.

## 2019-09-26 NOTE — Progress Notes (Signed)
PROGRESS NOTE    SHANDIIN WOOLUMS  [id number] DOB: [birth year] DOA: 09/25/2019  PCP: Jerrol Banana., MD    LOS - 1   Brief Narrative:  78 y.o. female with medical history significant of hypertension, diabetes, arthritis, hyperlipidemia, depression.  She presented to the ED from her SNF after an apparent fall, after being in the ED day before also for a fall at which time all imaging that time was negative.  Per report, SNF staff reported to the EMS that patient had a near fall today, did not actually hit the ground.  Patient is known recently tested positive for COVID-19.  This visit, patient hypoxic requiring 2 L/min of oxygen to maintain her O2 sat over 90%.  She denies respiratory or GI symptoms, but reports fatigued and generally weak recently.  In the ED, O2 sat 87% on room air, improved to mid 90s on 2 L/min oxygen.  BP 92/45.  Tachypneic, tachycardic. Chest x-ray showed patchy densities in both lung bases consistent with atelectasis or pneumonia.  Admitted to monitored bed as inpatient for further management of acute hypoxic respiratory failure secondary to Covid-19  Pneumonia.    Subjective 12/23: Patient seen this AM.  No acute events reported overnight.  She reports feeling okay, says still very tired.  Denies fever/chils, cough, SOB, chest pain, N/V/D or other complaints.    Assessment & Plan:   Principal Problem:   Pneumonia due to COVID-19 virus Active Problems:   Acute respiratory failure with hypoxia (HCC)   Diabetes mellitus, type 2 (HCC)   Essential (primary) hypertension   Acute lower UTI   Acute respiratory failure with hypoxia secondary to COVID-19 pneumonia --Supplemental oxygen, maintain O2 sat> 90% -Remdesivir per pharmacy protocol -Decadron IV -Vitamin C and zinc -Airborne and contact precautions -Combivent inhaler -Antitussives  Acute lower UTI, present on admission Urine culture of 12/17 reviewed, this grew staph epidermidis sensitive to  Bactrim --will treat for 3 days of Bactrim  Type 2 diabetes Home regimen is Invokana-Metformin. --Sliding scale sensitive for now  Essential hypertension -with borderline low blood pressure on admission --Hold home amlodipine and lisinopril for now --Monitor blood pressure closely and resume meds as indicated  Coronary artery disease -stable -Continue aspirin and Plavix  Hyperlipidemia --Continue simvastatin  History of breast and endometrial cancer --Continue home letrozole   DVT prophylaxis: Lovenox   Code Status: DNR  Family Communication: daughter-in-law updated by phone  Disposition Plan:  Pending further improvement and wean oxygen, hopefully in a few days or less   Consultants:   none  Procedures:   none  Antimicrobials:   none    Objective: Vitals:   09/25/19 2200 09/25/19 2212 09/25/19 2215 09/25/19 2242  BP: 113/67  113/67 (!) 139/95  Pulse:  (!) 101 94 97  Resp: (!) 22 18 20 20   Temp:    98 F (36.7 C)  TempSrc:    Oral  SpO2:  96% 100% 96%  Weight:      Height:        Intake/Output Summary (Last 24 hours) at 09/26/2019 0858 Last data filed at 09/25/2019 2218 Gross per 24 hour  Intake 250 ml  Output 400 ml  Net -150 ml   Filed Weights   09/25/19 2106  Weight: 74.8 kg    Examination:  General exam: awake, alert, no acute distress HEENT: moist mucus membranes, hearing grossly normal  Respiratory system: clear to auscultation bilaterally, no wheezes, rales or rhonchi, normal respiratory effort. Cardiovascular system:  normal S1/S2, RRR, no JVD, murmurs, rubs, gallops, no pedal edema.   Gastrointestinal system: soft, non-tender, non-distended abdomen Central nervous system: alert and oriented x3. no gross focal neurologic deficits, normal speech Extremities: moves all, no edema, normal tone Skin: dry, intact, normal temperature Psychiatry: normal mood, congruent affect   Data Reviewed: I have personally reviewed following labs  and imaging studies  CBC: Recent Labs  Lab 09/24/19 0930 09/25/19 1241 09/26/19 0606  WBC 6.7 11.2* 4.8  NEUTROABS  --  9.3* 4.0  HGB 14.5 14.6 13.8  HCT 43.8 43.8 41.0  MCV 90.7 92.0 90.1  PLT 188 258 [id number]   Basic Metabolic Panel: Recent Labs  Lab 09/24/19 0930 09/25/19 1241 09/26/19 0606  NA 137 137 139  K 3.9 4.0 5.2*  CL 102 104 108  CO2 20* 18* 15*  GLUCOSE 173* 202* 198*  BUN 42* 38* 49*  CREATININE 1.03* 1.06* 0.96  CALCIUM 9.1 9.1 9.1  MG  --   --  2.7*   GFR: Estimated Creatinine Clearance: 46.3 mL/min (by C-G formula based on SCr of 0.96 mg/dL). Liver Function Tests: Recent Labs  Lab 09/24/19 0930 09/25/19 1241 09/26/19 0606  AST 34 32 17  ALT 18 16 14   ALKPHOS 56 68 54  BILITOT 0.8 1.0 0.8  PROT 7.0 7.2 6.7  ALBUMIN 3.6 3.7 3.1*   No results for input(s): LIPASE, AMYLASE in the last 168 hours. No results for input(s): AMMONIA in the last 168 hours. Coagulation Profile: No results for input(s): INR, PROTIME in the last 168 hours. Cardiac Enzymes: No results for input(s): CKTOTAL, CKMB, CKMBINDEX, TROPONINI in the last 168 hours. BNP (last 3 results) No results for input(s): PROBNP in the last 8760 hours. HbA1C: Recent Labs    09/25/19 1905  HGBA1C 7.1*   CBG: Recent Labs  Lab 09/25/19 1902 09/25/19 2215 09/26/19 0732  GLUCAP 192* 202* 173*   Lipid Profile: No results for input(s): CHOL, HDL, LDLCALC, TRIG, CHOLHDL, LDLDIRECT in the last 72 hours. Thyroid Function Tests: No results for input(s): TSH, T4TOTAL, FREET4, T3FREE, THYROIDAB in the last 72 hours. Anemia Panel: Recent Labs    09/25/19 1507 09/26/19 0606  FERRITIN 459* 437*   Sepsis Labs: Recent Labs  Lab 09/25/19 1241 09/25/19 1329 09/25/19 1507  PROCALCITON 0.14  --   --   LATICACIDVEN  --  1.8 1.6    Recent Results (from the past 240 hour(s))  Urine Culture     Status: Abnormal   Collection Time: 09/20/19 11:20 AM   Specimen: Urine   UR  Result Value Ref  Range Status   Urine Culture, Routine Final report (A)  Final   Organism ID, Bacteria Comment (A)  Final    Comment: Staphylococcus epidermidis Greater than 100,000 colony forming units per mL Based on resistance to oxacillin this isolate would be resistant to all currently available beta-lactam antimicrobial agents, with the exception of the newer cephalosporins with anti-MRSA activity, such as Ceftaroline    Antimicrobial Susceptibility Comment  Final    Comment:       ** S = Susceptible; I = Intermediate; R = Resistant **                    P = Positive; N = Negative             MICS are expressed in micrograms per mL    Antibiotic  RSLT#1    RSLT#2    RSLT#3    RSLT#4 Ciprofloxacin                  S Gentamicin                     S Levofloxacin                   S Linezolid                      S Nitrofurantoin                 S Oxacillin                      R Penicillin                     R Quinupristin/Dalfopristin      S Rifampin                       S Tetracycline                   R Trimethoprim/Sulfa             S Vancomycin                     S   Blood Culture (routine x 2)     Status: None (Preliminary result)   Collection Time: 09/25/19  1:29 PM   Specimen: BLOOD  Result Value Ref Range Status   Specimen Description BLOOD BLOOD RIGHT WRIST  Final   Special Requests   Final    BOTTLES DRAWN AEROBIC AND ANAEROBIC Blood Culture adequate volume   Culture   Final    NO GROWTH < 24 HOURS Performed at [facility], [address], [place], [place] [address]    Report Status PENDING  Incomplete  Blood Culture (routine x 2)     Status: None (Preliminary result)   Collection Time: 09/25/19  1:29 PM   Specimen: BLOOD  Result Value Ref Range Status   Specimen Description BLOOD RIGHT ANTECUBITAL  Final   Special Requests   Final    BOTTLES DRAWN AEROBIC AND ANAEROBIC Blood Culture adequate volume   Culture   Final    NO GROWTH < 24  HOURS Performed at [facility], [address] [place], [place] [address]    Report Status PENDING  Incomplete  MRSA PCR Screening     Status: None   Collection Time: 09/26/19 12:59 AM   Specimen: Nasopharyngeal  Result Value Ref Range Status   MRSA by PCR NEGATIVE NEGATIVE Final    Comment:        The GeneXpert MRSA Assay (FDA approved for NASAL specimens only), is one component of a comprehensive MRSA colonization surveillance program. It is not intended to diagnose MRSA infection nor to guide or monitor treatment for MRSA infections. Performed at [facility], [address] [place], [place] [address]          Radiology Studies: DG Pelvis 1-2 Views  Result Date: 09/24/2019 CLINICAL DATA:  Diffuse pain after a fall today.  Initial encounter. EXAM: PELVIS - 1-2 VIEW COMPARISON:  None. FINDINGS: There is lucency in the right intertrochanteric femur which could be secondary to fracture. No other evidence of acute abnormality is identified. No focal bony lesion. IMPRESSION:  Findings worrisome for right intertrochanteric fracture. Recommend dedicated plain films of the right hip. Electronically Signed   By: Inge Rise M.D.   On: 09/24/2019 09:44   DG Chest Port 1 View  Result Date: 09/25/2019 CLINICAL DATA:  Fall, COVID positive EXAM: PORTABLE CHEST 1 VIEW COMPARISON:  09/24/2019 FINDINGS: The heart size and mediastinal contours are within normal limits. Patchy density at the lung bases, increased on the right since yesterday. No pleural effusion or pneumothorax. The visualized skeletal structures are unremarkable. IMPRESSION: Patchy density at the lung bases, which may reflect atelectasis or pneumonia. Electronically Signed   By: Macy Mis M.D.   On: 09/25/2019 14:13   DG Chest Portable 1 View  Result Date: 09/24/2019 CLINICAL DATA:  COVID-19 EXAM: PORTABLE CHEST 1 VIEW COMPARISON:  10/24/2013 FINDINGS: Early infiltrate at the left base. No  Kerley lines, effusion, or pneumothorax. Normal heart size and mediastinal contours. IMPRESSION: Mild left base pneumonia. Electronically Signed   By: Monte Fantasia M.D.   On: 09/24/2019 09:43   DG Hip Unilat With Pelvis 2-3 Views Right  Result Date: 09/24/2019 CLINICAL DATA:  78 year old female status post fall this morning. Right hip pain. EXAM: DG HIP (WITH OR WITHOUT PELVIS) 2-3V RIGHT COMPARISON:  CT Abdomen and Pelvis 12/18/2009. FINDINGS: Femoral heads are normally located. Hip joint spaces appear normal for age. Bone mineralization is within normal limits for age. Grossly intact proximal left femur. Intact pelvis. The proximal right femur appears intact. No acute fracture identified. Negative visible lower abdominal and pelvic visceral contours. IMPRESSION: No acute fracture or dislocation identified about the right hip or pelvis. Electronically Signed   By: Genevie Ann M.D.   On: 09/24/2019 10:55        Scheduled Meds: . vitamin C  500 mg Oral Daily  . aspirin EC  81 mg Oral Daily  . clopidogrel  75 mg Oral Daily  . dexamethasone (DECADRON) injection  6 mg Intravenous Q24H  . enoxaparin (LOVENOX) injection  40 mg Subcutaneous Q24H  . insulin aspart  0-9 Units Subcutaneous TID WC  . Ipratropium-Albuterol  1 puff Inhalation Q6H  . letrozole  2.5 mg Oral Daily  . nystatin cream  1 application Topical BID  . simvastatin  10 mg Oral QHS  . sulfamethoxazole-trimethoprim  1 tablet Oral Q12H  . zinc sulfate  220 mg Oral Daily   Continuous Infusions: . remdesivir 100 mg in NS 100 mL       LOS: 1 day    Time spent: 30-35 minutes    Ezekiel Slocumb, DO [facility]   If 7PM-7AM, please contact night-coverage [email or web address] Password [facility] 09/26/2019, 8:58 AM

## 2019-09-26 NOTE — Evaluation (Signed)
Physical Therapy Evaluation Patient Details Name: Lori Berger MRN: [id number] DOB: [birth year] Today's Date: 09/26/2019   History of Present Illness  Pt admitted for pneumonia secondary to covid infection. HIstory includes HTN, DM, and depression. Pt with multiple falls, all imaging negative at this time.  Clinical Impression  Pt is a pleasant 78 year old female who was admitted for pneumonia secondary to covid infection. Pt performs bed mobility with min assist and transfers/ambulation with mod assist and RW. Distance limited secondary to R knee pain and balance deficits. Pt demonstrates deficits with strength/balance/cognition. All mobility performed on 2L of O2, however difficult to monitor O2 sats. Doesn't appear to be at baseline level. Would benefit from skilled PT to address above deficits and promote optimal return to PLOF; recommend transition to STR upon discharge from acute hospitalization.     Follow Up Recommendations SNF    Equipment Recommendations  Rolling walker with 5" wheels    Recommendations for Other Services       Precautions / Restrictions Precautions Precautions: Fall Restrictions Weight Bearing Restrictions: No      Mobility  Bed Mobility Overal bed mobility: Needs Assistance Bed Mobility: Supine to Sit     Supine to sit: Min assist     General bed mobility comments: assist for initiating mobility and once seated at EOB, upright posture noted. All mobility performed on 2L of O2.  Transfers Overall transfer level: Needs assistance Equipment used: Rolling walker (2 wheeled) Transfers: Sit to/from Stand Sit to Stand: Mod assist         General transfer comment: needs assist for pushing from seated surface. Once standing, post leaning noted and uncontrolled descent back to bed. Needs several attempts to stand with min assist  Ambulation/Gait Ambulation/Gait assistance: Mod assist Gait Distance (Feet): 5 Feet Assistive device: Rolling walker  (2 wheeled) Gait Pattern/deviations: Step-to pattern     General Gait Details: slow step to gait pattern including side stepping at EOB in addition to several steps in ant direction. Further distance limited secondary to severe R knee pain with WBing. Pt requested to return back bed.  Stairs            Wheelchair Mobility    Modified Rankin (Stroke Patients Only)       Balance Overall balance assessment: Needs assistance;History of Falls Sitting-balance support: Feet supported Sitting balance-Leahy Scale: Good Sitting balance - Comments: safe technique with upright posture   Standing balance support: Bilateral upper extremity supported Standing balance-Leahy Scale: Fair Standing balance comment: upright posture, occasional post leaning                             Pertinent Vitals/Pain Pain Assessment: Faces Faces Pain Scale: Hurts even more Pain Location: R knee Pain Descriptors / Indicators: Discomfort;Dull Pain Intervention(s): Limited activity within patient's tolerance    Home Living Family/patient expects to be discharged to:: Assisted living                 Additional Comments: she has a walker, however she is unsure what type. She is a relatively poor historian.    Prior Function Level of Independence: Needs assistance         Comments: reports staff assist with some ADLs, isn't specific on details. She reports she ambulates with RW and has received PT recently at [facility].     Hand Dominance        Extremity/Trunk Assessment   Upper  Extremity Assessment Upper Extremity Assessment: Overall WFL for tasks assessed    Lower Extremity Assessment Lower Extremity Assessment: Generalized weakness(B LE grossly 3+/5; pain in R LE with exertion)       Communication   Communication: No difficulties  Cognition Arousal/Alertness: Awake/alert Behavior During Therapy: WFL for tasks assessed/performed Overall Cognitive Status: No  family/caregiver present to determine baseline cognitive functioning                                 General Comments: unsure of cognition at baseline, is confused to date and situation. Poor historian      General Comments      Exercises Other Exercises Other Exercises: Pt performed supine/seated ther-ex including B LE LAQ, alt. marching, SLRs, and hip abd/add. 10 reps with min assist and cues for sequencing   Assessment/Plan    PT Assessment Patient needs continued PT services  PT Problem List Decreased strength;Decreased activity tolerance;Decreased balance;Decreased mobility;Pain;Decreased safety awareness;Decreased cognition       PT Treatment Interventions Gait training;DME instruction;Therapeutic exercise;Balance training    PT Goals (Current goals can be found in the Care Plan section)  Acute Rehab PT Goals Patient Stated Goal: to go back to [facility] PT Goal Formulation: With patient Time For Goal Achievement: 10/10/19 Potential to Achieve Goals: Good    Frequency Min 2X/week   Barriers to discharge        Co-evaluation               AM-PAC PT "6 Clicks" Mobility  Outcome Measure Help needed turning from your back to your side while in a flat bed without using bedrails?: A Little Help needed moving from lying on your back to sitting on the side of a flat bed without using bedrails?: A Little Help needed moving to and from a bed to a chair (including a wheelchair)?: A Little Help needed standing up from a chair using your arms (e.g., wheelchair or bedside chair)?: A Little Help needed to walk in hospital room?: A Lot Help needed climbing 3-5 steps with a railing? : Total 6 Click Score: 15    End of Session Equipment Utilized During Treatment: Gait belt;Oxygen Activity Tolerance: Patient tolerated treatment well Patient left: in bed;with bed alarm set Nurse Communication: Mobility status PT Visit Diagnosis: Muscle weakness (generalized)  (M62.81);Unsteadiness on feet (R26.81);History of falling (Z91.81);Difficulty in walking, not elsewhere classified (R26.2);Pain Pain - Right/Left: Right Pain - part of body: Knee    Time: 1401-1444 PT Time Calculation (min) (ACUTE ONLY): 43 min   Charges:   PT Evaluation $PT Eval Low Complexity: 1 Low PT Treatments $Therapeutic Exercise: 23-37 mins        Greggory Stallion, PT, DPT [phone]   Treavor Blomquist 09/26/2019, 4:40 PM

## 2019-09-27 LAB — CBC WITH DIFFERENTIAL/PLATELET
Abs Immature Granulocytes: 0.07 10*3/uL (ref 0.00–0.07)
Basophils Absolute: 0 10*3/uL (ref 0.0–0.1)
Basophils Relative: 0 %
Eosinophils Absolute: 0 10*3/uL (ref 0.0–0.5)
Eosinophils Relative: 0 %
HCT: 40.3 % (ref 36.0–46.0)
Hemoglobin: 13.4 g/dL (ref 12.0–15.0)
Immature Granulocytes: 1 %
Lymphocytes Relative: 10 %
Lymphs Abs: 0.9 10*3/uL (ref 0.7–4.0)
MCH: 30.5 pg (ref 26.0–34.0)
MCHC: 33.3 g/dL (ref 30.0–36.0)
MCV: 91.6 fL (ref 80.0–100.0)
Monocytes Absolute: 0.4 10*3/uL (ref 0.1–1.0)
Monocytes Relative: 5 %
Neutro Abs: 7.4 10*3/uL (ref 1.7–7.7)
Neutrophils Relative %: 84 %
Platelets: 303 10*3/uL (ref 150–400)
RBC: 4.4 MIL/uL (ref 3.87–5.11)
RDW: 12.1 % (ref 11.5–15.5)
WBC: 8.7 10*3/uL (ref 4.0–10.5)
nRBC: 0 % (ref 0.0–0.2)

## 2019-09-27 LAB — COMPREHENSIVE METABOLIC PANEL
ALT: 15 U/L (ref 0–44)
AST: 17 U/L (ref 15–41)
Albumin: 3.1 g/dL — ABNORMAL LOW (ref 3.5–5.0)
Alkaline Phosphatase: 61 U/L (ref 38–126)
Anion gap: 14 (ref 5–15)
BUN: 64 mg/dL — ABNORMAL HIGH (ref 8–23)
CO2: 18 mmol/L — ABNORMAL LOW (ref 22–32)
Calcium: 9.2 mg/dL (ref 8.9–10.3)
Chloride: 110 mmol/L (ref 98–111)
Creatinine, Ser: 1.11 mg/dL — ABNORMAL HIGH (ref 0.44–1.00)
GFR calc Af Amer: 54 mL/min — ABNORMAL LOW (ref >60)
GFR calc non Af Amer: 47 mL/min — ABNORMAL LOW (ref >60)
Glucose, Bld: 221 mg/dL — ABNORMAL HIGH (ref 70–99)
Potassium: 4.5 mmol/L (ref 3.5–5.1)
Sodium: 142 mmol/L (ref 135–145)
Total Bilirubin: 0.6 mg/dL (ref 0.3–1.2)
Total Protein: 6.6 g/dL (ref 6.5–8.1)

## 2019-09-27 LAB — GLUCOSE, CAPILLARY
Glucose-Capillary: 182 mg/dL — ABNORMAL HIGH (ref 70–99)
Glucose-Capillary: 208 mg/dL — ABNORMAL HIGH (ref 70–99)
Glucose-Capillary: 265 mg/dL — ABNORMAL HIGH (ref 70–99)
Glucose-Capillary: 265 mg/dL — ABNORMAL HIGH (ref 70–99)
Glucose-Capillary: 274 mg/dL — ABNORMAL HIGH (ref 70–99)
Glucose-Capillary: 278 mg/dL — ABNORMAL HIGH (ref 70–99)

## 2019-09-27 LAB — FERRITIN: Ferritin: 468 ng/mL — ABNORMAL HIGH (ref 11–307)

## 2019-09-27 LAB — MAGNESIUM: Magnesium: 2.9 mg/dL — ABNORMAL HIGH (ref 1.7–2.4)

## 2019-09-27 LAB — C-REACTIVE PROTEIN: CRP: 10.1 mg/dL — ABNORMAL HIGH (ref <1.0)

## 2019-09-27 LAB — FIBRIN DERIVATIVES D-DIMER (ARMC ONLY): Fibrin derivatives D-dimer (ARMC): 1627.59 ng/mL (FEU) — ABNORMAL HIGH (ref 0.00–499.00)

## 2019-09-27 NOTE — Progress Notes (Signed)
PROGRESS NOTE    Lori Berger  [id number] DOB: 1941-02-04 DOA: 09/25/2019  PCP: Jerrol Banana., MD    LOS - 2   Brief Narrative:  78 y.o.femalewith medical history significant ofhypertension, diabetes, arthritis, hyperlipidemia, depression. She presented to the ED from her SNF after an apparent fall, after being in the ED day before also for a fall at which time all imaging that time was negative. Per report, SNF staff reported to the EMS that patient had a near fall today, did not actually hit the ground. Patient is known recently tested positive for COVID-19.  This visit, patient hypoxic requiring 2 L/min of oxygen to maintain her O2 sat over 90%. She denies respiratory or GI symptoms, but reports fatigued and generally weak recently. In the ED, O2 sat 87% on room air, improved to mid 90s on 2 L/min oxygen.  BP 92/45. Tachypneic, tachycardic. Chest x-ray showed patchy densities in both lung bases consistent with atelectasis or pneumonia. Admitted to monitored bed as inpatient for further management of acute hypoxic respiratory failure secondary to Covid-19  Pneumonia.    Subjective 12/24: Patient seen this AM, up in chair.  Asks if I plan to "get rid of" her today.  She reports feeling fine, denies fever/chills, SOB chest pain or other acute complaints.  Still on oxygen this AM, but attempting to wean off as she was upper 90's on 2L/min.  Per RN, patient pull out IV later this AM.  No other acute events reported.  Assessment & Plan:   Principal Problem:   Pneumonia due to COVID-19 virus Active Problems:   Acute respiratory failure with hypoxia (HCC)   Diabetes mellitus, type 2 (HCC)   Essential (primary) hypertension   Acute lower UTI   Acute respiratory failure with hypoxia secondary to COVID-19 pneumonia --Supplemental oxygen, maintain O2 sat>90% -Remdesivir per pharmacy protocol -Decadron IV -Vitamin C and zinc -Airborne and contact  precautions -Combivent inhaler -Antitussives  Acute lower UTI, present on admission Urine culture of 12/17 reviewed, this grew staph epidermidis sensitive to Bactrim. Had just been started on Bactrim outpatient. --will treat for 3 days of Bactrim  Type 2 diabetes Home regimen is Invokana-Metformin. --Sliding scale sensitive for now  Essential hypertension-with borderline low blood pressure on admission --Hold home amlodipine and lisinopril for now --Monitor blood pressure closely and resume meds as indicated  Coronary artery disease-stable -Continue aspirin and Plavix  Hyperlipidemia --Continue simvastatin  History of breast and endometrial cancer --Continue home letrozole   DVT prophylaxis: Lovenox   Code Status: DNR  Family Communication: son and daughter-in-law updated by phone today Disposition Plan:  Expect dc in 2-3 days, pending 5th day remdesivir (12/26).  Likely to need rehab/SNF.   Consultants:   none  Procedures:   none  Antimicrobials:   none     Objective: Vitals:   09/26/19 1148 09/26/19 2121 09/27/19 0417 09/27/19 0504  BP: 114/61 110/62 (!) 91/46 (!) 113/54  Pulse: 93 95 79 80  Resp: 16 18 17    Temp: 97.7 F (36.5 C) 98.6 F (37 C) 98.5 F (36.9 C)   TempSrc: Oral     SpO2: 95% 95% 97% 99%  Weight:      Height:        Intake/Output Summary (Last 24 hours) at 09/27/2019 0746 Last data filed at 09/27/2019 0400 Gross per 24 hour  Intake 520 ml  Output 1300 ml  Net -780 ml   Filed Weights   09/25/19 2106  Weight:  74.8 kg    Examination:  General exam: awake, alert, no acute distress, pleasant Respiratory system: clear but diminished bilaterally, no wheezes, rales or rhonchi, normal respiratory effort. Cardiovascular system: normal S1/S2, RRR, no JVD, murmurs, rubs, gallops, no pedal edema.   Central nervous system: alert and oriented x3. no gross focal neurologic deficits, normal speech Extremities: moves all,  no edema, normal tone Skin: dry, intact, normal temperature Psychiatry: normal mood, congruent affect    Data Reviewed: I have personally reviewed following labs and imaging studies  CBC: Recent Labs  Lab 09/24/19 0930 09/25/19 1241 09/26/19 0606 09/27/19 0429  WBC 6.7 11.2* 4.8 8.7  NEUTROABS  --  9.3* 4.0 7.4  HGB 14.5 14.6 13.8 13.4  HCT 43.8 43.8 41.0 40.3  MCV 90.7 92.0 90.1 91.6  PLT 188 258 240 [id number]   Basic Metabolic Panel: Recent Labs  Lab 09/24/19 0930 09/25/19 1241 09/26/19 0606 09/27/19 0429  NA 137 137 139 142  K 3.9 4.0 5.2* 4.5  CL 102 104 108 110  CO2 20* 18* 15* 18*  GLUCOSE 173* 202* 198* 221*  BUN 42* 38* 49* 64*  CREATININE 1.03* 1.06* 0.96 1.11*  CALCIUM 9.1 9.1 9.1 9.2  MG  --   --  2.7* 2.9*   GFR: Estimated Creatinine Clearance: 40 mL/min (A) (by C-G formula based on SCr of 1.11 mg/dL (H)). Liver Function Tests: Recent Labs  Lab 09/24/19 0930 09/25/19 1241 09/26/19 0606 09/27/19 0429  AST 34 32 17 17  ALT 18 16 14 15   ALKPHOS 56 68 54 61  BILITOT 0.8 1.0 0.8 0.6  PROT 7.0 7.2 6.7 6.6  ALBUMIN 3.6 3.7 3.1* 3.1*   No results for input(s): LIPASE, AMYLASE in the last 168 hours. No results for input(s): AMMONIA in the last 168 hours. Coagulation Profile: No results for input(s): INR, PROTIME in the last 168 hours. Cardiac Enzymes: No results for input(s): CKTOTAL, CKMB, CKMBINDEX, TROPONINI in the last 168 hours. BNP (last 3 results) No results for input(s): PROBNP in the last 8760 hours. HbA1C: Recent Labs    09/25/19 1905 09/26/19 0932  HGBA1C 7.1* 6.5*   CBG: Recent Labs  Lab 09/26/19 1725 09/26/19 2122 09/27/19 0024 09/27/19 0419 09/27/19 0728  GLUCAP 209* 265* 265* 208* 182*   Lipid Profile: No results for input(s): CHOL, HDL, LDLCALC, TRIG, CHOLHDL, LDLDIRECT in the last 72 hours. Thyroid Function Tests: No results for input(s): TSH, T4TOTAL, FREET4, T3FREE, THYROIDAB in the last 72 hours. Anemia  Panel: Recent Labs    09/26/19 0606 09/27/19 0429  FERRITIN 437* 468*   Sepsis Labs: Recent Labs  Lab 09/25/19 1241 09/25/19 1329 09/25/19 1507  PROCALCITON 0.14  --   --   LATICACIDVEN  --  1.8 1.6    Recent Results (from the past 240 hour(s))  Urine Culture     Status: Abnormal   Collection Time: 09/20/19 11:20 AM   Specimen: Urine   UR  Result Value Ref Range Status   Urine Culture, Routine Final report (A)  Final   Organism ID, Bacteria Comment (A)  Final    Comment: Staphylococcus epidermidis Greater than 100,000 colony forming units per mL Based on resistance to oxacillin this isolate would be resistant to all currently available beta-lactam antimicrobial agents, with the exception of the newer cephalosporins with anti-MRSA activity, such as Ceftaroline    Antimicrobial Susceptibility Comment  Final    Comment:       ** S = Susceptible; I = Intermediate; R =  Resistant **                    P = Positive; N = Negative             MICS are expressed in micrograms per mL    Antibiotic                 RSLT#1    RSLT#2    RSLT#3    RSLT#4 Ciprofloxacin                  S Gentamicin                     S Levofloxacin                   S Linezolid                      S Nitrofurantoin                 S Oxacillin                      R Penicillin                     R Quinupristin/Dalfopristin      S Rifampin                       S Tetracycline                   R Trimethoprim/Sulfa             S Vancomycin                     S   Blood Culture (routine x 2)     Status: None (Preliminary result)   Collection Time: 09/25/19  1:29 PM   Specimen: BLOOD  Result Value Ref Range Status   Specimen Description BLOOD BLOOD RIGHT WRIST  Final   Special Requests   Final    BOTTLES DRAWN AEROBIC AND ANAEROBIC Blood Culture adequate volume   Culture   Final    NO GROWTH < 24 HOURS Performed at [facility], [address], [place], [place] [address]    Report  Status PENDING  Incomplete  Blood Culture (routine x 2)     Status: None (Preliminary result)   Collection Time: 09/25/19  1:29 PM   Specimen: BLOOD  Result Value Ref Range Status   Specimen Description BLOOD RIGHT ANTECUBITAL  Final   Special Requests   Final    BOTTLES DRAWN AEROBIC AND ANAEROBIC Blood Culture adequate volume   Culture   Final    NO GROWTH < 24 HOURS Performed at [facility], [address] [place], [place] [address]    Report Status PENDING  Incomplete  MRSA PCR Screening     Status: None   Collection Time: 09/26/19 12:59 AM   Specimen: Nasopharyngeal  Result Value Ref Range Status   MRSA by PCR NEGATIVE NEGATIVE Final    Comment:        The GeneXpert MRSA Assay (FDA approved for NASAL specimens only), is one component of a comprehensive MRSA colonization surveillance program. It is not intended to diagnose MRSA infection nor to guide or monitor treatment for MRSA infections. Performed at [facility], [address] [place], [place] [address]  Radiology Studies: DG Chest Port 1 View  Result Date: 09/25/2019 CLINICAL DATA:  Fall, COVID positive EXAM: PORTABLE CHEST 1 VIEW COMPARISON:  09/24/2019 FINDINGS: The heart size and mediastinal contours are within normal limits. Patchy density at the lung bases, increased on the right since yesterday. No pleural effusion or pneumothorax. The visualized skeletal structures are unremarkable. IMPRESSION: Patchy density at the lung bases, which may reflect atelectasis or pneumonia. Electronically Signed   By: Macy Mis M.D.   On: 09/25/2019 14:13        Scheduled Meds: . vitamin C  500 mg Oral Daily  . aspirin EC  81 mg Oral Daily  . clopidogrel  75 mg Oral Daily  . dexamethasone (DECADRON) injection  6 mg Intravenous Q24H  . enoxaparin (LOVENOX) injection  40 mg Subcutaneous Q24H  . feeding supplement (NEPRO CARB STEADY)  237 mL Oral TID BM  . insulin aspart  0-9 Units  Subcutaneous Q4H  . Ipratropium-Albuterol  1 puff Inhalation Q6H  . letrozole  2.5 mg Oral Daily  . multivitamin with minerals  1 tablet Oral Daily  . nystatin cream  1 application Topical BID  . simvastatin  10 mg Oral QHS  . sulfamethoxazole-trimethoprim  1 tablet Oral Q12H  . zinc sulfate  220 mg Oral Daily   Continuous Infusions: . remdesivir 100 mg in NS 100 mL Stopped (09/26/19 1043)     LOS: 2 days    Time spent: 35-40 minutes    Ezekiel Slocumb, DO [facility]   If 7PM-7AM, please contact night-coverage [email or web address] Password [facility] 09/27/2019, 7:46 AM

## 2019-09-27 NOTE — TOC Initial Note (Signed)
Transition of Care [facility]) - Initial/Assessment Note    Patient Details  Name: Lori Berger MRN: [id number] Date of Birth: 1940-10-22  [facility] CM/SW Contact:    [name], RN Phone Number: 09/27/2019, 1:23 PM  Clinical Narrative:                 Patient admitted with PNA due to covid RNCM spoke with patient via phone.  She confirms she resides at [facility].  She is aware of the recommendation for SNF and request that I reach out to her Daughter in law Mariann Laster to discuss discharge disposition  RNCM spoke with Vaughan Basta at [facility] -states that at baseline patient is independent of ADLs and ambulates with a rolling walker  RNCM reached out to [place].  She is in agreement to purse SNF placement.  She is aware that there are no facilities accepting covid positive patients in [facility].   Agreeable to bed search Fl2 sent for signature Pasrr obtained Bedsearch initiated  Expected Discharge Plan: [place] Barriers to Discharge: Continued Medical Work up   Patient Goals and CMS Choice        Expected Discharge Plan and Services Expected Discharge Plan: [place]                                              Prior Living Arrangements/Services     Patient language and need for interpreter reviewed:: Yes        Need for Family Participation in Patient Care: Yes (Comment) Care giver support system in place?: Yes (comment)   Criminal Activity/Legal Involvement Pertinent to Current Situation/Hospitalization: No - Comment as needed  Activities of Daily Living Home Assistive Devices/Equipment: Gilford Rile (specify type) ADL Screening (condition at time of admission) Patient's cognitive ability adequate to safely complete daily activities?: Yes Is the patient deaf or have difficulty hearing?: No Does the patient have difficulty seeing, even when wearing glasses/contacts?: No Does the patient have difficulty  concentrating, remembering, or making decisions?: No Patient able to express need for assistance with ADLs?: Yes Does the patient have difficulty dressing or bathing?: Yes Independently performs ADLs?: No Communication: Independent Dressing (OT): Needs assistance Is this a change from baseline?: Change from baseline, expected to last <3days Grooming: Needs assistance Is this a change from baseline?: Change from baseline, expected to last <3 days Feeding: Needs assistance Is this a change from baseline?: Change from baseline, expected to last <3 days Bathing: Needs assistance Is this a change from baseline?: Change from baseline, expected to last <3 days Toileting: Needs assistance Is this a change from baseline?: Change from baseline, expected to last <3 days In/Out Bed: Needs assistance Is this a change from baseline?: Change from baseline, expected to last <3 days Walks in Home: Needs assistance Is this a change from baseline?: Change from baseline, expected to last <3 days Does the patient have difficulty walking or climbing stairs?: Yes Weakness of Legs: Both Weakness of Arms/Hands: Both  Permission Sought/Granted                  Emotional Assessment       Orientation: : Oriented to Self, Oriented to Place, Oriented to  Time   Psych Involvement: No (comment)  Admission diagnosis:  Pneumonia due to COVID-19 virus [U07.1, J12.89] Acute respiratory disease due to COVID-19  virus [U07.1, J06.9] COVID-19 [U07.1] Patient Active Problem List   Diagnosis Date Noted  . Pneumonia due to COVID-19 virus 09/25/2019  . Acute respiratory failure with hypoxia ([place]) 09/25/2019  . Stroke ([place]) 01/02/2017  . Acute lower UTI 01/02/2017  . 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 Personal history of malignant neoplasm of breast 06/29/2016  . Malignant neoplasm of right female breast ([place]) 03/30/2016  . Loss of weight 03/30/2016  . Faintness 01/26/2016  . Osteopenia 01/26/2016  .  Breast cancer, female, right 09/26/2015  . Diabetes mellitus, type 2 ([place]) 02/07/2015  . Malignant neoplasm of corpus uteri, except isthmus ([place]) 02/07/2015  . Polyp of corpus uteri 02/07/2015  . Essential (primary) hypertension 02/07/2015  . Need for prophylactic hormone replacement therapy (postmenopausal) 02/07/2015  . Decreased potassium in the blood 02/07/2015  . 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 Malaise and fatigue 02/07/2015  . Combined fat and carbohydrate induced hyperlipemia 02/07/2015  . Adiposity 02/07/2015  . Arthritis, degenerative 02/07/2015  . Allergic rhinitis 02/07/2015  . Postmenopausal bleeding 02/07/2015  . Psychosis ([place]) 02/07/2015  . 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 [place] [address] [place] [address] Phone: [phone] Fax: [phone]     Social Determinants of Health (SDOH) Interventions    Readmission Risk Interventions No flowsheet data found.

## 2019-09-27 NOTE — Care Management Important Message (Signed)
Important Message  Patient Details  Name: Lori Berger MRN: [id number] Date of Birth: 10/13/1940   Medicare Important Message Given:  Yes     [name], RN 09/27/2019, 1:33 PM

## 2019-09-27 NOTE — Progress Notes (Signed)
Physical Therapy Treatment Patient Details Name: Lori Berger MRN: [id number] DOB: Aug 31, 1941 Today's Date: 09/27/2019    History of Present Illness Pt admitted for pneumonia secondary to covid infection. HIstory includes HTN, DM, and depression. Pt with multiple falls, all imaging negative at this time.    PT Comments    Pt is making progress towards goals, however still appears slightly confused telling the same story over and over and constantly asking therapist her name. Able to perform there-ex this date and improvement in maintaining O2 sats, although they do decrease with OOB exertion. 2L of O2 reapplied. Ambulation limited secondary to 8/10 pain in R knee, subsides with seated/supine position. Will continue to progress as able.   Follow Up Recommendations  SNF     Equipment Recommendations  Rolling walker with 5" wheels    Recommendations for Other Services       Precautions / Restrictions Precautions Precautions: Fall Restrictions Weight Bearing Restrictions: No    Mobility  Bed Mobility Overal bed mobility: Needs Assistance Bed Mobility: Supine to Sit     Supine to sit: Min assist     General bed mobility comments: improved ability to initiate movement towards EOB. Once seated at EOB, upright posture noted. All mobility peformed on RA WNL  Transfers Overall transfer level: Needs assistance Equipment used: Rolling walker (2 wheeled) Transfers: Sit to/from Stand Sit to Stand: Mod assist         General transfer comment: pt tries to pull from seated surface. Needs cues for initial standing. Once standing braces B LE against bed. Takes 2 attempts to fully come to standing.  Ambulation/Gait Ambulation/Gait assistance: Mod assist   Assistive device: Rolling walker (2 wheeled) Gait Pattern/deviations: Step-to pattern     General Gait Details: follow commands well to ambulate to recliner. Still complaining about severe R knee pain with WBing. After  exertion, O2 sats decreased to 87%, 2L of O2 reapplied with sats improving to 93%.   Stairs             Wheelchair Mobility    Modified Rankin (Stroke Patients Only)       Balance Overall balance assessment: Needs assistance;History of Falls Sitting-balance support: Feet supported Sitting balance-Leahy Scale: Good Sitting balance - Comments: safe technique with upright posture   Standing balance support: Bilateral upper extremity supported Standing balance-Leahy Scale: Fair Standing balance comment: upright posture, occasional post leaning                            Cognition Arousal/Alertness: Awake/alert Behavior During Therapy: WFL for tasks assessed/performed Overall Cognitive Status: No family/caregiver present to determine baseline cognitive functioning                                 General Comments: appears somewhat confused, poor historian      Exercises Other Exercises Other Exercises: Pt performed supine/seated ther-ex including B LE SAQ, alt. marching, SLRs, and hip abd/add. 12 reps with min assist and cues for sequencing    General Comments        Pertinent Vitals/Pain Pain Assessment: 0-10 Pain Score: 8  Pain Location: R knee with Wbing Pain Descriptors / Indicators: Discomfort;Dull Pain Intervention(s): Limited activity within patient's tolerance    Home Living                      Prior Function  PT Goals (current goals can now be found in the care plan section) Acute Rehab PT Goals Patient Stated Goal: to go back to [facility] PT Goal Formulation: With patient Time For Goal Achievement: 10/10/19 Potential to Achieve Goals: Good Progress towards PT goals: Progressing toward goals    Frequency    Min 2X/week      PT Plan Current plan remains appropriate    Co-evaluation              AM-PAC PT "6 Clicks" Mobility   Outcome Measure  Help needed turning from your back to  your side while in a flat bed without using bedrails?: A Little Help needed moving from lying on your back to sitting on the side of a flat bed without using bedrails?: A Little Help needed moving to and from a bed to a chair (including a wheelchair)?: A Little Help needed standing up from a chair using your arms (e.g., wheelchair or bedside chair)?: A Little Help needed to walk in hospital room?: A Lot Help needed climbing 3-5 steps with a railing? : Total 6 Click Score: 15    End of Session Equipment Utilized During Treatment: Gait belt;Oxygen Activity Tolerance: Patient tolerated treatment well Patient left: in chair;with chair alarm set Nurse Communication: Mobility status PT Visit Diagnosis: Muscle weakness (generalized) (M62.81);Unsteadiness on feet (R26.81);History of falling (Z91.81);Difficulty in walking, not elsewhere classified (R26.2);Pain Pain - Right/Left: Right Pain - part of body: Knee     Time: [id number] PT Time Calculation (min) (ACUTE ONLY): 38 min  Charges:  $Gait Training: 8-22 mins $Therapeutic Exercise: 23-37 mins                     Greggory Stallion, PT, DPT [phone]    Elisha Cooksey 09/27/2019, 11:39 AM

## 2019-09-27 NOTE — NC FL2 (Signed)
[place] LEVEL OF CARE SCREENING TOOL     IDENTIFICATION  Patient Name: Lori Berger Birthdate: Apr 02, 1941 Sex: female Admission Date (Current Location): 09/25/2019  [facility] and [place] Number:  Engineering geologist and Address:         Provider Number: [id number]  Attending Physician Name and Address:  Ezekiel Slocumb, DO  Relative Name and Phone Number:       Current Level of Care: Hospital Recommended Level of Care: [place] Prior Approval Number:    Date Approved/Denied:   PASRR Number: [id number]  Discharge Plan: SNF    Current Diagnoses: Patient Active Problem List   Diagnosis Date Noted  . Pneumonia due to COVID-19 virus 09/25/2019  . Acute respiratory failure with hypoxia ([place]) 09/25/2019  . Stroke ([place]) 01/02/2017  . Acute lower UTI 01/02/2017  . Stroke (cerebrum) ([place]) 01/02/2017  . Personal history of malignant neoplasm of breast 06/29/2016  . Malignant neoplasm of right female breast ([place]) 03/30/2016  . Loss of weight 03/30/2016  . Faintness 01/26/2016  . Osteopenia 01/26/2016  . Breast cancer, female, right 09/26/2015  . Diabetes mellitus, type 2 ([place]) 02/07/2015  . Malignant neoplasm of corpus uteri, except isthmus ([place]) 02/07/2015  . Polyp of corpus uteri 02/07/2015  . Essential (primary) hypertension 02/07/2015  . Need for prophylactic hormone replacement therapy (postmenopausal) 02/07/2015  . Decreased potassium in the blood 02/07/2015  . 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 Malaise and fatigue 02/07/2015  . Combined fat and carbohydrate induced hyperlipemia 02/07/2015  . Adiposity 02/07/2015  . Arthritis, degenerative 02/07/2015  . Allergic rhinitis 02/07/2015  . Postmenopausal bleeding 02/07/2015  . Psychosis ([place]) 02/07/2015  . Inflammation of sacroiliac joint ([place]) 02/07/2015    Orientation RESPIRATION BLADDER Height & Weight     Self, Time, Situation  O2(2L [place]) Incontinent, External catheter  Weight: 74.8 kg Height:  5\' 4"  (162.6 cm)  BEHAVIORAL SYMPTOMS/MOOD NEUROLOGICAL BOWEL NUTRITION STATUS      Continent Diet(DYS 3)  AMBULATORY STATUS COMMUNICATION OF NEEDS Skin   Extensive Assist Verbally Normal                       Personal Care Assistance Level of Assistance              Functional Limitations Info             SPECIAL CARE FACTORS FREQUENCY  PT (By licensed PT), OT (By licensed OT)                    Contractures Contractures Info: Not present    Additional Factors Info  Code Status, Allergies Code Status Info: DNR Allergies Info: Penicillin           Current Medications (09/27/2019):  This is the current hospital active medication list Current Facility-Administered Medications  Medication Dose Route Frequency Provider Last Rate Last Admin  . acetaminophen (TYLENOL) tablet 650 mg  650 mg Oral Q6H PRN Nicole Kindred A, DO      . ascorbic acid (VITAMIN C) tablet 500 mg  500 mg Oral Daily Nicole Kindred A, DO   500 mg at 09/26/19 1015  . aspirin EC tablet 81 mg  81 mg Oral Daily Nicole Kindred A, DO   81 mg at 09/27/19 [id number]  . bisacodyl (DULCOLAX) EC tablet 5 mg  5 mg Oral Daily PRN Nicole Kindred A, DO      . chlorpheniramine-HYDROcodone (TUSSIONEX) 10-8 MG/5ML suspension 5  mL  5 mL Oral Q12H PRN Nicole Kindred A, DO      . clopidogrel (PLAVIX) tablet 75 mg  75 mg Oral Daily Nicole Kindred A, DO   75 mg at 09/27/19 0908  . dexamethasone (DECADRON) injection 6 mg  6 mg Intravenous Q24H Nicole Kindred A, DO   6 mg at 09/27/19 0908  . enoxaparin (LOVENOX) injection 40 mg  40 mg Subcutaneous Q24H Nicole Kindred A, DO   40 mg at 09/26/19 2229  . feeding supplement (NEPRO CARB STEADY) liquid 237 mL  237 mL Oral TID BM Nicole Kindred A, DO   237 mL at 09/26/19 2231  . guaiFENesin-dextromethorphan (ROBITUSSIN DM) 100-10 MG/5ML syrup 10 mL  10 mL Oral Q4H PRN Nicole Kindred A, DO      . ibuprofen (ADVIL) tablet 200 mg  200 mg Oral Q6H  PRN Nicole Kindred A, DO      . insulin aspart (novoLOG) injection 0-9 Units  0-9 Units Subcutaneous Q4H Lang Snow, NP   2 Units at 09/27/19 [phone]  . Ipratropium-Albuterol (COMBIVENT) respimat 1 puff  1 puff Inhalation Q6H Griffith, Kelly A, DO   1 puff at 09/27/19 0901  . letrozole [facility] [place]) tablet 2.5 mg  2.5 mg Oral Daily Nicole Kindred A, DO   2.5 mg at 09/27/19 0908  . magnesium citrate solution 1 Bottle  1 Bottle Oral Once PRN Nicole Kindred A, DO      . multivitamin with minerals tablet 1 tablet  1 tablet Oral Daily Nicole Kindred A, DO   1 tablet at 09/27/19 0908  . nystatin cream (MYCOSTATIN) 1 application  1 application Topical BID Nicole Kindred A, DO   1 application at [id number]  . ondansetron (ZOFRAN) tablet 4 mg  4 mg Oral Q6H PRN Nicole Kindred A, DO       Or  . ondansetron (ZOFRAN) injection 4 mg  4 mg Intravenous Q6H PRN Nicole Kindred A, DO      . polyethylene glycol (MIRALAX / GLYCOLAX) packet 17 g  17 g Oral Daily PRN Nicole Kindred A, DO      . remdesivir 100 mg in sodium chloride 0.9 % 100 mL IVPB  100 mg Intravenous Daily Nicole Kindred A, DO 200 mL/hr at 09/27/19 0905 100 mg at 09/27/19 0905  . simvastatin (ZOCOR) tablet 10 mg  10 mg Oral QHS Nicole Kindred A, DO   10 mg at 09/26/19 2227  . sulfamethoxazole-trimethoprim (BACTRIM DS) 800-160 MG per tablet 1 tablet  1 tablet Oral Q12H Nicole Kindred A, DO   1 tablet at 09/27/19 0908  . traZODone (DESYREL) tablet 25 mg  25 mg Oral QHS PRN Nicole Kindred A, DO   25 mg at 09/26/19 0103  . zinc sulfate capsule 220 mg  220 mg Oral Daily Nicole Kindred A, DO   220 mg at 09/27/19 0908     Discharge Medications: Please see discharge summary for a list of discharge medications.  Relevant Imaging Results:  Relevant Lab Results:   Additional Information SSN:  [id number]  [name], RN

## 2019-09-28 LAB — COMPREHENSIVE METABOLIC PANEL
ALT: 19 U/L (ref 0–44)
AST: 23 U/L (ref 15–41)
Albumin: 3 g/dL — ABNORMAL LOW (ref 3.5–5.0)
Alkaline Phosphatase: 56 U/L (ref 38–126)
Anion gap: 11 (ref 5–15)
BUN: 65 mg/dL — ABNORMAL HIGH (ref 8–23)
CO2: 17 mmol/L — ABNORMAL LOW (ref 22–32)
Calcium: 9.1 mg/dL (ref 8.9–10.3)
Chloride: 112 mmol/L — ABNORMAL HIGH (ref 98–111)
Creatinine, Ser: 0.92 mg/dL (ref 0.44–1.00)
GFR calc Af Amer: >60 mL/min (ref >60)
GFR calc non Af Amer: 59 mL/min — ABNORMAL LOW (ref >60)
Glucose, Bld: 201 mg/dL — ABNORMAL HIGH (ref 70–99)
Potassium: 4.6 mmol/L (ref 3.5–5.1)
Sodium: 140 mmol/L (ref 135–145)
Total Bilirubin: 0.6 mg/dL (ref 0.3–1.2)
Total Protein: 6.2 g/dL — ABNORMAL LOW (ref 6.5–8.1)

## 2019-09-28 LAB — CBC WITH DIFFERENTIAL/PLATELET
Abs Immature Granulocytes: 0.06 10*3/uL (ref 0.00–0.07)
Basophils Absolute: 0 10*3/uL (ref 0.0–0.1)
Basophils Relative: 0 %
Eosinophils Absolute: 0 10*3/uL (ref 0.0–0.5)
Eosinophils Relative: 0 %
HCT: 38.5 % (ref 36.0–46.0)
Hemoglobin: 12.8 g/dL (ref 12.0–15.0)
Immature Granulocytes: 1 %
Lymphocytes Relative: 9 %
Lymphs Abs: 0.7 10*3/uL (ref 0.7–4.0)
MCH: 30.5 pg (ref 26.0–34.0)
MCHC: 33.2 g/dL (ref 30.0–36.0)
MCV: 91.7 fL (ref 80.0–100.0)
Monocytes Absolute: 0.5 10*3/uL (ref 0.1–1.0)
Monocytes Relative: 5 %
Neutro Abs: 7.4 10*3/uL (ref 1.7–7.7)
Neutrophils Relative %: 85 %
Platelets: 321 10*3/uL (ref 150–400)
RBC: 4.2 MIL/uL (ref 3.87–5.11)
RDW: 12.3 % (ref 11.5–15.5)
WBC: 8.6 10*3/uL (ref 4.0–10.5)
nRBC: 0 % (ref 0.0–0.2)

## 2019-09-28 LAB — FERRITIN: Ferritin: 442 ng/mL — ABNORMAL HIGH (ref 11–307)

## 2019-09-28 LAB — MAGNESIUM: Magnesium: 2.8 mg/dL — ABNORMAL HIGH (ref 1.7–2.4)

## 2019-09-28 LAB — GLUCOSE, CAPILLARY
Glucose-Capillary: 174 mg/dL — ABNORMAL HIGH (ref 70–99)
Glucose-Capillary: 192 mg/dL — ABNORMAL HIGH (ref 70–99)
Glucose-Capillary: 201 mg/dL — ABNORMAL HIGH (ref 70–99)
Glucose-Capillary: 223 mg/dL — ABNORMAL HIGH (ref 70–99)
Glucose-Capillary: 229 mg/dL — ABNORMAL HIGH (ref 70–99)
Glucose-Capillary: 253 mg/dL — ABNORMAL HIGH (ref 70–99)

## 2019-09-28 LAB — FIBRIN DERIVATIVES D-DIMER (ARMC ONLY): Fibrin derivatives D-dimer (ARMC): 1121.17 ng/mL (FEU) — ABNORMAL HIGH (ref 0.00–499.00)

## 2019-09-28 LAB — C-REACTIVE PROTEIN: CRP: 4.8 mg/dL — ABNORMAL HIGH (ref <1.0)

## 2019-09-28 MED ORDER — INSULIN ASPART 100 UNIT/ML ~~LOC~~ SOLN
0.0000 [IU] | Freq: Three times a day (TID) | SUBCUTANEOUS | Status: DC
  Administered 2019-09-28: 5 [IU] via SUBCUTANEOUS
  Administered 2019-09-29 (×2): 2 [IU] via SUBCUTANEOUS
  Administered 2019-09-29: 3 [IU] via SUBCUTANEOUS
  Administered 2019-09-30: 2 [IU] via SUBCUTANEOUS
  Administered 2019-09-30 (×2): 3 [IU] via SUBCUTANEOUS
  Administered 2019-10-01 (×2): 2 [IU] via SUBCUTANEOUS
  Administered 2019-10-01: 5 [IU] via SUBCUTANEOUS
  Administered 2019-10-02: 2 [IU] via SUBCUTANEOUS
  Administered 2019-10-02: 5 [IU] via SUBCUTANEOUS
  Administered 2019-10-02: 18:00:00 3 [IU] via SUBCUTANEOUS
  Administered 2019-10-03 (×2): 1 [IU] via SUBCUTANEOUS
  Filled 2019-09-28 (×16): qty 1

## 2019-09-28 MED ORDER — SODIUM CHLORIDE 0.9 % IV SOLN
INTRAVENOUS | Status: DC | PRN
  Administered 2019-09-28: 500 mL via INTRAVENOUS

## 2019-09-28 MED ORDER — INSULIN ASPART 100 UNIT/ML ~~LOC~~ SOLN
6.0000 [IU] | Freq: Three times a day (TID) | SUBCUTANEOUS | Status: DC
  Administered 2019-09-29 – 2019-10-02 (×11): 6 [IU] via SUBCUTANEOUS
  Filled 2019-09-28 (×12): qty 1

## 2019-09-28 NOTE — Progress Notes (Signed)
PROGRESS NOTE    Lori Berger  [id number] DOB: 1940-12-11 DOA: 09/25/2019  PCP: Jerrol Banana., MD    LOS - 3   Brief Narrative:  78 y.o.femalewith medical history significant ofhypertension, diabetes, arthritis, hyperlipidemia, depression. She presented to the ED from her SNF after an apparent fall, after beingin the EDday before alsofor a fallat which timeall imaging that time was negative. Per report, SNF staff reported to the EMS that patient had a near fall today, did not actually hit the ground.Patient is known recently tested positive for COVID-19. This visit, patienthypoxic requiring 2 L/min of oxygen to maintain her O2 sat over 90%. She denies respiratory or GI symptoms, but reportsfatigued and generally weak recently.In the ED,O2 sat 87% on room air, improved to mid 90s on 2 L/min oxygen. BP92/45. Tachypneic, tachycardic. Chest x-ray showed patchy densities in both lung bases consistent with atelectasis or pneumonia. Admitted to monitored bed as inpatient for further managementof acute hypoxic respiratory failure secondary to Covid-19Pneumonia.  Subjective 12/25: Patient sitting up in bed, very little breakfast eaten.  Says she feels terrible, asks why not getting better yet.  Says she is not any better.  Is currently on room air which I assured her is an improvement.  Denies fever/chills, N/V/D, chest pain or SOB.  No acute events reported.  Assessment & Plan:   Principal Problem:   Pneumonia due to COVID-19 virus Active Problems:   Acute respiratory failure with hypoxia (HCC)   Diabetes mellitus, type 2 (HCC)   Essential (primary) hypertension   Acute lower UTI   Acute respiratory failure with hypoxia secondary to COVID-19 pneumonia Hypoxia improving, on room air this AM but has been on 2 L/min with sats upper 90's. --Supplemental oxygen, maintain O2 sat>90% -Remdesivir per pharmacy protocol - last day 12/26 -Decadron IV  -Vitamin C  and zinc -Airborne and contact precautions -Combivent inhaler -Antitussives  Acute lower UTI, present on admission Urine culture of 12/17 reviewed, this grew staph epidermidis sensitive to Bactrim. Had just been started on Bactrim outpatient. --completed course of Bactrim  Type 2 diabetes Home regimen is Invokana-Metformin. --Sliding scale sensitive Novolog  Essential hypertension-with borderline low blood pressure on admission --Hold home amlodipine and lisinopril for now --Monitor blood pressure closely and resume meds as indicated  Coronary artery disease-stable -Continue aspirin and Plavix  Hyperlipidemia --Continue simvastatin  History of breast and endometrial cancer --Continue home letrozole   DVT prophylaxis:Lovenox Code Status: DNR Family Communication:son and daughter-in-law updated by phone Disposition Plan: Expect dc in 2-3 days, pending 5th day remdesivir (12/26).  Likely to need rehab/SNF for rehab.   Consultants:  none  Procedures:  none  Antimicrobials:  none   Objective: Vitals:   09/27/19 0504 09/27/19 1348 09/27/19 2011 09/28/19 0432  BP: (!) 113/54 (!) 114/57 104/61 (!) 145/88  Pulse: 80 90 79 74  Resp:  20 17 17   Temp:  98.1 F (36.7 C) 98.2 F (36.8 C) 97.6 F (36.4 C)  TempSrc:  Oral  Oral  SpO2: 99% 96% 98% 97%  Weight:      Height:        Intake/Output Summary (Last 24 hours) at 09/28/2019 0729 Last data filed at 09/27/2019 2015 Gross per 24 hour  Intake --  Output 500 ml  Net -500 ml   Filed Weights   09/25/19 2106  Weight: 74.8 kg    Examination:  General exam: awake, alert, no acute distress Respiratory system: clear to auscultation bilaterally, no wheezes, rales  or rhonchi, normal respiratory effort. Cardiovascular system: normal S1/S2, RRR, no JVD, murmurs, rubs, gallops, no pedal edema.   Central nervous system: alert and oriented x3. no gross focal neurologic deficits, normal  speech Extremities: moves all, no edema, normal tone Skin: dry, intact, normal temperature Psychiatry: anxious mood, congruent affect    Data Reviewed: I have personally reviewed following labs and imaging studies  CBC: Recent Labs  Lab 09/24/19 0930 09/25/19 1241 09/26/19 0606 09/27/19 0429 09/28/19 0611  WBC 6.7 11.2* 4.8 8.7 8.6  NEUTROABS  --  9.3* 4.0 7.4 7.4  HGB 14.5 14.6 13.8 13.4 12.8  HCT 43.8 43.8 41.0 40.3 38.5  MCV 90.7 92.0 90.1 91.6 91.7  PLT 188 258 240 303 [id number]   Basic Metabolic Panel: Recent Labs  Lab 09/24/19 0930 09/25/19 1241 09/26/19 0606 09/27/19 0429 09/28/19 0611  NA 137 137 139 142 140  K 3.9 4.0 5.2* 4.5 4.6  CL 102 104 108 110 112*  CO2 20* 18* 15* 18* 17*  GLUCOSE 173* 202* 198* 221* 201*  BUN 42* 38* 49* 64* 65*  CREATININE 1.03* 1.06* 0.96 1.11* 0.92  CALCIUM 9.1 9.1 9.1 9.2 9.1  MG  --   --  2.7* 2.9* 2.8*   GFR: Estimated Creatinine Clearance: 48.3 mL/min (by C-G formula based on SCr of 0.92 mg/dL). Liver Function Tests: Recent Labs  Lab 09/24/19 0930 09/25/19 1241 09/26/19 0606 09/27/19 0429 09/28/19 0611  AST 34 32 17 17 23   ALT 18 16 14 15 19   ALKPHOS 56 68 54 61 56  BILITOT 0.8 1.0 0.8 0.6 0.6  PROT 7.0 7.2 6.7 6.6 6.2*  ALBUMIN 3.6 3.7 3.1* 3.1* 3.0*   No results for input(s): LIPASE, AMYLASE in the last 168 hours. No results for input(s): AMMONIA in the last 168 hours. Coagulation Profile: No results for input(s): INR, PROTIME in the last 168 hours. Cardiac Enzymes: No results for input(s): CKTOTAL, CKMB, CKMBINDEX, TROPONINI in the last 168 hours. BNP (last 3 results) No results for input(s): PROBNP in the last 8760 hours. HbA1C: Recent Labs    09/25/19 1905 09/26/19 0932  HGBA1C 7.1* 6.5*   CBG: Recent Labs  Lab 09/27/19 1206 09/27/19 1632 09/27/19 2043 09/28/19 0048 09/28/19 0447  GLUCAP 265* 278* 274* 223* 192*   Lipid Profile: No results for input(s): CHOL, HDL, LDLCALC, TRIG, CHOLHDL,  LDLDIRECT in the last 72 hours. Thyroid Function Tests: No results for input(s): TSH, T4TOTAL, FREET4, T3FREE, THYROIDAB in the last 72 hours. Anemia Panel: Recent Labs    09/27/19 0429 09/28/19 0611  FERRITIN 468* 442*   Sepsis Labs: Recent Labs  Lab 09/25/19 1241 09/25/19 1329 09/25/19 1507  PROCALCITON 0.14  --   --   LATICACIDVEN  --  1.8 1.6    Recent Results (from the past 240 hour(s))  Urine Culture     Status: Abnormal   Collection Time: 09/20/19 11:20 AM   Specimen: Urine   UR  Result Value Ref Range Status   Urine Culture, Routine Final report (A)  Final   Organism ID, Bacteria Comment (A)  Final    Comment: Staphylococcus epidermidis Greater than 100,000 colony forming units per mL Based on resistance to oxacillin this isolate would be resistant to all currently available beta-lactam antimicrobial agents, with the exception of the newer cephalosporins with anti-MRSA activity, such as Ceftaroline    Antimicrobial Susceptibility Comment  Final    Comment:       ** S = Susceptible; I = Intermediate;  R = Resistant **                    P = Positive; N = Negative             MICS are expressed in micrograms per mL    Antibiotic                 RSLT#1    RSLT#2    RSLT#3    RSLT#4 Ciprofloxacin                  S Gentamicin                     S Levofloxacin                   S Linezolid                      S Nitrofurantoin                 S Oxacillin                      R Penicillin                     R Quinupristin/Dalfopristin      S Rifampin                       S Tetracycline                   R Trimethoprim/Sulfa             S Vancomycin                     S   Blood Culture (routine x 2)     Status: None (Preliminary result)   Collection Time: 09/25/19  1:29 PM   Specimen: BLOOD  Result Value Ref Range Status   Specimen Description BLOOD BLOOD RIGHT WRIST  Final   Special Requests   Final    BOTTLES DRAWN AEROBIC AND ANAEROBIC Blood Culture  adequate volume   Culture   Final    NO GROWTH 3 DAYS Performed at [facility], [address], [place], [place] [address]    Report Status PENDING  Incomplete  Blood Culture (routine x 2)     Status: None (Preliminary result)   Collection Time: 09/25/19  1:29 PM   Specimen: BLOOD  Result Value Ref Range Status   Specimen Description BLOOD RIGHT ANTECUBITAL  Final   Special Requests   Final    BOTTLES DRAWN AEROBIC AND ANAEROBIC Blood Culture adequate volume   Culture   Final    NO GROWTH 3 DAYS Performed at [facility], [address] [place], [place] [address]    Report Status PENDING  Incomplete  MRSA PCR Screening     Status: None   Collection Time: 09/26/19 12:59 AM   Specimen: Nasopharyngeal  Result Value Ref Range Status   MRSA by PCR NEGATIVE NEGATIVE Final    Comment:        The GeneXpert MRSA Assay (FDA approved for NASAL specimens only), is one component of a comprehensive MRSA colonization surveillance program. It is not intended to diagnose MRSA infection nor to guide or monitor treatment for MRSA infections. Performed at [facility], [address], [place], [place] [address]  Radiology Studies: No results found.      Scheduled Meds: . vitamin C  500 mg Oral Daily  . aspirin EC  81 mg Oral Daily  . clopidogrel  75 mg Oral Daily  . dexamethasone (DECADRON) injection  6 mg Intravenous Q24H  . enoxaparin (LOVENOX) injection  40 mg Subcutaneous Q24H  . feeding supplement (NEPRO CARB STEADY)  237 mL Oral TID BM  . insulin aspart  0-9 Units Subcutaneous Q4H  . Ipratropium-Albuterol  1 puff Inhalation Q6H  . letrozole  2.5 mg Oral Daily  . multivitamin with minerals  1 tablet Oral Daily  . nystatin cream  1 application Topical BID  . simvastatin  10 mg Oral QHS  . sulfamethoxazole-trimethoprim  1 tablet Oral Q12H  . zinc sulfate  220 mg Oral Daily   Continuous Infusions: . remdesivir 100 mg in NS 100 mL  100 mg (09/27/19 0905)     LOS: 3 days    Time spent: 30-35 minutes    Ezekiel Slocumb, DO [facility]   If 7PM-7AM, please contact night-coverage [email or web address] Password [facility] 09/28/2019, 7:29 AM

## 2019-09-29 LAB — CBC WITH DIFFERENTIAL/PLATELET
Abs Immature Granulocytes: 0.11 10*3/uL — ABNORMAL HIGH (ref 0.00–0.07)
Basophils Absolute: 0 10*3/uL (ref 0.0–0.1)
Basophils Relative: 0 %
Eosinophils Absolute: 0 10*3/uL (ref 0.0–0.5)
Eosinophils Relative: 0 %
HCT: 39.4 % (ref 36.0–46.0)
Hemoglobin: 13.1 g/dL (ref 12.0–15.0)
Immature Granulocytes: 1 %
Lymphocytes Relative: 12 %
Lymphs Abs: 1.1 10*3/uL (ref 0.7–4.0)
MCH: 30.6 pg (ref 26.0–34.0)
MCHC: 33.2 g/dL (ref 30.0–36.0)
MCV: 92.1 fL (ref 80.0–100.0)
Monocytes Absolute: 0.6 10*3/uL (ref 0.1–1.0)
Monocytes Relative: 7 %
Neutro Abs: 7.1 10*3/uL (ref 1.7–7.7)
Neutrophils Relative %: 80 %
Platelets: 322 10*3/uL (ref 150–400)
RBC: 4.28 MIL/uL (ref 3.87–5.11)
RDW: 12.4 % (ref 11.5–15.5)
WBC: 9 10*3/uL (ref 4.0–10.5)
nRBC: 0 % (ref 0.0–0.2)

## 2019-09-29 LAB — COMPREHENSIVE METABOLIC PANEL
ALT: 17 U/L (ref 0–44)
AST: 17 U/L (ref 15–41)
Albumin: 3.1 g/dL — ABNORMAL LOW (ref 3.5–5.0)
Alkaline Phosphatase: 58 U/L (ref 38–126)
Anion gap: 9 (ref 5–15)
BUN: 62 mg/dL — ABNORMAL HIGH (ref 8–23)
CO2: 21 mmol/L — ABNORMAL LOW (ref 22–32)
Calcium: 9.1 mg/dL (ref 8.9–10.3)
Chloride: 112 mmol/L — ABNORMAL HIGH (ref 98–111)
Creatinine, Ser: 0.93 mg/dL (ref 0.44–1.00)
GFR calc Af Amer: >60 mL/min (ref >60)
GFR calc non Af Amer: 58 mL/min — ABNORMAL LOW (ref >60)
Glucose, Bld: 213 mg/dL — ABNORMAL HIGH (ref 70–99)
Potassium: 5 mmol/L (ref 3.5–5.1)
Sodium: 142 mmol/L (ref 135–145)
Total Bilirubin: 0.6 mg/dL (ref 0.3–1.2)
Total Protein: 6.5 g/dL (ref 6.5–8.1)

## 2019-09-29 LAB — FERRITIN: Ferritin: 437 ng/mL — ABNORMAL HIGH (ref 11–307)

## 2019-09-29 LAB — MAGNESIUM: Magnesium: 2.8 mg/dL — ABNORMAL HIGH (ref 1.7–2.4)

## 2019-09-29 LAB — GLUCOSE, CAPILLARY
Glucose-Capillary: 172 mg/dL — ABNORMAL HIGH (ref 70–99)
Glucose-Capillary: 193 mg/dL — ABNORMAL HIGH (ref 70–99)
Glucose-Capillary: 228 mg/dL — ABNORMAL HIGH (ref 70–99)
Glucose-Capillary: 154 mg/dL — ABNORMAL HIGH (ref 70–99)

## 2019-09-29 LAB — FIBRIN DERIVATIVES D-DIMER (ARMC ONLY): Fibrin derivatives D-dimer (ARMC): 996.41 ng/mL (FEU) — ABNORMAL HIGH (ref 0.00–499.00)

## 2019-09-29 LAB — C-REACTIVE PROTEIN: CRP: 2.6 mg/dL — ABNORMAL HIGH (ref <1.0)

## 2019-09-29 NOTE — Progress Notes (Addendum)
Paged Ouma re: patient's family member, Tyannah Takacs, wants an update and the plan of care. [phone]  Ouma asked me to inform day shift RN to have patient's attending give Mariann Laster a call today to update her about the plan of care.

## 2019-09-29 NOTE — Progress Notes (Signed)
PROGRESS NOTE    Lori Berger  [id number] DOB: 01/25/1941 DOA: 09/25/2019  PCP: Jerrol Banana., MD    LOS - 4   Brief Narrative:  78 y.o.femalewith medical history significant ofhypertension, diabetes, arthritis, hyperlipidemia, depression. She presented to the ED from her SNF after an apparent fall, after beingin the EDday before alsofor a fallat which timeall imaging that time was negative. Per report, SNF staff reported to the EMS that patient had a near fall today, did not actually hit the ground.Patient is known recently tested positive for COVID-19. This visit, patienthypoxic requiring 2 L/min of oxygen to maintain her O2 sat over 90%. She denies respiratory or GI symptoms, but reportsfatigued and generally weak recently.In the ED,O2 sat 87% on room air, improved to mid 90s on 2 L/min oxygen. BP92/45. Tachypneic, tachycardic. Chest x-ray showed patchy densities in both lung bases consistent with atelectasis or pneumonia. Admitted to monitored bed as inpatient for further managementof acute hypoxic respiratory failure secondary to Covid-19Pneumonia.  Subjective 12/26: Patient seen this morning, after eating breakfast.  She says she feels a lot better today, much better than yesterday.  Denies fevers or chills, chest pain, shortness of breath, nausea vomiting or diarrhea.  No acute events reported overnight.  Assessment & Plan:   Principal Problem:   Pneumonia due to COVID-19 virus Active Problems:   Acute respiratory failure with hypoxia (HCC)   Diabetes mellitus, type 2 (HCC)   Essential (primary) hypertension   Acute lower UTI   Acute respiratory failure with hypoxia secondary to COVID-19 pneumonia Hypoxia resolved, had been requiring 2 L/min with sats upper 90's. --Supplemental oxygen PRN, maintain O2 sat>90% -Remdesivir per pharmacy protocol - last day 12/26 -will continue Decadron IV until discharge since she was hypoxic -Vitamin C  and zinc -Airborne and contact precautions -Combivent inhaler -Antitussives  Acute lower UTI, present on admission Urine culture of 12/17 reviewed, this grew staph epidermidis sensitive to Bactrim. Had just been started on Bactrim outpatient. --completed course of Bactrim  Type 2 diabetes Home regimen is Invokana-Metformin. --Sliding scale sensitive Novolog  Essential hypertension-with borderline low blood pressure on admission --Hold home amlodipine and lisinopril for now --Monitor blood pressure closely and resume meds as indicated  Coronary artery disease-stable -Continue aspirin and Plavix  Hyperlipidemia --Continue simvastatin  History of breast and endometrial cancer --Continue home letrozole   DVT prophylaxis:Lovenox Code Status: DNR Family Communication:son updated by phone  Disposition Plan:Pending SNF/rehab placement.    Consultants:  none  Procedures:  none  Antimicrobials:  none    Objective: Vitals:   09/28/19 1139 09/28/19 1953 09/29/19 0447 09/29/19 0447  BP: (!) 108/49 104/61 116/72   Pulse: 79 87 81 86  Resp: 16 18 18    Temp: 98 F (36.7 C) 97.9 F (36.6 C) 97.6 F (36.4 C)   TempSrc: Oral Oral Oral   SpO2: 92% 95% 93% 96%  Weight:      Height:        Intake/Output Summary (Last 24 hours) at 09/29/2019 0736 Last data filed at 09/29/2019 [id number] Gross per 24 hour  Intake 243.39 ml  Output 0 ml  Net 243.39 ml   Filed Weights   09/25/19 2106  Weight: 74.8 kg    Examination:  General exam: awake, alert, no acute distress HEENT: moist mucus membranes, hearing grossly normal  Respiratory system: clear to auscultation bilaterally, no wheezes, rales or rhonchi, normal respiratory effort. Cardiovascular system: normal S1/S2, RRR, no JVD, murmurs, rubs, gallops, no pedal edema.  Gastrointestinal system: soft, non-tender, non-distended abdomen, normal bowel sounds. Central nervous system: alert and  oriented x3. no gross focal neurologic deficits, normal speech Extremities: moves all, no cyanosis, normal tone Skin: dry, intact, normal temperature,  Psychiatry: normal mood, congruent affect   Data Reviewed: I have personally reviewed following labs and imaging studies  CBC: Recent Labs  Lab 09/25/19 1241 09/26/19 0606 09/27/19 0429 09/28/19 0611 09/29/19 0550  WBC 11.2* 4.8 8.7 8.6 9.0  NEUTROABS 9.3* 4.0 7.4 7.4 7.1  HGB 14.6 13.8 13.4 12.8 13.1  HCT 43.8 41.0 40.3 38.5 39.4  MCV 92.0 90.1 91.6 91.7 92.1  PLT 258 240 303 321 [id number]   Basic Metabolic Panel: Recent Labs  Lab 09/25/19 1241 09/26/19 0606 09/27/19 0429 09/28/19 0611 09/29/19 0550  NA 137 139 142 140 142  K 4.0 5.2* 4.5 4.6 5.0  CL 104 108 110 112* 112*  CO2 18* 15* 18* 17* 21*  GLUCOSE 202* 198* 221* 201* 213*  BUN 38* 49* 64* 65* 62*  CREATININE 1.06* 0.96 1.11* 0.92 0.93  CALCIUM 9.1 9.1 9.2 9.1 9.1  MG  --  2.7* 2.9* 2.8* 2.8*   GFR: Estimated Creatinine Clearance: 47.8 mL/min (by C-G formula based on SCr of 0.93 mg/dL). Liver Function Tests: Recent Labs  Lab 09/25/19 1241 09/26/19 0606 09/27/19 0429 09/28/19 0611 09/29/19 0550  AST 32 17 17 23 17   ALT 16 14 15 19 17   ALKPHOS 68 54 61 56 58  BILITOT 1.0 0.8 0.6 0.6 0.6  PROT 7.2 6.7 6.6 6.2* 6.5  ALBUMIN 3.7 3.1* 3.1* 3.0* 3.1*   No results for input(s): LIPASE, AMYLASE in the last 168 hours. No results for input(s): AMMONIA in the last 168 hours. Coagulation Profile: No results for input(s): INR, PROTIME in the last 168 hours. Cardiac Enzymes: No results for input(s): CKTOTAL, CKMB, CKMBINDEX, TROPONINI in the last 168 hours. BNP (last 3 results) No results for input(s): PROBNP in the last 8760 hours. HbA1C: Recent Labs    09/26/19 0932  HGBA1C 6.5*   CBG: Recent Labs  Lab 09/28/19 0447 09/28/19 0800 09/28/19 1136 09/28/19 1705 09/28/19 2136  GLUCAP 192* 174* 201* 253* 229*   Lipid Profile: No results for input(s):  CHOL, HDL, LDLCALC, TRIG, CHOLHDL, LDLDIRECT in the last 72 hours. Thyroid Function Tests: No results for input(s): TSH, T4TOTAL, FREET4, T3FREE, THYROIDAB in the last 72 hours. Anemia Panel: Recent Labs    09/28/19 0611 09/29/19 0550  FERRITIN 442* 437*   Sepsis Labs: Recent Labs  Lab 09/25/19 1241 09/25/19 1329 09/25/19 1507  PROCALCITON 0.14  --   --   LATICACIDVEN  --  1.8 1.6    Recent Results (from the past 240 hour(s))  Urine Culture     Status: Abnormal   Collection Time: 09/20/19 11:20 AM   Specimen: Urine   UR  Result Value Ref Range Status   Urine Culture, Routine Final report (A)  Final   Organism ID, Bacteria Comment (A)  Final    Comment: Staphylococcus epidermidis Greater than 100,000 colony forming units per mL Based on resistance to oxacillin this isolate would be resistant to all currently available beta-lactam antimicrobial agents, with the exception of the newer cephalosporins with anti-MRSA activity, such as Ceftaroline    Antimicrobial Susceptibility Comment  Final    Comment:       ** S = Susceptible; I = Intermediate; R = Resistant **  P = Positive; N = Negative             MICS are expressed in micrograms per mL    Antibiotic                 RSLT#1    RSLT#2    RSLT#3    RSLT#4 Ciprofloxacin                  S Gentamicin                     S Levofloxacin                   S Linezolid                      S Nitrofurantoin                 S Oxacillin                      R Penicillin                     R Quinupristin/Dalfopristin      S Rifampin                       S Tetracycline                   R Trimethoprim/Sulfa             S Vancomycin                     S   Blood Culture (routine x 2)     Status: None (Preliminary result)   Collection Time: 09/25/19  1:29 PM   Specimen: BLOOD  Result Value Ref Range Status   Specimen Description BLOOD BLOOD RIGHT WRIST  Final   Special Requests   Final    BOTTLES DRAWN  AEROBIC AND ANAEROBIC Blood Culture adequate volume   Culture   Final    NO GROWTH 3 DAYS Performed at [facility], [address], [place], [place] [address]    Report Status PENDING  Incomplete  Blood Culture (routine x 2)     Status: None (Preliminary result)   Collection Time: 09/25/19  1:29 PM   Specimen: BLOOD  Result Value Ref Range Status   Specimen Description BLOOD RIGHT ANTECUBITAL  Final   Special Requests   Final    BOTTLES DRAWN AEROBIC AND ANAEROBIC Blood Culture adequate volume   Culture   Final    NO GROWTH 3 DAYS Performed at [facility], [address] [place], [place] [address]    Report Status PENDING  Incomplete  MRSA PCR Screening     Status: None   Collection Time: 09/26/19 12:59 AM   Specimen: Nasopharyngeal  Result Value Ref Range Status   MRSA by PCR NEGATIVE NEGATIVE Final    Comment:        The GeneXpert MRSA Assay (FDA approved for NASAL specimens only), is one component of a comprehensive MRSA colonization surveillance program. It is not intended to diagnose MRSA infection nor to guide or monitor treatment for MRSA infections. Performed at [facility], [address] [place], [place] [address]          Radiology Studies: No results found.      Scheduled Meds: . vitamin C  500 mg Oral Daily  . aspirin EC  81 mg Oral Daily  . clopidogrel  75 mg Oral Daily  . dexamethasone (DECADRON) injection  6 mg Intravenous Q24H  . enoxaparin (LOVENOX) injection  40 mg Subcutaneous Q24H  . feeding supplement (NEPRO CARB STEADY)  237 mL Oral TID BM  . insulin aspart  0-9 Units Subcutaneous TID WC  . insulin aspart  6 Units Subcutaneous TID WC  . Ipratropium-Albuterol  1 puff Inhalation Q6H  . letrozole  2.5 mg Oral Daily  . multivitamin with minerals  1 tablet Oral Daily  . nystatin cream  1 application Topical BID  . simvastatin  10 mg Oral QHS  . zinc sulfate  220 mg Oral Daily   Continuous  Infusions: . sodium chloride Stopped (09/28/19 1737)  . remdesivir 100 mg in NS 100 mL Stopped (09/28/19 0930)     LOS: 4 days    Time spent: 30 to 35 minutes    Ezekiel Slocumb, DO [facility]   If 7PM-7AM, please contact night-coverage [email or web address] Password [email or web address] 09/29/2019, 7:36 AM

## 2019-09-30 LAB — CULTURE, BLOOD (ROUTINE X 2)
Culture: NO GROWTH
Culture: NO GROWTH
Special Requests: ADEQUATE
Special Requests: ADEQUATE

## 2019-09-30 LAB — CBC WITH DIFFERENTIAL/PLATELET
Abs Immature Granulocytes: 0.11 10*3/uL — ABNORMAL HIGH (ref 0.00–0.07)
Basophils Absolute: 0 10*3/uL (ref 0.0–0.1)
Basophils Relative: 0 %
Eosinophils Absolute: 0 10*3/uL (ref 0.0–0.5)
Eosinophils Relative: 0 %
HCT: 38.8 % (ref 36.0–46.0)
Hemoglobin: 13 g/dL (ref 12.0–15.0)
Immature Granulocytes: 2 %
Lymphocytes Relative: 17 %
Lymphs Abs: 1.3 10*3/uL (ref 0.7–4.0)
MCH: 30.2 pg (ref 26.0–34.0)
MCHC: 33.5 g/dL (ref 30.0–36.0)
MCV: 90 fL (ref 80.0–100.0)
Monocytes Absolute: 0.5 10*3/uL (ref 0.1–1.0)
Monocytes Relative: 7 %
Neutro Abs: 5.5 10*3/uL (ref 1.7–7.7)
Neutrophils Relative %: 74 %
Platelets: 306 10*3/uL (ref 150–400)
RBC: 4.31 MIL/uL (ref 3.87–5.11)
RDW: 12.2 % (ref 11.5–15.5)
WBC: 7.4 10*3/uL (ref 4.0–10.5)
nRBC: 0 % (ref 0.0–0.2)

## 2019-09-30 LAB — COMPREHENSIVE METABOLIC PANEL
ALT: 19 U/L (ref 0–44)
AST: 18 U/L (ref 15–41)
Albumin: 2.9 g/dL — ABNORMAL LOW (ref 3.5–5.0)
Alkaline Phosphatase: 54 U/L (ref 38–126)
Anion gap: 10 (ref 5–15)
BUN: 55 mg/dL — ABNORMAL HIGH (ref 8–23)
CO2: 21 mmol/L — ABNORMAL LOW (ref 22–32)
Calcium: 8.8 mg/dL — ABNORMAL LOW (ref 8.9–10.3)
Chloride: 109 mmol/L (ref 98–111)
Creatinine, Ser: 0.83 mg/dL (ref 0.44–1.00)
GFR calc Af Amer: >60 mL/min (ref >60)
GFR calc non Af Amer: >60 mL/min (ref >60)
Glucose, Bld: 161 mg/dL — ABNORMAL HIGH (ref 70–99)
Potassium: 5 mmol/L (ref 3.5–5.1)
Sodium: 140 mmol/L (ref 135–145)
Total Bilirubin: 0.6 mg/dL (ref 0.3–1.2)
Total Protein: 5.9 g/dL — ABNORMAL LOW (ref 6.5–8.1)

## 2019-09-30 LAB — C-REACTIVE PROTEIN: CRP: 1.2 mg/dL — ABNORMAL HIGH (ref <1.0)

## 2019-09-30 LAB — GLUCOSE, CAPILLARY
Glucose-Capillary: 160 mg/dL — ABNORMAL HIGH (ref 70–99)
Glucose-Capillary: 229 mg/dL — ABNORMAL HIGH (ref 70–99)
Glucose-Capillary: 204 mg/dL — ABNORMAL HIGH (ref 70–99)
Glucose-Capillary: 198 mg/dL — ABNORMAL HIGH (ref 70–99)

## 2019-09-30 LAB — FERRITIN: Ferritin: 406 ng/mL — ABNORMAL HIGH (ref 11–307)

## 2019-09-30 LAB — MAGNESIUM: Magnesium: 2.6 mg/dL — ABNORMAL HIGH (ref 1.7–2.4)

## 2019-09-30 LAB — FIBRIN DERIVATIVES D-DIMER (ARMC ONLY): Fibrin derivatives D-dimer (ARMC): 817.1 ng/mL (FEU) — ABNORMAL HIGH (ref 0.00–499.00)

## 2019-09-30 MED ORDER — SODIUM CHLORIDE 0.9 % IV BOLUS
250.0000 mL | Freq: Once | INTRAVENOUS | Status: AC
  Administered 2019-09-30: 250 mL via INTRAVENOUS

## 2019-09-30 NOTE — Progress Notes (Addendum)
PROGRESS NOTE    Lori Berger  [id number] DOB: 05-Aug-1941 DOA: 09/25/2019  PCP: Jerrol Banana., MD    LOS - 5   Brief Narrative:  78 y.o.femalewith medical history significant ofhypertension, diabetes, arthritis, hyperlipidemia, depression. She presented to the ED from her SNF after an apparent fall, after beingin the EDday before alsofor a fallat which timeall imaging that time was negative. Per report, SNF staff reported to the EMS that patient had a near fall today, did not actually hit the ground.Patient is known recently tested positive for COVID-19. This visit, patienthypoxic requiring 2 L/min of oxygen to maintain her O2 sat over 90%. She denies respiratory or GI symptoms, but reportsfatigued and generally weak recently.In the ED,O2 sat 87% on room air, improved to mid 90s on 2 L/min oxygen. BP92/45. Tachypneic, tachycardic. Chest x-ray showed patchy densities in both lung bases consistent with atelectasis or pneumonia. Admitted to monitored bed as inpatient for further managementof acute hypoxic respiratory failure secondary to Covid-19Pneumonia.  Subjective 12/26:  No acute events reported overnight or this AM. Awaiting SNF placement.   Assessment & Plan:   Principal Problem:   Pneumonia due to COVID-19 virus Active Problems:   Diabetes mellitus, type 2 ([place])   Essential (primary) hypertension   Acute lower UTI   Acute respiratory failure with hypoxia (HCC)   Acute respiratory failure with hypoxia secondary to COVID-19 pneumonia Hypoxia resolved, had been requiring 2 L/min with sats upper 90's. --Supplemental oxygen PRN only now, maintain O2 sat>90% -S/P Remdesivir per pharmacy protocol - last day 12/26 - continue Decadron IV until discharge (may treat for a total of 10 days) since she was hypoxic -Vitamin C and zinc -Airborne and contact precautions -Combivent inhaler -Antitussives  Acute lower UTI, present on admission: No  acute issues  Urine culture of 12/17 reviewed, this grew staph epidermidis sensitive to Bactrim. Had just been started on Bactrim outpatient. --completed course of Bactrim  Type 2 diabetes Home regimen is Invokana-Metformin. --Sliding scale sensitive Novolog  Essential hypertension- But currently running low to low normal range -Cont to hold home amlodipine and lisinopril for now - May give Gama ivF bolus  --Monitor blood pressure closely and resume meds as indicated  Coronary artery disease-stable -Continue aspirin and Plavix  Hyperlipidemia --Continue simvastatin  History of breast and endometrial cancer --Continue home letrozole - F/U OP   DVT prophylaxis:Lovenox Code Status: DNR Family Communication:Spoke with daughter. Addressed her concern. Expressed understandings.  Disposition Plan:Pending SNF/rehab placement.    Consultants:  none  Procedures:  none  Antimicrobials:  none    Objective: Vitals:   09/29/19 1154 09/29/19 2115 09/30/19 0526 09/30/19 1044  BP: 104/68 (!) 95/55 112/61 (!) 105/54  Pulse: 90 75 69 74  Resp: 16 18 18 18   Temp: 99.2 F (37.3 C) 97.7 F (36.5 C) 97.6 F (36.4 C) 98 F (36.7 C)  TempSrc: Oral Oral Oral Oral  SpO2: 90% 94% 98% 95%  Weight:      Height:       No intake or output data in the 24 hours ending 09/30/19 1347 Filed Weights   09/25/19 2106  Weight: 74.8 kg    Examination:  General exam: awake, alert, no acute distress HEENT: moist mucus membranes, hearing grossly normal  Respiratory system: clear to auscultation bilaterally, no wheezes, rales or rhonchi, normal respiratory effort. Cardiovascular system: normal S1/S2, RRR, no JVD, murmurs, rubs, gallops, no pedal edema.   Gastrointestinal system: soft, non-tender, non-distended abdomen, normal bowel sounds.  Central nervous system: alert and oriented x3. no gross focal neurologic deficits, normal speech Extremities: moves all, no  cyanosis, normal tone Skin: dry, intact, normal temperature,  Psychiatry: normal mood, congruent affect   Data Reviewed: I have personally reviewed following labs and imaging studies  CBC: Recent Labs  Lab 09/26/19 0606 09/27/19 0429 09/28/19 0611 09/29/19 0550 09/30/19 0718  WBC 4.8 8.7 8.6 9.0 7.4  NEUTROABS 4.0 7.4 7.4 7.1 5.5  HGB 13.8 13.4 12.8 13.1 13.0  HCT 41.0 40.3 38.5 39.4 38.8  MCV 90.1 91.6 91.7 92.1 90.0  PLT 240 303 321 322 [id number]   Basic Metabolic Panel: Recent Labs  Lab 09/26/19 0606 09/27/19 0429 09/28/19 0611 09/29/19 0550 09/30/19 0718  NA 139 142 140 142 140  K 5.2* 4.5 4.6 5.0 5.0  CL 108 110 112* 112* 109  CO2 15* 18* 17* 21* 21*  GLUCOSE 198* 221* 201* 213* 161*  BUN 49* 64* 65* 62* 55*  CREATININE 0.96 1.11* 0.92 0.93 0.83  CALCIUM 9.1 9.2 9.1 9.1 8.8*  MG 2.7* 2.9* 2.8* 2.8* 2.6*   GFR: Estimated Creatinine Clearance: 53.5 mL/min (by C-G formula based on SCr of 0.83 mg/dL). Liver Function Tests: Recent Labs  Lab 09/26/19 0606 09/27/19 0429 09/28/19 0611 09/29/19 0550 09/30/19 0718  AST 17 17 23 17 18   ALT 14 15 19 17 19   ALKPHOS 54 61 56 58 54  BILITOT 0.8 0.6 0.6 0.6 0.6  PROT 6.7 6.6 6.2* 6.5 5.9*  ALBUMIN 3.1* 3.1* 3.0* 3.1* 2.9*   No results for input(s): LIPASE, AMYLASE in the last 168 hours. No results for input(s): AMMONIA in the last 168 hours. Coagulation Profile: No results for input(s): INR, PROTIME in the last 168 hours. Cardiac Enzymes: No results for input(s): CKTOTAL, CKMB, CKMBINDEX, TROPONINI in the last 168 hours. BNP (last 3 results) No results for input(s): PROBNP in the last 8760 hours. HbA1C: No results for input(s): HGBA1C in the last 72 hours. CBG: Recent Labs  Lab 09/29/19 1143 09/29/19 1646 09/29/19 2110 09/30/19 0733 09/30/19 1142  GLUCAP 193* 228* 154* 160* 229*   Lipid Profile: No results for input(s): CHOL, HDL, LDLCALC, TRIG, CHOLHDL, LDLDIRECT in the last 72 hours. Thyroid Function  Tests: No results for input(s): TSH, T4TOTAL, FREET4, T3FREE, THYROIDAB in the last 72 hours. Anemia Panel: Recent Labs    09/29/19 0550 09/30/19 0718  FERRITIN 437* 406*   Sepsis Labs: Recent Labs  Lab 09/25/19 1241 09/25/19 1329 09/25/19 1507  PROCALCITON 0.14  --   --   LATICACIDVEN  --  1.8 1.6    Recent Results (from the past 240 hour(s))  Blood Culture (routine x 2)     Status: None   Collection Time: 09/25/19  1:29 PM   Specimen: BLOOD  Result Value Ref Range Status   Specimen Description BLOOD BLOOD RIGHT WRIST  Final   Special Requests   Final    BOTTLES DRAWN AEROBIC AND ANAEROBIC Blood Culture adequate volume   Culture   Final    NO GROWTH 5 DAYS Performed at [facility], [address] [place], [place] [address]    Report Status 09/30/2019 FINAL  Final  Blood Culture (routine x 2)     Status: None   Collection Time: 09/25/19  1:29 PM   Specimen: BLOOD  Result Value Ref Range Status   Specimen Description BLOOD RIGHT ANTECUBITAL  Final   Special Requests   Final    BOTTLES DRAWN AEROBIC AND ANAEROBIC Blood  Culture adequate volume   Culture   Final    NO GROWTH 5 DAYS Performed at [facility], [address] [place], [place] [address]    Report Status 09/30/2019 FINAL  Final  MRSA PCR Screening     Status: None   Collection Time: 09/26/19 12:59 AM   Specimen: Nasopharyngeal  Result Value Ref Range Status   MRSA by PCR NEGATIVE NEGATIVE Final    Comment:        The GeneXpert MRSA Assay (FDA approved for NASAL specimens only), is one component of a comprehensive MRSA colonization surveillance program. It is not intended to diagnose MRSA infection nor to guide or monitor treatment for MRSA infections. Performed at [facility], [address] [place], [place] [address]          Radiology Studies: No results found.      Scheduled Meds: . vitamin C  500 mg Oral Daily  . aspirin EC  81 mg Oral Daily  .  clopidogrel  75 mg Oral Daily  . dexamethasone (DECADRON) injection  6 mg Intravenous Q24H  . enoxaparin (LOVENOX) injection  40 mg Subcutaneous Q24H  . feeding supplement (NEPRO CARB STEADY)  237 mL Oral TID BM  . insulin aspart  0-9 Units Subcutaneous TID WC  . insulin aspart  6 Units Subcutaneous TID WC  . Ipratropium-Albuterol  1 puff Inhalation Q6H  . letrozole  2.5 mg Oral Daily  . multivitamin with minerals  1 tablet Oral Daily  . nystatin cream  1 application Topical BID  . simvastatin  10 mg Oral QHS  . zinc sulfate  220 mg Oral Daily   Continuous Infusions: . sodium chloride Stopped (09/28/19 1737)     LOS: 5 days    Time spent: 30 to 35 minutes  Thornell Mule, MD [facility]   If 7PM-7AM, please contact night-coverage [email or web address] Password [email or web address] 09/30/2019, 1:47 PM

## 2019-09-30 NOTE — TOC Progression Note (Signed)
Transition of Care [facility]) - Progression Note    Patient Details  Name: Lori Berger MRN: [id number] Date of Birth: [birth year]  [facility] CM/SW Contact  Ross Ludwig,  Phone Number: 09/30/2019, 7:11 PM  Clinical Narrative:    CSW discussed bed offers with patient's daughter Kynley Misher, and she requested to have admissions worker at [facility] in [place] give her a call tomorrow, because she has some questions about SNF placement.  CSW informed patient's daughter Mariann Laster, that because of Covid there are only a few options available for SNF placement.  Patient's family is leaning towards [facility] in [place], [place] informed her that they may not have any availability, family expressed understanding and would like a follow up call tomorrow.  CSW will request [facility] admissions worker to call patient's daughter tomorrow.  CSW to continue to follow patient's progress throughout discharge plannning.       Expected Discharge Plan: Skilled Nursing Facility Barriers to Discharge: Continued Medical Work up  Expected Discharge Plan and Services Expected Discharge Plan: [place]                                               Social Determinants of Health (SDOH) Interventions    Readmission Risk Interventions No flowsheet data found.

## 2019-09-30 NOTE — Progress Notes (Signed)
250 mL bolus dose of NS given for soft BP as ordered.

## 2019-10-01 ENCOUNTER — Encounter: Payer: Self-pay | Admitting: Family Medicine

## 2019-10-01 LAB — BASIC METABOLIC PANEL
Anion gap: 9 (ref 5–15)
BUN: 57 mg/dL — ABNORMAL HIGH (ref 8–23)
CO2: 18 mmol/L — ABNORMAL LOW (ref 22–32)
Calcium: 8.6 mg/dL — ABNORMAL LOW (ref 8.9–10.3)
Chloride: 109 mmol/L (ref 98–111)
Creatinine, Ser: 0.84 mg/dL (ref 0.44–1.00)
GFR calc Af Amer: >60 mL/min (ref >60)
GFR calc non Af Amer: >60 mL/min (ref >60)
Glucose, Bld: 230 mg/dL — ABNORMAL HIGH (ref 70–99)
Potassium: 4.6 mmol/L (ref 3.5–5.1)
Sodium: 136 mmol/L (ref 135–145)

## 2019-10-01 LAB — MAGNESIUM: Magnesium: 2.6 mg/dL — ABNORMAL HIGH (ref 1.7–2.4)

## 2019-10-01 LAB — GLUCOSE, CAPILLARY
Glucose-Capillary: 167 mg/dL — ABNORMAL HIGH (ref 70–99)
Glucose-Capillary: 179 mg/dL — ABNORMAL HIGH (ref 70–99)
Glucose-Capillary: 279 mg/dL — ABNORMAL HIGH (ref 70–99)
Glucose-Capillary: 355 mg/dL — ABNORMAL HIGH (ref 70–99)

## 2019-10-01 LAB — FIBRIN DERIVATIVES D-DIMER (ARMC ONLY): Fibrin derivatives D-dimer (ARMC): 664.3 ng/mL (FEU) — ABNORMAL HIGH (ref 0.00–499.00)

## 2019-10-01 LAB — FERRITIN: Ferritin: 430 ng/mL — ABNORMAL HIGH (ref 11–307)

## 2019-10-01 LAB — C-REACTIVE PROTEIN: CRP: 0.6 mg/dL (ref <1.0)

## 2019-10-01 MED ORDER — INSULIN ASPART 100 UNIT/ML ~~LOC~~ SOLN
0.0000 [IU] | Freq: Every day | SUBCUTANEOUS | Status: DC
  Administered 2019-10-01: 22:00:00 5 [IU] via SUBCUTANEOUS
  Administered 2019-10-02: 3 [IU] via SUBCUTANEOUS
  Filled 2019-10-01: qty 1

## 2019-10-01 NOTE — Progress Notes (Signed)
Physical Therapy Treatment Patient Details Name: Lori Berger MRN: [id number] DOB: 11/07/1940 Today's Date: 10/01/2019    History of Present Illness Pt admitted for pneumonia secondary to covid infection. HIstory includes HTN, DM, and depression. Pt with multiple falls, all imaging negative at this time.    PT Comments    Patient received in bed, eating ice cream. Reports she is not feeling good. Agrees to bed exercises, but does not want to get up to recliner. Cannot elaborate on why she is feeling bad, just states she does not feel good. Patient will continue to benefit from skilled PT while here to improve strength and functional independence.      Follow Up Recommendations  SNF     Equipment Recommendations  Rolling walker with 5" wheels    Recommendations for Other Services       Precautions / Restrictions Precautions Precautions: Fall Restrictions Weight Bearing Restrictions: No    Mobility  Bed Mobility               General bed mobility comments: patient declines OOB activities today.  Transfers                 General transfer comment: patient declined getting up to recliner today.  Ambulation/Gait                 Stairs             Wheelchair Mobility    Modified Rankin (Stroke Patients Only)       Balance                                            Cognition Arousal/Alertness: Awake/alert Behavior During Therapy: WFL for tasks assessed/performed Overall Cognitive Status: No family/caregiver present to determine baseline cognitive functioning                                 General Comments: appears somewhat confused, poor historian      Exercises Other Exercises Other Exercises: Supine B LE exercises to include: AP, heel slides, hip abd, SLR 2 x 5 reps each    General Comments        Pertinent Vitals/Pain Pain Assessment: No/denies pain    Home Living                       Prior Function            PT Goals (current goals can now be found in the care plan section) Acute Rehab PT Goals Patient Stated Goal: to go back to [facility] PT Goal Formulation: With patient Time For Goal Achievement: 10/10/19 Potential to Achieve Goals: Good Additional Goals Additional Goal #1: Pt will be able to perform bed mobility/transfers with cga and safe technique to improve functional independence Progress towards PT goals: Progressing toward goals    Frequency    Min 2X/week      PT Plan Current plan remains appropriate    Co-evaluation              AM-PAC PT "6 Clicks" Mobility   Outcome Measure    Help needed moving from lying on your back to sitting on the side of a flat bed without using bedrails?: A Little Help needed moving to and from a  bed to a chair (including a wheelchair)?: A Little Help needed standing up from a chair using your arms (e.g., wheelchair or bedside chair)?: A Little Help needed to walk in hospital room?: A Lot Help needed climbing 3-5 steps with a railing? : Total 6 Click Score: 12    End of Session   Activity Tolerance: Patient tolerated treatment well Patient left: in bed;with bed alarm set;with call bell/phone within reach Nurse Communication: Mobility status PT Visit Diagnosis: Muscle weakness (generalized) (M62.81);Other abnormalities of gait and mobility (R26.89);History of falling (Z91.81)     Time: [id number] PT Time Calculation (min) (ACUTE ONLY): 25 min  Charges:  $Therapeutic Exercise: 8-22 mins                     Macarius Ruark, PT, GCS 10/01/19,12:34 PM

## 2019-10-01 NOTE — Progress Notes (Addendum)
PROGRESS NOTE    Lori Berger  [id number] DOB: [birth year] DOA: 09/25/2019  PCP: Jerrol Banana., MD    LOS - 6   Brief Narrative:  78 y.o.femalewith medical history significant ofhypertension, diabetes, arthritis, hyperlipidemia, depression. She presented to the ED from her SNF after an apparent fall, after beingin the EDday before alsofor a fallat which timeall imaging that time was negative. Per report, SNF staff reported to the EMS that patient had a near fall today, did not actually hit the ground.Patient is known recently tested positive for COVID-19. This visit, patienthypoxic requiring 2 L/min of oxygen to maintain her O2 sat over 90%. She denies respiratory or GI symptoms, but reportsfatigued and generally weak recently.In the ED,O2 sat 87% on room air, improved to mid 90s on 2 L/min oxygen. BP92/45. Tachypneic, tachycardic. Chest x-ray showed patchy densities in both lung bases consistent with atelectasis or pneumonia. Admitted to monitored bed as inpatient for further managementof acute hypoxic respiratory failure secondary to Covid-19Pneumonia.  Subjective 12/28: Seen this morning.  Appears to be stable.  Hard of hearing.  Awaiting SNF placement.   Assessment & Plan:   Principal Problem:   Pneumonia due to COVID-19 virus Active Problems:   Diabetes mellitus, type 2 ([place])   Essential (primary) hypertension   Acute lower UTI   Acute respiratory failure with hypoxia (HCC)   Acute respiratory failure with hypoxia secondary to COVID-19 pneumonia Hypoxia resolved, had been requiring 2 L/min with sats upper 90's. --Supplemental oxygen PRN only now, maintain O2 sat>90% -S/P Remdesivir per pharmacy protocol - last day 12/26 - continue Decadron IV until discharge (may treat for a total of 10 days) since she was hypoxic -Vitamin C and zinc -Airborne and contact precautions -Combivent inhaler changed -Antitussives continue -Awaiting placement  to SNF  Acute lower UTI, present on admission: No acute issues  Urine culture of 12/17 reviewed, this grew staph epidermidis sensitive to Bactrim. Had just been started on Bactrim outpatient. --completed course of Bactrim  Type 2 diabetes Home regimen is Invokana-Metformin. --Sliding scale sensitive Novolog  Essential hypertension- But currently running low to low normal range -Cont to hold home amlodipine and lisinopril for now - May give [place] ivF bolus  --Monitor blood pressure closely and resume meds as indicated  Coronary artery disease-stable -Continue aspirin and Plavix  Hyperlipidemia --Continue simvastatin  History of breast and endometrial cancer --Continue home letrozole - F/U OP   DVT prophylaxis:Lovenox Code Status: DNR Family Communication: Spoke with daughter in Sports coach, Mariann Laster. Thinks Pt is depressed and asking for Psych eval. - Explained family that Pt has no typical S/S of depression for which she needs immediate or inpatient psych eval. Also explained, besides age, post CoVid-19 effect could be more likely, which is very common among this age group.  - Daughter in law disagreed.  - Stressed to family/ daughter in Sports coach, that we remain respectful to their view or opinion but not necessarily always in agreement medically.   Pt Disposition Plan:Pending SNF/rehab placement. Case manager working on it.   Consultants:  none  Procedures:  none  Antimicrobials:  none    Objective: Vitals:   09/30/19 1801 09/30/19 2121 10/01/19 0509 10/01/19 1138  BP: (!) 109/58 (!) 91/54 (!) 91/53 (!) 99/59  Pulse: 77 78 78 79  Resp: 17 18 16 16   Temp: 98 F (36.7 C) 97.6 F (36.4 C) 97.6 F (36.4 C) 98.4 F (36.9 C)  TempSrc: Oral Oral Oral Oral  SpO2: 96% 95% 95%  95%  Weight:      Height:        Intake/Output Summary (Last 24 hours) at 10/01/2019 1336 Last data filed at 09/30/2019 1848 Gross per 24 hour  Intake 253.62 ml  Output 300  ml  Net -46.38 ml   Filed Weights   09/25/19 2106  Weight: 74.8 kg    Examination:  General exam: awake, alert, no acute distress HEENT: moist mucus membranes, hearing grossly normal  Respiratory system: clear to auscultation bilaterally, no wheezes, rales or rhonchi, normal respiratory effort. Cardiovascular system: normal S1/S2, RRR, no JVD, murmurs, rubs, gallops, no pedal edema.   Gastrointestinal system: soft, non-tender, non-distended abdomen, normal bowel sounds. Central nervous system: alert and oriented x3. no gross focal neurologic deficits, normal speech Extremities: moves all, no cyanosis, normal tone Skin: dry, intact, normal temperature,  Psychiatry: normal mood, congruent affect. Denies any homicidal or suicidal thoughts or ideas. Denies any hallucination.    Data Reviewed: I have personally reviewed following labs and imaging studies  CBC: Recent Labs  Lab 09/26/19 0606 09/27/19 0429 09/28/19 0611 09/29/19 0550 09/30/19 0718  WBC 4.8 8.7 8.6 9.0 7.4  NEUTROABS 4.0 7.4 7.4 7.1 5.5  HGB 13.8 13.4 12.8 13.1 13.0  HCT 41.0 40.3 38.5 39.4 38.8  MCV 90.1 91.6 91.7 92.1 90.0  PLT 240 303 321 322 [id number]   Basic Metabolic Panel: Recent Labs  Lab 09/27/19 0429 09/28/19 0611 09/29/19 0550 09/30/19 0718 10/01/19 1000  NA 142 140 142 140 136  K 4.5 4.6 5.0 5.0 4.6  CL 110 112* 112* 109 109  CO2 18* 17* 21* 21* 18*  GLUCOSE 221* 201* 213* 161* 230*  BUN 64* 65* 62* 55* 57*  CREATININE 1.11* 0.92 0.93 0.83 0.84  CALCIUM 9.2 9.1 9.1 8.8* 8.6*  MG 2.9* 2.8* 2.8* 2.6* 2.6*   GFR: Estimated Creatinine Clearance: 52.9 mL/min (by C-G formula based on SCr of 0.84 mg/dL). Liver Function Tests: Recent Labs  Lab 09/26/19 0606 09/27/19 0429 09/28/19 0611 09/29/19 0550 09/30/19 0718  AST 17 17 23 17 18   ALT 14 15 19 17 19   ALKPHOS 54 61 56 58 54  BILITOT 0.8 0.6 0.6 0.6 0.6  PROT 6.7 6.6 6.2* 6.5 5.9*  ALBUMIN 3.1* 3.1* 3.0* 3.1* 2.9*   No results for  input(s): LIPASE, AMYLASE in the last 168 hours. No results for input(s): AMMONIA in the last 168 hours. Coagulation Profile: No results for input(s): INR, PROTIME in the last 168 hours. Cardiac Enzymes: No results for input(s): CKTOTAL, CKMB, CKMBINDEX, TROPONINI in the last 168 hours. BNP (last 3 results) No results for input(s): PROBNP in the last 8760 hours. HbA1C: No results for input(s): HGBA1C in the last 72 hours. CBG: Recent Labs  Lab 09/30/19 1142 09/30/19 1641 09/30/19 2056 10/01/19 0758 10/01/19 1135  GLUCAP 229* 204* 198* 167* 179*   Lipid Profile: No results for input(s): CHOL, HDL, LDLCALC, TRIG, CHOLHDL, LDLDIRECT in the last 72 hours. Thyroid Function Tests: No results for input(s): TSH, T4TOTAL, FREET4, T3FREE, THYROIDAB in the last 72 hours. Anemia Panel: Recent Labs    09/30/19 0718 10/01/19 1000  FERRITIN 406* 430*   Sepsis Labs: Recent Labs  Lab 09/25/19 1241 09/25/19 1329 09/25/19 1507  PROCALCITON 0.14  --   --   LATICACIDVEN  --  1.8 1.6    Recent Results (from the past 240 hour(s))  Blood Culture (routine x 2)     Status: None   Collection Time: 09/25/19  1:29 PM  Specimen: BLOOD  Result Value Ref Range Status   Specimen Description BLOOD BLOOD RIGHT WRIST  Final   Special Requests   Final    BOTTLES DRAWN AEROBIC AND ANAEROBIC Blood Culture adequate volume   Culture   Final    NO GROWTH 5 DAYS Performed at [facility], [address], [place], [place] [address]    Report Status 09/30/2019 FINAL  Final  Blood Culture (routine x 2)     Status: None   Collection Time: 09/25/19  1:29 PM   Specimen: BLOOD  Result Value Ref Range Status   Specimen Description BLOOD RIGHT ANTECUBITAL  Final   Special Requests   Final    BOTTLES DRAWN AEROBIC AND ANAEROBIC Blood Culture adequate volume   Culture   Final    NO GROWTH 5 DAYS Performed at [facility], [address] [place], [place] [address]    Report Status  09/30/2019 FINAL  Final  MRSA PCR Screening     Status: None   Collection Time: 09/26/19 12:59 AM   Specimen: Nasopharyngeal  Result Value Ref Range Status   MRSA by PCR NEGATIVE NEGATIVE Final    Comment:        The GeneXpert MRSA Assay (FDA approved for NASAL specimens only), is one component of a comprehensive MRSA colonization surveillance program. It is not intended to diagnose MRSA infection nor to guide or monitor treatment for MRSA infections. Performed at [facility], [address] [place], [place] [address]          Radiology Studies: No results found.      Scheduled Meds: . vitamin C  500 mg Oral Daily  . aspirin EC  81 mg Oral Daily  . clopidogrel  75 mg Oral Daily  . dexamethasone (DECADRON) injection  6 mg Intravenous Q24H  . enoxaparin (LOVENOX) injection  40 mg Subcutaneous Q24H  . feeding supplement (NEPRO CARB STEADY)  237 mL Oral TID BM  . insulin aspart  0-9 Units Subcutaneous TID WC  . insulin aspart  6 Units Subcutaneous TID WC  . Ipratropium-Albuterol  1 puff Inhalation Q6H  . letrozole  2.5 mg Oral Daily  . multivitamin with minerals  1 tablet Oral Daily  . nystatin cream  1 application Topical BID  . simvastatin  10 mg Oral QHS  . zinc sulfate  220 mg Oral Daily   Continuous Infusions: . sodium chloride Stopped (09/28/19 1737)     LOS: 6 days    Time spent: 30 to 35 minutes  Thornell Mule, MD [facility]   If 7PM-7AM, please contact night-coverage [email or web address] Password [email or web address] 10/01/2019, 1:36 PM

## 2019-10-01 NOTE — Progress Notes (Signed)
Nutrition Follow Up Note   DOCUMENTATION CODES:   Not applicable  INTERVENTION:   Nepro Shake po TID, each supplement provides 425 kcal and 19 grams protein  Magic cup TID with meals, each supplement provides 290 kcal and 9 grams of protein  MVI daily   Dysphagia 3 diet  NUTRITION DIAGNOSIS:   Increased nutrient needs related to acute illness(COVID 19) as evidenced by increased estimated needs.  GOAL:   Patient will meet greater than or equal to 90% of their needs  -progressing   MONITOR:   PO intake, Supplement acceptance, Labs, Weight trends, Skin, I & O's  ASSESSMENT:   78 y.o. female with medical history significant of breast cancer s/p mastectomy, BCC, hypertension, diabetes, arthritis, hyperlipidemia, depression admitted with COVID 19 PNA and UTI   Unable to reach pt by phone. Pt documented to be eating 50% of meals and is drinking some Nepro and eating the Magic Cups coming up on her meal trays. No new weight since admit; will request weekly weights. Per chart, pt is awaiting SNF placement.   Medications reviewed and include: vitamin C, aspirin, plavix, dexamethasone, insulin, lovenox, MVI, zinc  Labs reviewed: K 4.6 wnl, BUN 57(H), Mg 2.6(H) cbgs- 229, 204, 198, 167, 179 x 24 hrs AIC 7.1(H)- 12/22  Diet Order:   Diet Order            DIET DYS 3 Room service appropriate? Yes; Fluid consistency: Thin  Diet effective now             EDUCATION NEEDS:   Education needs have been addressed  Skin:  Skin Assessment: Reviewed RN Assessment  Last BM:  unknown  Height:   Ht Readings from Last 1 Encounters:  09/25/19 5\' 4"  (1.626 m)    Weight:   Wt Readings from Last 1 Encounters:  09/25/19 74.8 kg    Ideal Body Weight:  54.5 kg  BMI:  Body mass index is 28.32 kg/m.  Estimated Nutritional Needs:   Kcal:  1700-1900kcal/day  Protein:  85-95g/day  Fluid:  1.6L/day  Koleen Distance MS, RD, LDN Pager #- [phone] Office#-  [phone] After Hours Pager: [phone]

## 2019-10-01 NOTE — Care Management Important Message (Signed)
Important Message  Patient Details  Name: Lori Berger MRN: [id number] Date of Birth: 11-05-1940   Medicare Important Message Given:  Yes  Reviewed verbally with [name] at [phone].  Requested copy be emailed to her at [email or web address].  Confirmed address.  Copy of Medicare IM sent securely to email address provided.     Dannette Barbara 10/01/2019, 11:36 AM

## 2019-10-02 ENCOUNTER — Telehealth: Payer: Self-pay

## 2019-10-02 DIAGNOSIS — U071 COVID-19: Principal | ICD-10-CM

## 2019-10-02 DIAGNOSIS — J1289 Other viral pneumonia: Secondary | ICD-10-CM

## 2019-10-02 LAB — GLUCOSE, CAPILLARY
Glucose-Capillary: 162 mg/dL — ABNORMAL HIGH (ref 70–99)
Glucose-Capillary: 275 mg/dL — ABNORMAL HIGH (ref 70–99)
Glucose-Capillary: 229 mg/dL — ABNORMAL HIGH (ref 70–99)
Glucose-Capillary: 270 mg/dL — ABNORMAL HIGH (ref 70–99)

## 2019-10-02 MED ORDER — IPRATROPIUM-ALBUTEROL 20-100 MCG/ACT IN AERS: 1.0000 | INHALATION_SPRAY | Freq: Four times a day (QID) | RESPIRATORY_TRACT | 3 refills | Status: DC

## 2019-10-02 MED ORDER — ADULT MULTIVITAMIN W/MINERALS CH: 1.0000 | ORAL_TABLET | Freq: Every day | ORAL | 0 refills | Status: DC

## 2019-10-02 MED ORDER — BISACODYL 5 MG PO TBEC: 5.0000 mg | DELAYED_RELEASE_TABLET | Freq: Every day | ORAL | 0 refills | Status: DC | PRN

## 2019-10-02 MED ORDER — TRAZODONE HCL 50 MG PO TABS: 25.0000 mg | ORAL_TABLET | Freq: Every evening | ORAL | 0 refills | Status: DC | PRN

## 2019-10-02 MED ORDER — NEPRO/CARBSTEADY PO LIQD: 237.0000 mL | Freq: Three times a day (TID) | ORAL | 3 refills | Status: DC

## 2019-10-02 MED ORDER — ZINC SULFATE 220 (50 ZN) MG PO CAPS: 220.0000 mg | ORAL_CAPSULE | Freq: Every day | ORAL | 0 refills | Status: DC

## 2019-10-02 MED ORDER — DEXAMETHASONE 6 MG PO TABS: 6.0000 mg | ORAL_TABLET | Freq: Every day | ORAL | 0 refills | Status: DC

## 2019-10-02 MED ORDER — ASCORBIC ACID 500 MG PO TABS: 500.0000 mg | ORAL_TABLET | Freq: Every day | ORAL | 0 refills | Status: DC

## 2019-10-02 MED ORDER — ONDANSETRON HCL 4 MG PO TABS: 4.0000 mg | ORAL_TABLET | Freq: Four times a day (QID) | ORAL | 0 refills | Status: DC | PRN

## 2019-10-02 MED ORDER — GUAIFENESIN-DM 100-10 MG/5ML PO SYRP: 10.0000 mL | ORAL_SOLUTION | ORAL | 0 refills | Status: DC | PRN

## 2019-10-02 MED ORDER — INSULIN ASPART 100 UNIT/ML ~~LOC~~ SOLN
6.0000 [IU] | Freq: Three times a day (TID) | SUBCUTANEOUS | Status: DC
  Administered 2019-10-02 – 2019-10-03 (×2): 6 [IU] via SUBCUTANEOUS
  Filled 2019-10-02 (×2): qty 1

## 2019-10-02 MED ORDER — ACETAMINOPHEN 325 MG PO TABS: 650.0000 mg | ORAL_TABLET | Freq: Four times a day (QID) | ORAL | Status: DC | PRN

## 2019-10-02 MED ORDER — INSULIN ASPART 100 UNIT/ML ~~LOC~~ SOLN: 6.0000 [IU] | Freq: Three times a day (TID) | SUBCUTANEOUS | 11 refills | Status: AC

## 2019-10-02 NOTE — Progress Notes (Signed)
Pt daughter in law called into the room around [place] with her briefly but she only wanted to speak to the MD.

## 2019-10-02 NOTE — Discharge Summary (Addendum)
Physician Discharge Summary  Patient ID: Lori Berger MRN: [id number] DOB/AGE: 1940/10/13 78 y.o.  Admit date: 09/25/2019 Discharge date: 10/02/2019  Admission Diagnoses:  Discharge Diagnoses:  Principal Problem:   Pneumonia due to COVID-19 virus Active Problems:   Diabetes mellitus, type 2 ([place])   Essential (primary) hypertension   Acute lower UTI   Acute respiratory failure with hypoxia [facility])   Discharged Condition: fair   Hospital Course:   Brief Narrative:  78 y.o.femalepresented to the ED from her SNF after an apparent fall, after beingin the EDday before alsofor a fallat which timeall imaging that time was negative. Per report, SNF staff reported to the EMS that patient had a near fall today, did not actually hit the ground.Patient is known recently tested positive for COVID-19. This visit, patienthypoxic requiring 2 L/min of oxygen to maintain her O2 sat over 90%. She denies respiratory or GI symptoms, but reportsfatigued and generally weak recently.In the ED,O2 sat 87% on room air, improved to mid 90s on 2 L/min oxygen. BP92/45. Tachypneic, tachycardic. Chest x-ray showed patchy densities in both lung bases consistent with atelectasis or pneumonia. Admitted to monitored bed as inpatient for further managementof acute hypoxic respiratory failure secondary to Covid-19Pneumonia.  Acute respiratory failure with hypoxia secondary to COVID-19 pneumonia: Hypoxia resolved, - had been requiring 2 L/min with sats upper 90's. But now Supplemental oxygen PRN only and Pt has been doing well on RA  -S/P Remdesivir per pharmacy protocol- last day 12/26 - Decadron IV (from 12/23) -- Rx for 4 more days  -Vitamin C and zinc -Airborne and contact precautions for 14 days since the last +ve test  -Combivent inhaler; Rx given  -Antitussives continue - Per PT/OT, recommendation to SNF. Pt is likely to benefit from rehab.   Acute lower UTI, present on admission: Resolved   Urine culture of 12/17 reviewed, this grew staph epidermidis sensitive to Bactrim. - completed course ofBactrim  Type 2 diabetes Home regimen is Invokana-Metformin to be resumed  --Sliding scale sensitiveNovolog inpatient  - Lips dose of novol TID with meals while taking steroid   Essential hypertension- But currently running low to low normal range -Cont to hold home amlodipine and lisinopril for now - Monitor blood pressure closely and resume meds as indicated per SNF  - Encourage PO hydration and intake for more   Coronary artery disease-stable -Continue aspirin and Plavix  Hyperlipidemia --Continue simvastatin  History of breast and endometrial cancer --Continue home letrozole - F/U OP    Family/ daughter in law insists on inpatient psych consult. Explained there is no indication for IP psych at this time. And Pt doesn't need to stay in hospital for that.  - However, if Pt ends up staying for any other reason, may consult psych  - May f/u OP with PCP as well   Consults:  None   Significant Diagnostic Studies: Various radiologic imaging and lab work   Treatments: As per hospital course and d/c med list   Discharge Exam: Blood pressure 91/64, pulse 72, temperature 97.9 F (36.6 C), resp. rate 19, height 5\' 4"  (1.626 m), weight 68.1 kg, SpO2 97 %.  General exam: awake, alert. Oriented to place and person  HEENT: moist mucus membranes, hearing grossly normal  Respiratory system: clear to auscultation bilaterally, no wheezes, rales or rhonchi, normal respiratory effort. Cardiovascular system: normal S1/S2, RRR, no JVD, murmurs, rubs, gallops, no pedal edema.   Gastrointestinal system: soft, non-tender, non-distended abdomen, normal bowel sounds. Central nervous system: alert and  oriented; no gross focal neurologic deficits, normal speech Extremities: moves all, no cyanosis, normal tone Skin: dry, intact, normal temperature,  Psychiatry: normal mood, mildly  anxious   Disposition: Discharge disposition: 03-Skilled Nursing Facility     Pt is stable to be discharged to SNF for rehab.   Discharge Instructions    Bed rest   Complete by: As directed    Call MD for:  difficulty breathing, headache or visual disturbances   Complete by: As directed    Call MD for:  temperature >100.4   Complete by: As directed    Diet - low sodium heart healthy   Complete by: As directed    High protein diet   Walk with assistance   Complete by: As directed      Allergies as of 10/02/2019      Reactions   Penicillins       Medication List    STOP taking these medications   amLODipine 5 MG tablet Commonly known as: NORVASC   ibuprofen 200 MG tablet Commonly known as: Advil   lisinopril 20 MG tablet Commonly known as: ZESTRIL   sulfamethoxazole-trimethoprim 800-160 MG tablet Commonly known as: BACTRIM DS     TAKE these medications   ascorbic acid 500 MG tablet Commonly known as: VITAMIN C Take 1 tablet (500 mg total) by mouth daily. Start taking on: October 03, 2019   aspirin EC 81 MG tablet Take 1 tablet (81 mg total) by mouth daily.   bisacodyl 5 MG EC tablet Commonly known as: DULCOLAX Take 1 tablet (5 mg total) by mouth daily as needed for moderate constipation.   Calcium-Vitamin D-Vitamin K [id number] MG-UNT-MCG Tabs Take 1 tablet by mouth daily.   Canagliflozin-metFORMIN HCl 150-500 MG Tabs Commonly known as: Invokamet Take 1 tablet by mouth 2 (two) times daily.   clopidogrel 75 MG tablet Commonly known as: PLAVIX TAKE 1 TABLET BY MOUTH  DAILY   dexamethasone 6 MG tablet Commonly known as: DECADRON Take 1 tablet (6 mg total) by mouth daily.   feeding supplement (NEPRO CARB STEADY) Liqd Take 237 mLs by mouth 3 (three) times daily between meals.   glucose blood test strip Check sugar daily   guaiFENesin-dextromethorphan 100-10 MG/5ML syrup Commonly known as: ROBITUSSIN DM Take 10 mLs by mouth every 4 (four) hours  as needed for cough.   insulin aspart 100 UNIT/ML injection Commonly known as: novoLOG Inject 6 Units into the skin 3 (three) times daily with meals for 5 days.   Ipratropium-Albuterol 20-100 MCG/ACT Aers respimat Commonly known as: COMBIVENT Inhale 1 puff into the lungs every 6 (six) hours.   letrozole 2.5 MG tablet Commonly known as: FEMARA TAKE 1 TABLET BY MOUTH  DAILY   multivitamin with minerals Tabs tablet Take 1 tablet by mouth daily. Start taking on: October 03, 2019   nystatin cream Commonly known as: MYCOSTATIN Apply 1 application topically 2 (two) times daily.   ondansetron 4 MG tablet Commonly known as: ZOFRAN Take 1 tablet (4 mg total) by mouth every 6 (six) hours as needed for nausea.   simvastatin 10 MG tablet Commonly known as: ZOCOR TAKE 1 TABLET BY MOUTH AT  BEDTIME   traZODone 50 MG tablet Commonly known as: DESYREL Take 0.5 tablets (25 mg total) by mouth at bedtime as needed for sleep.   zinc sulfate 220 (50 Zn) MG capsule Take 1 capsule (220 mg total) by mouth daily. Start taking on: October 03, 2019       Signed:  Thornell Mule 10/02/2019, 1:26 PM

## 2019-10-02 NOTE — TOC Progression Note (Signed)
Transition of Care [facility]) - Progression Note    Patient Details  Name: DAQUISHA KOVALCIK MRN: [id number] Date of Birth: 08-23-1941  Transition of Care [facility]) CM/SW Contact  [name], RN Phone Number: 10/02/2019, 3:11 PM  Clinical Narrative:    RNCM reached out to Irving Burton daughter in law to discuss discharge planning today. Per MD patient is medically ready for discharge  RNCM spoke with [facility] and they no longer have a bed to offer. RNCM spoke with [facility] and they will have a bed available on Wednesday.  Mariann Laster states the are not interested in accepting offer and [place], and if that is the only option they will file an appeal  Mariann Laster request that I reached out to [facility] - no covid beds [employer] - was unable to get through or leave a message [employer] - Not accepting covid patients [facility] _not accepting covid patients [facility] - not accepting covid patients  [facility] - not accepting covid patients [facility] - voicemail left   Expected Discharge Plan: [place] Barriers to Discharge: Continued Medical Work up  Expected Discharge Plan and Services Expected Discharge Plan: [place]         Expected Discharge Date: 10/02/19                                     Social Determinants of Health (SDOH) Interventions    Readmission Risk Interventions No flowsheet data found.

## 2019-10-02 NOTE — Progress Notes (Signed)
Inpatient Diabetes Program Recommendations  AACE/ADA: New Consensus Statement on Inpatient Glycemic Control (2015)  Target Ranges:  Prepandial:   less than 140 mg/dL      Peak postprandial:   less than 180 mg/dL (1-2 hours)      Critically ill patients:  140 - 180 mg/dL   Results for Newcom, LANDIS BEAMON (MRN [id number]) as of 10/02/2019 12:14  Ref. Range 10/01/2019 07:58 10/01/2019 11:35 10/01/2019 16:47 10/01/2019 20:40  Glucose-Capillary Latest Ref Range: 70 - 99 mg/dL 167 (H)  8 units NOVOLOG  179 (H)  8 units NOVOLOG  279 (H)  11 units NOVOLOG  355 (H)  5 units NOVOLOG    Results for Achey, RINA BRINSER (MRN [id number]) as of 10/02/2019 12:14  Ref. Range 10/02/2019 08:13 10/02/2019 12:02  Glucose-Capillary Latest Ref Range: 70 - 99 mg/dL 162 (H)  8 units NOVOLOG given at 10am 275 (H)    Home DM Meds: Invokamet 150/500 1 tablet BID   Current Insulin orders: Novolog Sensitive Correction Scale/ SSI (0-9 units) TID AC + HS       Novolog 6 units TID with meals     Getting Decadron 6 mg Daily.    MD- Please consider increasing Novolog Meal Coverage to 8 units TID with meals while patient getting Steroids    --Will follow patient during hospitalization--  Wyn Quaker RN, MSN, CDE Diabetes Coordinator Inpatient Glycemic Control Team Team Pager: [phone] (8a-5p)

## 2019-10-02 NOTE — Telephone Encounter (Signed)
Please advise 

## 2019-10-02 NOTE — TOC Progression Note (Addendum)
Transition of Care [facility]) - Progression Note    Patient Details  Name: Lori Berger MRN: [id number] Date of Birth: January 02, 1941  Transition of Care [facility]) CM/SW Contact  Lori Sessions, RN Phone Number: 10/02/2019, 3:17 PM  Clinical Narrative:    RNCM spoke with [facility] and they are able to accept patient today RNCM received return call from [facility].  They are approving patient to go to SNF level of care, however are not able to provide me with and auth number until patient selects a facility   MD has written discharge order for patient   RNCM called Lori Berger to update and present bed offer.  At this time she declines to accept bed offer at [facility] or [facility].  Medicare appeal has been filed.   Case was discussed with department director and confirmed that we need to move forward with discharge plan as there is a SNF bed available and insurance has approved.  MD notified that family has filed and appeal  Appeal reference number [id number]  Conference call with Lead TOC Lori Berger, myself, Lori Berger RNCM (witness for signature) and daughter in law Lori Berger.  [id number] and detailed discharge notified were review.  Lori Berger gave verbal permission for forms to be signed. Signed by myself and witness.  Lori Berger requests for forms to be emailed,  Permission received from lead TOC.    Lori Berger request for patient to be reswabbed for coivd.  MD notified and states there is not an indication for patient to be swabbed.  Per infection prevention not indicated for patients to be swabbed 3 months after testing positive  Lori Berger requests to speak to MD.  MD notified.  Family request psych consul.  MD notified  Lori Berger states that she has not received a return call from bed side RN. Department leadership Lori Berger Notified. Lori Berger request to speak with [facility] director.  Lori Berger notified    [address] in agreement.  Bed search extended to local facilities with it noted that patient tested positive for covid on  09/19/19 and tomorrow would be day 14.  She is aware that this may not expand our options, but would like to see if anyone can accept after the 14 day period. TOC director notified     Expected Discharge Plan: Skilled Nursing Facility Barriers to Discharge: Continued Medical Work up  Expected Discharge Plan and Services Expected Discharge Plan: [place]         Expected Discharge Date: 10/02/19                                     Social Determinants of Health (SDOH) Interventions    Readmission Risk Interventions No flowsheet data found.

## 2019-10-02 NOTE — Plan of Care (Signed)
  Problem: Education: Goal: Knowledge of General Education information will improve Description Including pain rating scale, medication(s)/side effects and non-pharmacologic comfort measures Outcome: Progressing   

## 2019-10-02 NOTE — Telephone Encounter (Signed)
Spent 15 minutes speaking with the daughter-in-law.  Patient has had a Covid admission and is now trying to have a rehab bed.  No one in [facility] will take her because of the Covid diagnosis.  They are just having a tough time.

## 2019-10-02 NOTE — Telephone Encounter (Signed)
Copied from [place] [id number]. Topic: General - Inquiry >> Oct 02, 2019 11:17 AM Richardo Priest, NT wrote: Reason for CRM: Pt's daughter in law called in stating she would like to speak with PCP about care pt is receiving in [facility] with COVID-19. Ms.Wanda was very adamant that she did not want to speak with a nurse and wanted Dr.Gilbert to call. Please advise.

## 2019-10-02 NOTE — Care Management (Signed)
Full detailed CM note to follow.

## 2019-10-02 NOTE — Progress Notes (Signed)
Physical Therapy Treatment Patient Details Name: Lori Berger MRN: [id number] DOB: 1941/05/08 Today's Date: 10/02/2019    History of Present Illness Pt admitted for pneumonia secondary to covid infection. HIstory includes HTN, DM, and depression. Pt with multiple falls, all imaging negative at this time.    PT Comments    Patient received in bed. Reports she is hurting, burning up, "i'm sick". She agrees to PT session. Performed LE strengthening exercises in bed with min guard assist.  She demonstrates decreased initiation of mobility, required mod assist to scoot in bed toward edge. Once seated at edge she reports she cannot stand, her legs wont hold her. Asking for me to just pick her up. She does not want to try even with assist and elevated surface. Required mod assist to return to bed in supine position. She will continue to benefit from skilled PT while here to improve strength and functional independence.       Follow Up Recommendations  SNF;Supervision/Assistance - 24 hour     Equipment Recommendations  Rolling walker with 5" wheels    Recommendations for Other Services       Precautions / Restrictions Precautions Precautions: Fall Restrictions Weight Bearing Restrictions: No    Mobility  Bed Mobility Overal bed mobility: Needs Assistance Bed Mobility: Supine to Sit;Sit to Supine     Supine to sit: Mod assist Sit to supine: Mod assist   General bed mobility comments: mod assist needed to scoot hips while in bed. Patient demonstrates decreased initiation for mobility.  Transfers                 General transfer comment: Patient states she can't. Unwilling to attempt even with bed raised and assistance. States "can't you just pick me up?"  Ambulation/Gait                 Stairs             Wheelchair Mobility    Modified Rankin (Stroke Patients Only)       Balance Overall balance assessment: Needs assistance Sitting-balance  support: Bilateral upper extremity supported;Feet supported Sitting balance-Leahy Scale: Good                                      Cognition Arousal/Alertness: Awake/alert Behavior During Therapy: WFL for tasks assessed/performed;Anxious Overall Cognitive Status: No family/caregiver present to determine baseline cognitive functioning                                        Exercises Other Exercises Other Exercises: Supine B LE exercises: AP, hip abd/add x 10 reps, SLR x 5 reps each    General Comments        Pertinent Vitals/Pain Pain Assessment: Faces Faces Pain Scale: Hurts little more Pain Location: bottom Pain Descriptors / Indicators: Sore;Discomfort Pain Intervention(s): Limited activity within patient's tolerance;Monitored during session;Repositioned;Other (comment)(asked RN to give patient tylenol)    Home Living                      Prior Function            PT Goals (current goals can now be found in the care plan section) Acute Rehab PT Goals Patient Stated Goal: to go back to [facility] PT Goal Formulation: With  patient Time For Goal Achievement: 10/10/19 Potential to Achieve Goals: Fair Additional Goals Additional Goal #1: Pt will be able to perform bed mobility/transfers with cga and safe technique to improve functional independence Progress towards PT goals: Progressing toward goals    Frequency    Min 2X/week      PT Plan Current plan remains appropriate    Co-evaluation              AM-PAC PT "6 Clicks" Mobility   Outcome Measure  Help needed turning from your back to your side while in a flat bed without using bedrails?: A Little Help needed moving from lying on your back to sitting on the side of a flat bed without using bedrails?: A Lot Help needed moving to and from a bed to a chair (including a wheelchair)?: A Lot Help needed standing up from a chair using your arms (e.g., wheelchair or  bedside chair)?: A Lot Help needed to walk in hospital room?: A Lot Help needed climbing 3-5 steps with a railing? : Total 6 Click Score: 12    End of Session   Activity Tolerance: Patient limited by pain Patient left: in bed;with bed alarm set;with call bell/phone within reach Nurse Communication: Mobility status;Other (comment)(MD asked that I ask RN to give her tylenol) PT Visit Diagnosis: History of falling (Z91.81);Other abnormalities of gait and mobility (R26.89);Muscle weakness (generalized) (M62.81);Pain Pain - part of body: (Bottom, "all over")     Time: 1130-1150 PT Time Calculation (min) (ACUTE ONLY): 20 min  Charges:  $Therapeutic Activity: 8-22 mins                     Tracey Stewart, PT, GCS 10/02/19,12:12 PM

## 2019-10-03 DIAGNOSIS — R410 Disorientation, unspecified: Secondary | ICD-10-CM | POA: Diagnosis not present

## 2019-10-03 DIAGNOSIS — R799 Abnormal finding of blood chemistry, unspecified: Secondary | ICD-10-CM | POA: Diagnosis not present

## 2019-10-03 DIAGNOSIS — J1289 Other viral pneumonia: Secondary | ICD-10-CM | POA: Diagnosis not present

## 2019-10-03 DIAGNOSIS — M1990 Unspecified osteoarthritis, unspecified site: Secondary | ICD-10-CM | POA: Diagnosis not present

## 2019-10-03 DIAGNOSIS — D649 Anemia, unspecified: Secondary | ICD-10-CM | POA: Diagnosis not present

## 2019-10-03 DIAGNOSIS — R262 Difficulty in walking, not elsewhere classified: Secondary | ICD-10-CM | POA: Diagnosis not present

## 2019-10-03 DIAGNOSIS — E46 Unspecified protein-calorie malnutrition: Secondary | ICD-10-CM | POA: Diagnosis not present

## 2019-10-03 DIAGNOSIS — J9601 Acute respiratory failure with hypoxia: Secondary | ICD-10-CM | POA: Diagnosis not present

## 2019-10-03 DIAGNOSIS — I10 Essential (primary) hypertension: Secondary | ICD-10-CM | POA: Diagnosis not present

## 2019-10-03 DIAGNOSIS — Z79899 Other long term (current) drug therapy: Secondary | ICD-10-CM | POA: Diagnosis not present

## 2019-10-03 DIAGNOSIS — C50911 Malignant neoplasm of unspecified site of right female breast: Secondary | ICD-10-CM | POA: Diagnosis not present

## 2019-10-03 DIAGNOSIS — R531 Weakness: Secondary | ICD-10-CM | POA: Diagnosis not present

## 2019-10-03 DIAGNOSIS — I639 Cerebral infarction, unspecified: Secondary | ICD-10-CM | POA: Diagnosis not present

## 2019-10-03 DIAGNOSIS — E119 Type 2 diabetes mellitus without complications: Secondary | ICD-10-CM | POA: Diagnosis not present

## 2019-10-03 DIAGNOSIS — N390 Urinary tract infection, site not specified: Secondary | ICD-10-CM | POA: Diagnosis not present

## 2019-10-03 DIAGNOSIS — M6281 Muscle weakness (generalized): Secondary | ICD-10-CM | POA: Diagnosis not present

## 2019-10-03 DIAGNOSIS — R1311 Dysphagia, oral phase: Secondary | ICD-10-CM | POA: Diagnosis not present

## 2019-10-03 DIAGNOSIS — R279 Unspecified lack of coordination: Secondary | ICD-10-CM | POA: Diagnosis not present

## 2019-10-03 DIAGNOSIS — U071 COVID-19: Secondary | ICD-10-CM | POA: Diagnosis not present

## 2019-10-03 DIAGNOSIS — L249 Irritant contact dermatitis, unspecified cause: Secondary | ICD-10-CM | POA: Diagnosis not present

## 2019-10-03 DIAGNOSIS — Z853 Personal history of malignant neoplasm of breast: Secondary | ICD-10-CM | POA: Diagnosis not present

## 2019-10-03 LAB — GLUCOSE, CAPILLARY
Glucose-Capillary: 139 mg/dL — ABNORMAL HIGH (ref 70–99)
Glucose-Capillary: 147 mg/dL — ABNORMAL HIGH (ref 70–99)
Glucose-Capillary: 226 mg/dL — ABNORMAL HIGH (ref 70–99)

## 2019-10-03 MED ORDER — MIRTAZAPINE 15 MG PO TABS: 15.0000 mg | ORAL_TABLET | Freq: Every day | ORAL | 2 refills | Status: AC

## 2019-10-03 MED ORDER — DEXAMETHASONE 6 MG PO TABS: 6.0000 mg | ORAL_TABLET | Freq: Every day | ORAL | 0 refills | Status: AC

## 2019-10-03 NOTE — TOC Progression Note (Addendum)
Transition of Care [facility]) - Progression Note    Patient Details  Name: Lori Berger MRN: [id number] Date of Birth: [birth year]  Transition of Care [facility]) CM/SW Contact  [name], RN Phone Number: 10/03/2019, 9:33 AM  Clinical Narrative:     0909 - message sent to [facility] to confirm bed availability - awaiting response  0910- Message sent to [facility] to confirm there is still no availability - awaiting response  0932 - Voicemail left for [facility] to determine if they still have a bed available today.  Awaiting return call  1000 - received return call from [facility].  They no longer have a covid bed available at this time  Expected Discharge Plan: [place] Barriers to Discharge: Continued Medical Work up  Expected Discharge Plan and Services Expected Discharge Plan: [place]         Expected Discharge Date: 10/02/19                                     Social Determinants of Health (SDOH) Interventions    Readmission Risk Interventions No flowsheet data found.

## 2019-10-03 NOTE — Progress Notes (Signed)
Notified Mariann Laster, daughter in law, that EMS is here to pick up patient.

## 2019-10-03 NOTE — Consult Note (Addendum)
[facility] Face-to-Face Psychiatry Consult   Reason for Consult:  Depression Referring Physician:  Dr. Loleta Books Patient Identification: Lori Berger MRN:  [id number] Principal Diagnosis: Pneumonia due to COVID-19 virus Diagnosis:   MDD, Recurrent, Moderate with anxious distress  Total Time spent with patient: 45 minutes   HPI:  Psychiatrist has been consult for this patient due to depression. Patient tells me that she has been feeling "awful" for a long time. She is unable to provide a timeframe/duration or severity at this time. Says that being around her granddaughter makes her feel happy but no other alleviating factors. Says that being sick is making her depression feel worse. She worries about recovering from Covid as she has heard many things in the news and elsewhere about this virus. Prior to becoming ill, she reports sustained depression. She denies suicide ideations today and recently. No hx of suicide attempts. No anger issues.  Reports poor sleep and appetite. Says that she has lost weight but is uncertain of how much. Denies hallucinations, flight of ideas, symptoms of mania, delusions, irritability, anger. She contracts for safety. No homicidal ideation's.     Past Psychiatric History:  No admissions. No hx of psych medications, per pt.    Past Medical History:  Past Medical History:  Diagnosis Date  . Arthritis   . BCC (basal cell carcinoma)   . Breast cancer ([place]) 1990   left 1990  . Breast cancer ([place]) 11/11/2015   Right, T2 (2.4 cm(, N0; positive deep margin, ER: 100%, PR: 60%; Her 2 neu not amplified, Mammoprint: low risk  . Diabetes mellitus without complication ([place])   . Endometrial cancer ([place])   . Hyperlipidemia   . Hypertension   . Major depressive disorder   . PONV (postoperative nausea and vomiting)     Past Surgical History:  Procedure Laterality Date  . ABDOMINAL HYSTERECTOMY    . MASTECTOMY Left    1990  . SENTINEL NODE BIOPSY Right 11/11/2015   Procedure: SENTINEL NODE BIOPSY;  Surgeon: Robert Bellow, MD;  Location: [facility] ORS;  Service: General;  Laterality: Right;  . SIMPLE MASTECTOMY WITH AXILLARY SENTINEL NODE BIOPSY Right 11/11/2015   Procedure: SIMPLE MASTECTOMY;  Surgeon: Robert Bellow, MD;  Location: [facility] ORS;  Service: General;  Laterality: Right;   Family History:  Family History  Problem Relation Age of Onset  . Heart disease Mother        Fatal MI age 57  . Stroke Father        fatal age 30  . Cancer Sister        Colon  . Cancer Brother        Died from Kidney cancer  . Breast cancer Neg Hx    Social History:  Social History   Substance and Sexual Activity  Alcohol Use No     Social History   Substance and Sexual Activity  Drug Use No    Social History   Socioeconomic History  . Marital status: Widowed    Spouse name: widowed  . Number of children: 3  . Years of education: Not on file  . Highest education level: Some college, no degree  Occupational History  . Occupation: retired  Tobacco Use  . Smoking status: Never Smoker  . Smokeless tobacco: Never Used  Substance and Sexual Activity  . Alcohol use: No  . Drug use: No  . Sexual activity: Never  Other Topics Concern  . Not on file  Social History Narrative  .  Not on file   Social Determinants of Health   Financial Resource Strain:   . Difficulty of Paying Living Expenses: Not on file  Food Insecurity:   . Worried About Charity fundraiser in the Last Year: Not on file  . Ran Out of Food in the Last Year: Not on file  Transportation Needs:   . Lack of Transportation (Medical): Not on file  . Lack of Transportation (Non-Medical): Not on file  Physical Activity:   . Days of Exercise per Week: Not on file  . Minutes of Exercise per Session: Not on file  Stress:   . Feeling of Stress : Not on file  Social Connections:   . Frequency of Communication with Friends and Family: Not on file  . Frequency of Social Gatherings with  Friends and Family: Not on file  . Attends Religious Services: Not on file  . Active Member of Clubs or Organizations: Not on file  . Attends Archivist Meetings: Not on file  . Marital Status: Not on file   Additional Social History:    Allergies:   Allergies  Allergen Reactions  . Penicillins     Labs:  Results for orders placed or performed during the hospital encounter of 09/25/19 (from the past 48 hour(s))  Glucose, capillary     Status: Abnormal   Collection Time: 10/01/19 11:35 AM  Result Value Ref Range   Glucose-Capillary 179 (H) 70 - 99 mg/dL   Comment 1 Notify RN   Glucose, capillary     Status: Abnormal   Collection Time: 10/01/19  4:47 PM  Result Value Ref Range   Glucose-Capillary 279 (H) 70 - 99 mg/dL   Comment 1 Notify RN   Glucose, capillary     Status: Abnormal   Collection Time: 10/01/19  8:40 PM  Result Value Ref Range   Glucose-Capillary 355 (H) 70 - 99 mg/dL   Comment 1 Notify RN   Glucose, capillary     Status: Abnormal   Collection Time: 10/02/19  8:13 AM  Result Value Ref Range   Glucose-Capillary 162 (H) 70 - 99 mg/dL  Glucose, capillary     Status: Abnormal   Collection Time: 10/02/19 12:02 PM  Result Value Ref Range   Glucose-Capillary 275 (H) 70 - 99 mg/dL  Glucose, capillary     Status: Abnormal   Collection Time: 10/02/19  4:49 PM  Result Value Ref Range   Glucose-Capillary 229 (H) 70 - 99 mg/dL  Glucose, capillary     Status: Abnormal   Collection Time: 10/02/19  8:37 PM  Result Value Ref Range   Glucose-Capillary 270 (H) 70 - 99 mg/dL  Glucose, capillary     Status: Abnormal   Collection Time: 10/03/19  8:13 AM  Result Value Ref Range   Glucose-Capillary 139 (H) 70 - 99 mg/dL    Current Facility-Administered Medications  Medication Dose Route Frequency Provider Last Rate Last Admin  . 0.9 %  sodium chloride infusion   Intravenous PRN Nicole Kindred A, DO   Stopped at 09/28/19 1737  . acetaminophen (TYLENOL) tablet  650 mg  650 mg Oral Q6H PRN Thornell Mule, MD      . ascorbic acid (VITAMIN C) tablet 500 mg  500 mg Oral Daily Nicole Kindred A, DO   500 mg at 10/03/19 0913  . aspirin EC tablet 81 mg  81 mg Oral Daily Nicole Kindred A, DO   81 mg at 10/03/19 [id number]  . bisacodyl (DULCOLAX)  EC tablet 5 mg  5 mg Oral Daily PRN Nicole Kindred A, DO      . chlorpheniramine-HYDROcodone (TUSSIONEX) 10-8 MG/5ML suspension 5 mL  5 mL Oral Q12H PRN Nicole Kindred A, DO      . clopidogrel (PLAVIX) tablet 75 mg  75 mg Oral Daily Nicole Kindred A, DO   75 mg at 10/03/19 0913  . dexamethasone (DECADRON) injection 6 mg  6 mg Intravenous Q24H Nicole Kindred A, DO   6 mg at 10/03/19 0913  . enoxaparin (LOVENOX) injection 40 mg  40 mg Subcutaneous Q24H Nicole Kindred A, DO   40 mg at 10/02/19 2110  . feeding supplement (NEPRO CARB STEADY) liquid 237 mL  237 mL Oral TID BM Nicole Kindred A, DO 0 mL/hr at 10/03/19 0700 237 mL at 10/03/19 0911  . guaiFENesin-dextromethorphan (ROBITUSSIN DM) 100-10 MG/5ML syrup 10 mL  10 mL Oral Q4H PRN Nicole Kindred A, DO      . insulin aspart (novoLOG) injection 0-5 Units  0-5 Units Subcutaneous QHS Lang Snow, NP   3 Units at 10/02/19 2111  . insulin aspart (novoLOG) injection 0-9 Units  0-9 Units Subcutaneous TID WC Nicole Kindred A, DO   1 Units at 10/03/19 [id number]  . insulin aspart (novoLOG) injection 6 Units  6 Units Subcutaneous TID WC Thornell Mule, MD   6 Units at 10/03/19 0911  . Ipratropium-Albuterol (COMBIVENT) respimat 1 puff  1 puff Inhalation Q6H Griffith, Kelly A, DO   1 puff at 10/03/19 0912  . letrozole [facility]) tablet 2.5 mg  2.5 mg Oral Daily Nicole Kindred A, DO   2.5 mg at 10/03/19 0912  . magnesium citrate solution 1 Bottle  1 Bottle Oral Once PRN Nicole Kindred A, DO      . multivitamin with minerals tablet 1 tablet  1 tablet Oral Daily Nicole Kindred A, DO   1 tablet at 10/03/19 0912  . nystatin cream (MYCOSTATIN) 1 application  1 application Topical  BID Ezekiel Slocumb, DO   1 application at [id number]  . ondansetron (ZOFRAN) tablet 4 mg  4 mg Oral Q6H PRN Nicole Kindred A, DO       Or  . ondansetron (ZOFRAN) injection 4 mg  4 mg Intravenous Q6H PRN Nicole Kindred A, DO      . polyethylene glycol (MIRALAX / GLYCOLAX) packet 17 g  17 g Oral Daily PRN Nicole Kindred A, DO      . simvastatin (ZOCOR) tablet 10 mg  10 mg Oral QHS Nicole Kindred A, DO   10 mg at 10/02/19 2111  . traZODone (DESYREL) tablet 25 mg  25 mg Oral QHS PRN Nicole Kindred A, DO   25 mg at 09/29/19 2045  . zinc sulfate capsule 220 mg  220 mg Oral Daily Nicole Kindred A, DO   220 mg at 10/03/19 [id number]    COWS:    Psychiatric Specialty Exam: Physical Exam  Review of Systems  Constitutional:Negative.  Respiratory:Negative.  Cardiovascular:Negative.  Gastrointestinal:Negative.  Psychiatric/Behavioral: Negative foragitation,behavioral problems,confusion,decreased concentration,dysphoric mood,hallucinations,self-injury,sleep disturbanceand suicidal ideas. The patientis not nervous/anxiousand is not hyperactive.     General Appearance:Casual  Eye Contact:Good  Speech:Normal Rate  Volume: soft  Mood:"awful."  Affect:downcast  Thought Process:Coherent and Linear  Orientation:Full (Time, Place, and Person)  Thought Content:Logical  Suicidal Thoughts:No  Homicidal Thoughts:No  Memory:Immediate;Good Recent;Good Remote;Good  Judgement:Fair  Insight:Good  Psychomotor Activity:Normal  Concentration:Concentration:[place] Attention Span: [place]  Language:wnl  Akathisia:No  Handed:Right  AIMS (if indicated):   Assets:Communication Skills Desire for Improvement Housing Physical Health Resilience Social Support  ADL's:Intact  Cognition:WNL  Sleep:      Treatment Plan Summary: Recommend starting Remeron 15 mg POQHS. With benefits  side effects and alternate review. Patient voiced agreement and understanding. Pt does not meet criteria for inpt psychiatric admission.    Rulon Sera, MD 10/03/2019 11:22 AM

## 2019-10-03 NOTE — Progress Notes (Signed)
Report called to Evonnie Dawes, RN, at [facility].  EMS contacted for transport, patient #3 in line.  Mariann Laster, daughter-in-law, notified.

## 2019-10-03 NOTE — TOC Transition Note (Signed)
Transition of Care [facility]) - CM/SW Discharge Note   Patient Details  Name: Lori Berger MRN: [id number] Date of Birth: November 19, 1940  Transition of Care [facility]) CM/SW Contact:  [name], RN Phone Number: 10/03/2019, 3:04 PM   Clinical Narrative:    Psych consult completed.   Conference call with myself, Mudlogger of nursing, English as a second language teacher of med/surg, and Mariann Laster (daughter in Sports coach).  Now that psych consult has been completed they have chosen to withdraw the appeal.  Bed was accepted at [facility]. Auth # [id number].  Tracie at [facility] notified . Discharge information sent in [place].   Notified by Butch Penny at [place] that appeal has been withdrawn.   EMS packet and signed DNR on chart.  Beside RN notified.  No controled substance prescriptions     Final next level of care: Skilled Nursing Facility Barriers to Discharge: No Barriers Identified   Patient Goals and CMS Choice        Discharge Placement              Patient chooses bed at: [facility] [place] Patient to be transferred to facility by: EMS Name of family member notified: Mariann Laster Patient and family notified of of transfer: 10/03/19  Discharge Plan and Services                                     Social Determinants of Health (SDOH) Interventions     Readmission Risk Interventions No flowsheet data found.

## 2019-10-03 NOTE — Discharge Summary (Signed)
Physician Discharge Summary  Lori Berger [id number] DOB: Aug 15, 1941 DOA: 09/25/2019  PCP: Lori Berger., MD  Admit date: 09/25/2019 Discharge date: 10/03/2019  Admitted From: Home  Disposition:  SNF   Recommendations for Outpatient Follow-up:  1. Follow up with PCP in 1-2 weeks after discharge from rehab 2. Continue dexamethasone three more days then stop 3. Start mirtazapine, refer to behavioral health       Home Health: N/A  Equipment/Devices: TBD at SNF  Discharge Condition: Fair  CODE STATUS: DO NOT RESUSCITATE Diet recommendation: Cardiac, diabetic  Brief/Interim Summary: Lori Berger is an 78 y.o.F with DM, HTN who presented with fall, weakness from [facility] and recent COVID diagnosis.  In the ER, O2 sat 87% on room air, improved to mid 90s on 2 L/min oxygen. BP92/45. Tachypneic. Chest x-ray showed patchy densities in both lung bases consistent with atelectasis or pneumonia. Admitted to monitored bed as inpatient for further managementof acute hypoxic respiratory failure secondary to Covid-19Pneumonia.         PRINCIPAL HOSPITAL DIAGNOSIS: COVID-19    Discharge Diagnoses:   Acute respiratory failure with hypoxia secondary to COVID-19 pneumonia Patient admitted and started on remdesivir and steroids.  Completed 5 days remdesivir, hypoxia resolved, respirations at baseline.  Patient now mentating at baseline, taking orals.  Temp < 100 F, heart rate < 100bpm, RR < 24, SpO2 at baseline.  Continued on dexamethasone at discharge.     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  This is typical for COVID in her age group, and has not been shown to respond to medical therapy, and indeed has not responded well to remdesivir and steroids (in the way that her pneumonitis has).  Recommend attention to nutrition, physical rehabilitation.  Her long term prognosis however is  guarded.  Acute lower UTI, present on admission: Resolved  Urine culture of 12/17 reviewed, this grew staph epidermidis sensitive to Bactrim.  Completed course ofBactrim  Type 2 diabetes Home regimen is Invokana-Metformin to be resumed   Essential hypertension Currently running low to low normal range - Monitor blood pressure closely and resume meds as indicated per SNF   Coronary artery disease secondary prevention  History of breast and endometrial cancer  Depression Psychiatry were consulted, started mirtazapine.        Discharge Instructions  Discharge Instructions    Bed rest   Complete by: As directed    Call MD for:  difficulty breathing, headache or visual disturbances   Complete by: As directed    Call MD for:  temperature >100.4   Complete by: As directed    Diet - low sodium heart healthy   Complete by: As directed    High protein diet   Discharge instructions   Complete by: As directed    Give dexamethasone for 3 days then stop  Give nutritional supplement Nepro Carb steady as prescribed   Walk with assistance   Complete by: As directed      Allergies as of 10/03/2019      Reactions   Penicillins       Medication List    STOP taking these medications   amLODipine 5 MG tablet Commonly known as: NORVASC   ibuprofen 200 MG tablet Commonly known as: Advil   lisinopril 20 MG tablet Commonly known as: ZESTRIL   sulfamethoxazole-trimethoprim 800-160 MG tablet Commonly known as: BACTRIM DS     TAKE these medications   ascorbic acid  500 MG tablet Commonly known as: VITAMIN C Take 1 tablet (500 mg total) by mouth daily.   aspirin EC 81 MG tablet Take 1 tablet (81 mg total) by mouth daily.   bisacodyl 5 MG EC tablet Commonly known as: DULCOLAX Take 1 tablet (5 mg total) by mouth daily as needed for moderate constipation.   Calcium-Vitamin D-Vitamin K [id number] MG-UNT-MCG Tabs Take 1 tablet by mouth daily.    Canagliflozin-metFORMIN HCl 150-500 MG Tabs Commonly known as: Invokamet Take 1 tablet by mouth 2 (two) times daily.   clopidogrel 75 MG tablet Commonly known as: PLAVIX TAKE 1 TABLET BY MOUTH  DAILY   dexamethasone 6 MG tablet Commonly known as: DECADRON Take 1 tablet (6 mg total) by mouth daily for 3 days.   feeding supplement (NEPRO CARB STEADY) Liqd Take 237 mLs by mouth 3 (three) times daily between meals.   glucose blood test strip Check sugar daily   guaiFENesin-dextromethorphan 100-10 MG/5ML syrup Commonly known as: ROBITUSSIN DM Take 10 mLs by mouth every 4 (four) hours as needed for cough.   insulin aspart 100 UNIT/ML injection Commonly known as: novoLOG Inject 6 Units into the skin 3 (three) times daily with meals for 5 days.   Ipratropium-Albuterol 20-100 MCG/ACT Aers respimat Commonly known as: COMBIVENT Inhale 1 puff into the lungs every 6 (six) hours.   letrozole 2.5 MG tablet Commonly known as: FEMARA TAKE 1 TABLET BY MOUTH  DAILY   mirtazapine 15 MG tablet Commonly known as: Remeron Take 1 tablet (15 mg total) by mouth at bedtime.   multivitamin with minerals Tabs tablet Take 1 tablet by mouth daily.   nystatin cream Commonly known as: MYCOSTATIN Apply 1 application topically 2 (two) times daily.   ondansetron 4 MG tablet Commonly known as: ZOFRAN Take 1 tablet (4 mg total) by mouth every 6 (six) hours as needed for nausea.   simvastatin 10 MG tablet Commonly known as: ZOCOR TAKE 1 TABLET BY MOUTH AT  BEDTIME   traZODone 50 MG tablet Commonly known as: DESYREL Take 0.5 tablets (25 mg total) by mouth at bedtime as needed for sleep.   zinc sulfate 220 (50 Zn) MG capsule Take 1 capsule (220 mg total) by mouth daily.      Contact information for after-discharge care    Destination    [facility] .   Service: Skilled Nursing Contact information: [address] [place]  [place] [phone]             Allergies  Allergen Reactions  . Penicillins     Consultations:  Psychiatry   Procedures/Studies: DG Pelvis 1-2 Views  Result Date: 09/24/2019 CLINICAL DATA:  Diffuse pain after a fall today.  Initial encounter. EXAM: PELVIS - 1-2 VIEW COMPARISON:  None. FINDINGS: There is lucency in the right intertrochanteric femur which could be secondary to fracture. No other evidence of acute abnormality is identified. No focal bony lesion. IMPRESSION: Findings worrisome for right intertrochanteric fracture. Recommend dedicated plain films of the right hip. Electronically Signed   By: Inge Rise M.D.   On: 09/24/2019 09:44   DG Chest Port 1 View  Result Date: 09/25/2019 CLINICAL DATA:  Fall, COVID positive EXAM: PORTABLE CHEST 1 VIEW COMPARISON:  09/24/2019 FINDINGS: The heart size and mediastinal contours are within normal limits. Patchy density at the lung bases, increased on the right since yesterday. No pleural effusion or pneumothorax. The visualized skeletal structures are unremarkable. IMPRESSION: Patchy density at the lung  bases, which may reflect atelectasis or pneumonia. Electronically Signed   By: Macy Mis M.D.   On: 09/25/2019 14:13   DG Chest Portable 1 View  Result Date: 09/24/2019 CLINICAL DATA:  COVID-19 EXAM: PORTABLE CHEST 1 VIEW COMPARISON:  10/24/2013 FINDINGS: Early infiltrate at the left base. No Kerley lines, effusion, or pneumothorax. Normal heart size and mediastinal contours. IMPRESSION: Mild left base pneumonia. Electronically Signed   By: Monte Fantasia M.D.   On: 09/24/2019 09:43   DG Hip Unilat With Pelvis 2-3 Views Right  Result Date: 09/24/2019 CLINICAL DATA:  78 year old female status post fall this morning. Right hip pain. EXAM: DG HIP (WITH OR WITHOUT PELVIS) 2-3V RIGHT COMPARISON:  CT Abdomen and Pelvis 12/18/2009. FINDINGS: Femoral heads are normally located. Hip joint spaces appear normal for age. Bone  mineralization is within normal limits for age. Grossly intact proximal left femur. Intact pelvis. The proximal right femur appears intact. No acute fracture identified. Negative visible lower abdominal and pelvic visceral contours. IMPRESSION: No acute fracture or dislocation identified about the right hip or pelvis. Electronically Signed   By: Genevie Ann M.D.   On: 09/24/2019 10:55       Subjective: Still tired, "feels bad".  No chest pain, dyspnea, sputum.  No vomiting.  No confusion.  No fever.    Discharge Exam: Vitals:   10/03/19 0452 10/03/19 1139  BP: 110/72 105/67  Pulse: (!) 57 75  Resp: 18   Temp: 97.8 F (36.6 C) (!) 97.5 F (36.4 C)  SpO2: 100% 99%   Vitals:   10/02/19 1202 10/02/19 2043 10/03/19 0452 10/03/19 1139  BP: 91/64 117/63 110/72 105/67  Pulse: 72 91 (!) 57 75  Resp:  18 18   Temp: 97.9 F (36.6 C) 98 F (36.7 C) 97.8 F (36.6 C) (!) 97.5 F (36.4 C)  TempSrc:  Oral Oral Oral  SpO2: 97% 95% 100% 99%  Weight:      Height:        General: Pt is alert, awake, not in acute distress, lying in bed, elderly female Cardiovascular: RRR, nl S1-S2, no murmurs appreciated.   No LE edema.   Respiratory: Normal respiratory rate and rhythm.  CTAB without rales or wheezes. Abdominal: Abdomen soft and non-tender.  No distension or HSM.   Neuro/Psych: Strength symmetric in upper and lower extremities.  Judgment and insight appear normal.   The results of significant diagnostics from this hospitalization (including imaging, microbiology, ancillary and laboratory) are listed below for reference.     Microbiology: Recent Results (from the past 240 hour(s))  Blood Culture (routine x 2)     Status: None   Collection Time: 09/25/19  1:29 PM   Specimen: BLOOD  Result Value Ref Range Status   Specimen Description BLOOD BLOOD RIGHT WRIST  Final   Special Requests   Final    BOTTLES DRAWN AEROBIC AND ANAEROBIC Blood Culture adequate volume   Culture   Final    NO  GROWTH 5 DAYS Performed at [facility], [address], [place], [place] [address]    Report Status 09/30/2019 FINAL  Final  Blood Culture (routine x 2)     Status: None   Collection Time: 09/25/19  1:29 PM   Specimen: BLOOD  Result Value Ref Range Status   Specimen Description BLOOD RIGHT ANTECUBITAL  Final   Special Requests   Final    BOTTLES DRAWN AEROBIC AND ANAEROBIC Blood Culture adequate volume   Culture   Final  NO GROWTH 5 DAYS Performed at [facility], [address] [place], [place] [address]    Report Status 09/30/2019 FINAL  Final  MRSA PCR Screening     Status: None   Collection Time: 09/26/19 12:59 AM   Specimen: Nasopharyngeal  Result Value Ref Range Status   MRSA by PCR NEGATIVE NEGATIVE Final    Comment:        The GeneXpert MRSA Assay (FDA approved for NASAL specimens only), is one component of a comprehensive MRSA colonization surveillance program. It is not intended to diagnose MRSA infection nor to guide or monitor treatment for MRSA infections. Performed at [facility], [address] [place], [place] [address]      Labs: BNP (last 3 results) Recent Labs    09/25/19 1309  BNP 0000000   Basic Metabolic Panel: Recent Labs  Lab 09/27/19 0429 09/28/19 0611 09/29/19 0550 09/30/19 0718 10/01/19 1000  NA 142 140 142 140 136  K 4.5 4.6 5.0 5.0 4.6  CL 110 112* 112* 109 109  CO2 18* 17* 21* 21* 18*  GLUCOSE 221* 201* 213* 161* 230*  BUN 64* 65* 62* 55* 57*  CREATININE 1.11* 0.92 0.93 0.83 0.84  CALCIUM 9.2 9.1 9.1 8.8* 8.6*  MG 2.9* 2.8* 2.8* 2.6* 2.6*   Liver Function Tests: Recent Labs  Lab 09/27/19 0429 09/28/19 0611 09/29/19 0550 09/30/19 0718  AST 17 23 17 18   ALT 15 19 17 19   ALKPHOS 61 56 58 54  BILITOT 0.6 0.6 0.6 0.6  PROT 6.6 6.2* 6.5 5.9*  ALBUMIN 3.1* 3.0* 3.1* 2.9*   No results for input(s): LIPASE, AMYLASE in the last 168 hours. No results for input(s): AMMONIA in the last 168  hours. CBC: Recent Labs  Lab 09/27/19 0429 09/28/19 0611 09/29/19 0550 09/30/19 0718  WBC 8.7 8.6 9.0 7.4  NEUTROABS 7.4 7.4 7.1 5.5  HGB 13.4 12.8 13.1 13.0  HCT 40.3 38.5 39.4 38.8  MCV 91.6 91.7 92.1 90.0  PLT 303 321 322 306   Cardiac Enzymes: No results for input(s): CKTOTAL, CKMB, CKMBINDEX, TROPONINI in the last 168 hours. BNP: Invalid input(s): POCBNP CBG: Recent Labs  Lab 10/02/19 1202 10/02/19 1649 10/02/19 2037 10/03/19 0813 10/03/19 1137  GLUCAP 275* 229* 270* 139* 147*   D-Dimer No results for input(s): DDIMER in the last 72 hours. Hgb A1c No results for input(s): HGBA1C in the last 72 hours. Lipid Profile No results for input(s): CHOL, HDL, LDLCALC, TRIG, CHOLHDL, LDLDIRECT in the last 72 hours. Thyroid function studies No results for input(s): TSH, T4TOTAL, T3FREE, THYROIDAB in the last 72 hours.  Invalid input(s): FREET3 Anemia work up Recent Labs    10/01/19 1000  FERRITIN 430*   Urinalysis    Component Value Date/Time   COLORURINE STRAW (A) 01/02/2017 1428   APPEARANCEUR HAZY (A) 01/02/2017 1428   LABSPEC 1.028 01/02/2017 1428   PHURINE 5.0 01/02/2017 1428   GLUCOSEU >=500 (A) 01/02/2017 1428   HGBUR [place] (A) 01/02/2017 1428   BILIRUBINUR [place] 09/20/2019 1358   KETONESUR 5 (A) 01/02/2017 1428   PROTEINUR Positive (A) 09/20/2019 1358   PROTEINUR NEGATIVE 01/02/2017 1428   UROBILINOGEN 0.2 09/20/2019 1358   NITRITE Negative 09/20/2019 1358   NITRITE NEGATIVE 01/02/2017 1428   LEUKOCYTESUR Negative 09/20/2019 1358   Sepsis Labs Invalid input(s): PROCALCITONIN,  WBC,  LACTICIDVEN Microbiology Recent Results (from the past 240 hour(s))  Blood Culture (routine x 2)     Status: None   Collection Time:  09/25/19  1:29 PM   Specimen: BLOOD  Result Value Ref Range Status   Specimen Description BLOOD BLOOD RIGHT WRIST  Final   Special Requests   Final    BOTTLES DRAWN AEROBIC AND ANAEROBIC Blood Culture adequate volume   Culture    Final    NO GROWTH 5 DAYS Performed at [facility], [address] [place], [place] [address]    Report Status 09/30/2019 FINAL  Final  Blood Culture (routine x 2)     Status: None   Collection Time: 09/25/19  1:29 PM   Specimen: BLOOD  Result Value Ref Range Status   Specimen Description BLOOD RIGHT ANTECUBITAL  Final   Special Requests   Final    BOTTLES DRAWN AEROBIC AND ANAEROBIC Blood Culture adequate volume   Culture   Final    NO GROWTH 5 DAYS Performed at [facility], [address] [place], [place] [address]    Report Status 09/30/2019 FINAL  Final  MRSA PCR Screening     Status: None   Collection Time: 09/26/19 12:59 AM   Specimen: Nasopharyngeal  Result Value Ref Range Status   MRSA by PCR NEGATIVE NEGATIVE Final    Comment:        The GeneXpert MRSA Assay (FDA approved for NASAL specimens only), is one component of a comprehensive MRSA colonization surveillance program. It is not intended to diagnose MRSA infection nor to guide or monitor treatment for MRSA infections. Performed at [facility], [address], [place], [place] [address]      Time coordinating discharge: 35 minutes      SIGNED:   Edwin Dada, MD  [facility] 10/03/2019, 12:37 PM

## 2019-10-04 DIAGNOSIS — N390 Urinary tract infection, site not specified: Secondary | ICD-10-CM | POA: Diagnosis not present

## 2019-10-04 DIAGNOSIS — J1289 Other viral pneumonia: Secondary | ICD-10-CM | POA: Diagnosis not present

## 2019-10-04 DIAGNOSIS — R531 Weakness: Secondary | ICD-10-CM | POA: Diagnosis not present

## 2019-10-04 DIAGNOSIS — U071 COVID-19: Secondary | ICD-10-CM | POA: Diagnosis not present

## 2019-10-06 NOTE — Progress Notes (Signed)
PROGRESS NOTE    Lori Berger  [id number] DOB: 09-15-1941 DOA: 09/25/2019  PCP: Jerrol Banana., MD    LOS - 7  Brief Narrative:  79 y.o.femalewith medical history significant ofhypertension, diabetes, arthritis, hyperlipidemia, depression. She presented to the ED from her SNF after an apparent fall, after beingin the EDday before alsofor a fallat which timeall imaging that time was negative. Per report, SNF staff reported to the EMS that patient had a near fall today, did not actually hit the ground.Patient is known recently tested positive for COVID-19. This visit, patienthypoxic requiring 2 L/min of oxygen to maintain her O2 sat over 90%. She denies respiratory or GI symptoms, but reportsfatigued and generally weak recently.In the ED,O2 sat 87% on room air, improved to mid 90s on 2 L/min oxygen. BP92/45. Tachypneic, tachycardic. Chest x-ray showed patchy densities in both lung bases consistent with atelectasis or pneumonia. Admitted to monitored bed as inpatient for further managementof acute hypoxic respiratory failure secondary to Covid-19Pneumonia.  Subjective 12/28: Awaiting SNF placement.   Assessment & Plan:   Principal Problem:   Pneumonia due to COVID-19 virus Active Problems:   Diabetes mellitus, type 2 ([place])   Essential (primary) hypertension   Acute lower UTI   Acute respiratory failure with hypoxia (HCC)   Acute respiratory failure with hypoxia secondary to COVID-19 pneumonia Hypoxia resolved, had been requiring 2 L/min with sats upper 90's. --Supplemental oxygen PRN only now, maintain O2 sat>90% -S/P Remdesivir per pharmacy protocol - last day 12/26 - continue Decadron IV until discharge (may treat for a total of 10 days) since she was hypoxic -Vitamin C and zinc -Airborne and contact precautions -Combivent inhaler changed -Antitussives continue -Awaiting placement to SNF  Acute lower UTI, present on admission: No acute  issues  Urine culture of 12/17 reviewed, this grew staph epidermidis sensitive to Bactrim. Had just been started on Bactrim outpatient. --completed course of Bactrim  Type 2 diabetes Home regimen is Invokana-Metformin. --Sliding scale sensitive Novolog  Essential hypertension- But currently running low to low normal range -Cont to hold home amlodipine and lisinopril for now - May give Riffe ivF bolus  --Monitor blood pressure closely and resume meds as indicated  Coronary artery disease-stable -Continue aspirin and Plavix  Hyperlipidemia --Continue simvastatin  History of breast and endometrial cancer --Continue home letrozole - F/U OP   Depression: Per family  - Pt was not any scheduled meds at home  - Pt denies any SI/ST of HI/HT and hallucination  - Family/ daughter in law insists on inpatient psych consult. Explained there is no indication for IP psych at this time. And Pt doesn't need to stay in hospital for that.  - However, if Pt ends up staying for any other reason, may consult psych  - May f/u OP with PCP as well   DVT prophylaxis:Lovenox Code Status: DNR Family Communication: Spoke with daughter in Sports coach, Mariann Laster. Thinks Pt is depressed and asking for Psych eval. - Explained family that Pt has no typical S/S of depression for which she needs immediate or inpatient psych eval. Also explained, besides age, post CoVid-19 effect could be more likely, which is very common among this age group.  - Daughter in law disagreed.  - Stressed to family/ daughter in Sports coach, that we remain respectful to their view or opinion but not necessarily always in agreement medically.   Disposition Plan:Pending SNF/rehab placement. Case manager working on it.   Consultants:  none  Procedures:  none  Antimicrobials:  none  Objective: Vitals:   10/02/19 1202 10/02/19 2043 10/03/19 0452 10/03/19 1139  BP: 91/64 117/63 110/72 105/67  Pulse: 72 91 (!) 57 75   Resp:  18 18   Temp: 97.9 F (36.6 C) 98 F (36.7 C) 97.8 F (36.6 C) (!) 97.5 F (36.4 C)  TempSrc:  Oral Oral Oral  SpO2: 97% 95% 100% 99%  Weight:      Height:       No intake or output data in the 24 hours ending 10/06/19 1445 Filed Weights   09/25/19 2106 10/01/19 1446  Weight: 74.8 kg 68.1 kg    Examination:  General exam: awake, alert, no acute distress HEENT: moist mucus membranes, hearing grossly normal  Respiratory system: clear to auscultation bilaterally, no wheezes, rales or rhonchi, normal respiratory effort. Cardiovascular system: normal S1/S2, RRR, no JVD, murmurs, rubs, gallops, no pedal edema.   Gastrointestinal system: soft, non-tender, non-distended abdomen, normal bowel sounds. Central nervous system: alert and oriented x3. no gross focal neurologic deficits, normal speech Extremities: moves all, no cyanosis, normal tone Skin: dry, intact, normal temperature,  Psychiatry: normal mood, congruent affect. Denies any homicidal or suicidal thoughts or ideas. Denies any hallucination.    Data Reviewed: I have personally reviewed following labs and imaging studies  CBC: Recent Labs  Lab 09/30/19 0718  WBC 7.4  NEUTROABS 5.5  HGB 13.0  HCT 38.8  MCV 90.0  PLT [id number]   Basic Metabolic Panel: Recent Labs  Lab 09/30/19 0718 10/01/19 1000  NA 140 136  K 5.0 4.6  CL 109 109  CO2 21* 18*  GLUCOSE 161* 230*  BUN 55* 57*  CREATININE 0.83 0.84  CALCIUM 8.8* 8.6*  MG 2.6* 2.6*   GFR: Estimated Creatinine Clearance: 50.7 mL/min (by C-G formula based on SCr of 0.84 mg/dL). Liver Function Tests: Recent Labs  Lab 09/30/19 0718  AST 18  ALT 19  ALKPHOS 54  BILITOT 0.6  PROT 5.9*  ALBUMIN 2.9*   No results for input(s): LIPASE, AMYLASE in the last 168 hours. No results for input(s): AMMONIA in the last 168 hours. Coagulation Profile: No results for input(s): INR, PROTIME in the last 168 hours. Cardiac Enzymes: No results for input(s): CKTOTAL,  CKMB, CKMBINDEX, TROPONINI in the last 168 hours. BNP (last 3 results) No results for input(s): PROBNP in the last 8760 hours. HbA1C: No results for input(s): HGBA1C in the last 72 hours. CBG: Recent Labs  Lab 10/02/19 1649 10/02/19 2037 10/03/19 0813 10/03/19 1137 10/03/19 1629  GLUCAP 229* 270* 139* 147* 226*   Lipid Profile: No results for input(s): CHOL, HDL, LDLCALC, TRIG, CHOLHDL, LDLDIRECT in the last 72 hours. Thyroid Function Tests: No results for input(s): TSH, T4TOTAL, FREET4, T3FREE, THYROIDAB in the last 72 hours. Anemia Panel: No results for input(s): VITAMINB12, FOLATE, FERRITIN, TIBC, IRON, RETICCTPCT in the last 72 hours. Sepsis Labs: No results for input(s): PROCALCITON, LATICACIDVEN in the last 168 hours.  No results found for this or any previous visit (from the past 240 hour(s)).       Radiology Studies: No results found.      Scheduled Meds:  Continuous Infusions:   LOS: 7 days  Time spent: 30 to 35 minutes  Thornell Mule, MD [facility]   If 7PM-7AM, please contact night-coverage [email or web address] Password [facility] 10/02/2019

## 2019-10-09 DIAGNOSIS — N390 Urinary tract infection, site not specified: Secondary | ICD-10-CM | POA: Diagnosis not present

## 2019-10-09 DIAGNOSIS — R531 Weakness: Secondary | ICD-10-CM | POA: Diagnosis not present

## 2019-10-11 ENCOUNTER — Encounter: Payer: Self-pay | Admitting: Family Medicine

## 2019-10-12 DIAGNOSIS — J1289 Other viral pneumonia: Secondary | ICD-10-CM | POA: Diagnosis not present

## 2019-10-12 DIAGNOSIS — R531 Weakness: Secondary | ICD-10-CM | POA: Diagnosis not present

## 2019-10-12 DIAGNOSIS — R799 Abnormal finding of blood chemistry, unspecified: Secondary | ICD-10-CM | POA: Diagnosis not present

## 2019-10-12 DIAGNOSIS — U071 COVID-19: Secondary | ICD-10-CM | POA: Diagnosis not present

## 2019-10-15 DIAGNOSIS — R410 Disorientation, unspecified: Secondary | ICD-10-CM | POA: Diagnosis not present

## 2019-10-15 DIAGNOSIS — R531 Weakness: Secondary | ICD-10-CM | POA: Diagnosis not present

## 2019-10-17 DIAGNOSIS — R531 Weakness: Secondary | ICD-10-CM | POA: Diagnosis not present

## 2019-10-17 DIAGNOSIS — R410 Disorientation, unspecified: Secondary | ICD-10-CM | POA: Diagnosis not present

## 2019-10-24 DIAGNOSIS — I10 Essential (primary) hypertension: Secondary | ICD-10-CM | POA: Diagnosis not present

## 2019-10-24 DIAGNOSIS — E119 Type 2 diabetes mellitus without complications: Secondary | ICD-10-CM | POA: Diagnosis not present

## 2019-10-24 DIAGNOSIS — R531 Weakness: Secondary | ICD-10-CM | POA: Diagnosis not present

## 2019-10-24 DIAGNOSIS — R799 Abnormal finding of blood chemistry, unspecified: Secondary | ICD-10-CM | POA: Diagnosis not present

## 2019-10-29 DIAGNOSIS — M6281 Muscle weakness (generalized): Secondary | ICD-10-CM | POA: Diagnosis not present

## 2019-10-30 DIAGNOSIS — M6281 Muscle weakness (generalized): Secondary | ICD-10-CM | POA: Diagnosis not present

## 2019-10-31 DIAGNOSIS — R531 Weakness: Secondary | ICD-10-CM | POA: Diagnosis not present

## 2019-10-31 DIAGNOSIS — M6281 Muscle weakness (generalized): Secondary | ICD-10-CM | POA: Diagnosis not present

## 2019-10-31 DIAGNOSIS — E119 Type 2 diabetes mellitus without complications: Secondary | ICD-10-CM | POA: Diagnosis not present

## 2019-11-01 DIAGNOSIS — M6281 Muscle weakness (generalized): Secondary | ICD-10-CM | POA: Diagnosis not present

## 2019-11-02 DIAGNOSIS — M6281 Muscle weakness (generalized): Secondary | ICD-10-CM | POA: Diagnosis not present

## 2019-11-05 DIAGNOSIS — M6281 Muscle weakness (generalized): Secondary | ICD-10-CM | POA: Diagnosis not present

## 2019-11-06 DIAGNOSIS — M6281 Muscle weakness (generalized): Secondary | ICD-10-CM | POA: Diagnosis not present

## 2019-11-07 DIAGNOSIS — M6281 Muscle weakness (generalized): Secondary | ICD-10-CM | POA: Diagnosis not present

## 2019-11-08 DIAGNOSIS — Z79899 Other long term (current) drug therapy: Secondary | ICD-10-CM | POA: Diagnosis not present

## 2019-11-08 DIAGNOSIS — M6281 Muscle weakness (generalized): Secondary | ICD-10-CM | POA: Diagnosis not present

## 2019-11-09 DIAGNOSIS — M6281 Muscle weakness (generalized): Secondary | ICD-10-CM | POA: Diagnosis not present

## 2019-11-12 DIAGNOSIS — M6281 Muscle weakness (generalized): Secondary | ICD-10-CM | POA: Diagnosis not present

## 2019-11-13 DIAGNOSIS — Z79899 Other long term (current) drug therapy: Secondary | ICD-10-CM | POA: Diagnosis not present

## 2019-11-13 DIAGNOSIS — I10 Essential (primary) hypertension: Secondary | ICD-10-CM | POA: Diagnosis not present

## 2019-11-13 DIAGNOSIS — R6889 Other general symptoms and signs: Secondary | ICD-10-CM | POA: Diagnosis not present

## 2019-11-13 DIAGNOSIS — M6281 Muscle weakness (generalized): Secondary | ICD-10-CM | POA: Diagnosis not present

## 2019-11-14 DIAGNOSIS — M6281 Muscle weakness (generalized): Secondary | ICD-10-CM | POA: Diagnosis not present

## 2019-11-14 DIAGNOSIS — Z79899 Other long term (current) drug therapy: Secondary | ICD-10-CM | POA: Diagnosis not present

## 2019-11-14 DIAGNOSIS — I10 Essential (primary) hypertension: Secondary | ICD-10-CM | POA: Diagnosis not present

## 2019-11-15 DIAGNOSIS — M6281 Muscle weakness (generalized): Secondary | ICD-10-CM | POA: Diagnosis not present

## 2019-11-16 DIAGNOSIS — M6281 Muscle weakness (generalized): Secondary | ICD-10-CM | POA: Diagnosis not present

## 2019-11-18 DIAGNOSIS — N390 Urinary tract infection, site not specified: Secondary | ICD-10-CM | POA: Diagnosis not present

## 2019-11-19 DIAGNOSIS — N390 Urinary tract infection, site not specified: Secondary | ICD-10-CM | POA: Diagnosis not present

## 2019-11-19 DIAGNOSIS — R197 Diarrhea, unspecified: Secondary | ICD-10-CM | POA: Diagnosis not present

## 2019-11-19 DIAGNOSIS — M6281 Muscle weakness (generalized): Secondary | ICD-10-CM | POA: Diagnosis not present

## 2019-11-20 DIAGNOSIS — D649 Anemia, unspecified: Secondary | ICD-10-CM | POA: Diagnosis not present

## 2019-11-20 DIAGNOSIS — M6281 Muscle weakness (generalized): Secondary | ICD-10-CM | POA: Diagnosis not present

## 2019-11-20 DIAGNOSIS — N390 Urinary tract infection, site not specified: Secondary | ICD-10-CM | POA: Diagnosis not present

## 2019-11-21 DIAGNOSIS — U071 COVID-19: Secondary | ICD-10-CM | POA: Diagnosis not present

## 2019-11-21 DIAGNOSIS — R8281 Pyuria: Secondary | ICD-10-CM | POA: Diagnosis not present

## 2019-11-21 DIAGNOSIS — I10 Essential (primary) hypertension: Secondary | ICD-10-CM | POA: Diagnosis not present

## 2019-11-21 DIAGNOSIS — E8809 Other disorders of plasma-protein metabolism, not elsewhere classified: Secondary | ICD-10-CM | POA: Diagnosis not present

## 2019-11-21 DIAGNOSIS — M6281 Muscle weakness (generalized): Secondary | ICD-10-CM | POA: Diagnosis not present

## 2019-11-22 DIAGNOSIS — M6281 Muscle weakness (generalized): Secondary | ICD-10-CM | POA: Diagnosis not present

## 2019-11-24 DIAGNOSIS — M6281 Muscle weakness (generalized): Secondary | ICD-10-CM | POA: Diagnosis not present

## 2019-11-26 DIAGNOSIS — M6281 Muscle weakness (generalized): Secondary | ICD-10-CM | POA: Diagnosis not present

## 2019-11-27 DIAGNOSIS — M6281 Muscle weakness (generalized): Secondary | ICD-10-CM | POA: Diagnosis not present

## 2019-11-28 DIAGNOSIS — M6281 Muscle weakness (generalized): Secondary | ICD-10-CM | POA: Diagnosis not present

## 2019-11-30 DIAGNOSIS — M6281 Muscle weakness (generalized): Secondary | ICD-10-CM | POA: Diagnosis not present

## 2019-12-03 DIAGNOSIS — M6281 Muscle weakness (generalized): Secondary | ICD-10-CM | POA: Diagnosis not present

## 2019-12-04 DIAGNOSIS — M6281 Muscle weakness (generalized): Secondary | ICD-10-CM | POA: Diagnosis not present

## 2019-12-05 DIAGNOSIS — M6281 Muscle weakness (generalized): Secondary | ICD-10-CM | POA: Diagnosis not present

## 2019-12-06 ENCOUNTER — Encounter: Payer: Self-pay | Admitting: Family Medicine

## 2019-12-06 DIAGNOSIS — M6281 Muscle weakness (generalized): Secondary | ICD-10-CM | POA: Diagnosis not present

## 2019-12-06 NOTE — Telephone Encounter (Signed)
Spoke to [place] to let her know they were printed and ready for pickup.  cbe

## 2019-12-07 DIAGNOSIS — M6281 Muscle weakness (generalized): Secondary | ICD-10-CM | POA: Diagnosis not present

## 2019-12-10 DIAGNOSIS — M6281 Muscle weakness (generalized): Secondary | ICD-10-CM | POA: Diagnosis not present

## 2019-12-11 DIAGNOSIS — M6281 Muscle weakness (generalized): Secondary | ICD-10-CM | POA: Diagnosis not present

## 2019-12-12 DIAGNOSIS — M6281 Muscle weakness (generalized): Secondary | ICD-10-CM | POA: Diagnosis not present

## 2019-12-13 DIAGNOSIS — Z853 Personal history of malignant neoplasm of breast: Secondary | ICD-10-CM | POA: Diagnosis not present

## 2019-12-13 DIAGNOSIS — R8281 Pyuria: Secondary | ICD-10-CM | POA: Diagnosis not present

## 2019-12-13 DIAGNOSIS — R197 Diarrhea, unspecified: Secondary | ICD-10-CM | POA: Diagnosis not present

## 2019-12-13 DIAGNOSIS — M6281 Muscle weakness (generalized): Secondary | ICD-10-CM | POA: Diagnosis not present

## 2019-12-14 DIAGNOSIS — M6281 Muscle weakness (generalized): Secondary | ICD-10-CM | POA: Diagnosis not present

## 2019-12-17 DIAGNOSIS — M6281 Muscle weakness (generalized): Secondary | ICD-10-CM | POA: Diagnosis not present

## 2019-12-18 DIAGNOSIS — M6281 Muscle weakness (generalized): Secondary | ICD-10-CM | POA: Diagnosis not present

## 2019-12-19 DIAGNOSIS — I10 Essential (primary) hypertension: Secondary | ICD-10-CM | POA: Diagnosis not present

## 2019-12-19 DIAGNOSIS — M6281 Muscle weakness (generalized): Secondary | ICD-10-CM | POA: Diagnosis not present

## 2019-12-19 DIAGNOSIS — E119 Type 2 diabetes mellitus without complications: Secondary | ICD-10-CM | POA: Diagnosis not present

## 2019-12-19 DIAGNOSIS — R600 Localized edema: Secondary | ICD-10-CM | POA: Diagnosis not present

## 2019-12-19 DIAGNOSIS — M79605 Pain in left leg: Secondary | ICD-10-CM | POA: Diagnosis not present

## 2019-12-19 DIAGNOSIS — M7989 Other specified soft tissue disorders: Secondary | ICD-10-CM | POA: Diagnosis not present

## 2019-12-19 DIAGNOSIS — W19XXXA Unspecified fall, initial encounter: Secondary | ICD-10-CM | POA: Diagnosis not present

## 2019-12-19 DIAGNOSIS — R531 Weakness: Secondary | ICD-10-CM | POA: Diagnosis not present

## 2019-12-20 DIAGNOSIS — I82412 Acute embolism and thrombosis of left femoral vein: Secondary | ICD-10-CM | POA: Diagnosis not present

## 2019-12-20 DIAGNOSIS — M6281 Muscle weakness (generalized): Secondary | ICD-10-CM | POA: Diagnosis not present

## 2019-12-22 DIAGNOSIS — M6281 Muscle weakness (generalized): Secondary | ICD-10-CM | POA: Diagnosis not present

## 2019-12-24 DIAGNOSIS — M6281 Muscle weakness (generalized): Secondary | ICD-10-CM | POA: Diagnosis not present

## 2019-12-25 DIAGNOSIS — M6281 Muscle weakness (generalized): Secondary | ICD-10-CM | POA: Diagnosis not present

## 2019-12-26 ENCOUNTER — Other Ambulatory Visit: Payer: Self-pay

## 2019-12-26 ENCOUNTER — Ambulatory Visit: Payer: Medicare Other | Admitting: Oncology

## 2019-12-26 DIAGNOSIS — R600 Localized edema: Secondary | ICD-10-CM | POA: Diagnosis not present

## 2019-12-26 DIAGNOSIS — I82412 Acute embolism and thrombosis of left femoral vein: Secondary | ICD-10-CM | POA: Diagnosis not present

## 2019-12-26 NOTE — Patient Outreach (Signed)
[facility]) Care Management  12/26/2019  Lori Berger 08/13/1941 [id number]   Medication Adherence call to [name] ,patients telephone number belongs to some one else. [name] is showing past due on Invocana 150/40 mg under May.   [place] Management Direct Dial [phone]  Fax [phone] [name]

## 2019-12-27 DIAGNOSIS — M6281 Muscle weakness (generalized): Secondary | ICD-10-CM | POA: Diagnosis not present

## 2019-12-28 DIAGNOSIS — M6281 Muscle weakness (generalized): Secondary | ICD-10-CM | POA: Diagnosis not present

## 2019-12-29 DIAGNOSIS — M6281 Muscle weakness (generalized): Secondary | ICD-10-CM | POA: Diagnosis not present

## 2019-12-31 DIAGNOSIS — M6281 Muscle weakness (generalized): Secondary | ICD-10-CM | POA: Diagnosis not present

## 2020-01-01 DIAGNOSIS — M6281 Muscle weakness (generalized): Secondary | ICD-10-CM | POA: Diagnosis not present

## 2020-01-02 DIAGNOSIS — M6281 Muscle weakness (generalized): Secondary | ICD-10-CM | POA: Diagnosis not present

## 2020-01-02 DIAGNOSIS — N390 Urinary tract infection, site not specified: Secondary | ICD-10-CM | POA: Diagnosis not present

## 2020-01-03 DIAGNOSIS — I639 Cerebral infarction, unspecified: Secondary | ICD-10-CM | POA: Diagnosis not present

## 2020-01-03 DIAGNOSIS — N390 Urinary tract infection, site not specified: Secondary | ICD-10-CM | POA: Diagnosis not present

## 2020-01-03 DIAGNOSIS — R2681 Unsteadiness on feet: Secondary | ICD-10-CM | POA: Diagnosis not present

## 2020-01-03 DIAGNOSIS — M6281 Muscle weakness (generalized): Secondary | ICD-10-CM | POA: Diagnosis not present

## 2020-01-03 DIAGNOSIS — R2689 Other abnormalities of gait and mobility: Secondary | ICD-10-CM | POA: Diagnosis not present

## 2020-01-03 DIAGNOSIS — R262 Difficulty in walking, not elsewhere classified: Secondary | ICD-10-CM | POA: Diagnosis not present

## 2020-01-03 DIAGNOSIS — R279 Unspecified lack of coordination: Secondary | ICD-10-CM | POA: Diagnosis not present

## 2020-01-03 DIAGNOSIS — R1312 Dysphagia, oropharyngeal phase: Secondary | ICD-10-CM | POA: Diagnosis not present

## 2020-01-04 DIAGNOSIS — I82412 Acute embolism and thrombosis of left femoral vein: Secondary | ICD-10-CM | POA: Diagnosis not present

## 2020-01-04 DIAGNOSIS — E119 Type 2 diabetes mellitus without complications: Secondary | ICD-10-CM | POA: Diagnosis not present

## 2020-01-04 DIAGNOSIS — N390 Urinary tract infection, site not specified: Secondary | ICD-10-CM | POA: Diagnosis not present

## 2020-01-04 DIAGNOSIS — T50905A Adverse effect of unspecified drugs, medicaments and biological substances, initial encounter: Secondary | ICD-10-CM | POA: Diagnosis not present

## 2020-01-05 DIAGNOSIS — R319 Hematuria, unspecified: Secondary | ICD-10-CM | POA: Diagnosis not present

## 2020-01-05 DIAGNOSIS — N390 Urinary tract infection, site not specified: Secondary | ICD-10-CM | POA: Diagnosis not present

## 2020-01-09 DIAGNOSIS — N390 Urinary tract infection, site not specified: Secondary | ICD-10-CM | POA: Diagnosis not present

## 2020-01-09 DIAGNOSIS — E119 Type 2 diabetes mellitus without complications: Secondary | ICD-10-CM | POA: Diagnosis not present

## 2020-01-14 DIAGNOSIS — R300 Dysuria: Secondary | ICD-10-CM | POA: Diagnosis not present

## 2020-01-14 DIAGNOSIS — R197 Diarrhea, unspecified: Secondary | ICD-10-CM | POA: Diagnosis not present

## 2020-01-16 DIAGNOSIS — E119 Type 2 diabetes mellitus without complications: Secondary | ICD-10-CM | POA: Diagnosis not present

## 2020-01-18 DIAGNOSIS — U071 COVID-19: Secondary | ICD-10-CM | POA: Diagnosis not present

## 2020-01-21 DIAGNOSIS — I82412 Acute embolism and thrombosis of left femoral vein: Secondary | ICD-10-CM | POA: Diagnosis not present

## 2020-01-21 DIAGNOSIS — R262 Difficulty in walking, not elsewhere classified: Secondary | ICD-10-CM | POA: Diagnosis not present

## 2020-01-21 DIAGNOSIS — N390 Urinary tract infection, site not specified: Secondary | ICD-10-CM | POA: Diagnosis not present

## 2020-01-21 DIAGNOSIS — R2689 Other abnormalities of gait and mobility: Secondary | ICD-10-CM | POA: Diagnosis not present

## 2020-01-21 DIAGNOSIS — I639 Cerebral infarction, unspecified: Secondary | ICD-10-CM | POA: Diagnosis not present

## 2020-01-21 DIAGNOSIS — R1312 Dysphagia, oropharyngeal phase: Secondary | ICD-10-CM | POA: Diagnosis not present

## 2020-01-21 DIAGNOSIS — R410 Disorientation, unspecified: Secondary | ICD-10-CM | POA: Diagnosis not present

## 2020-01-21 DIAGNOSIS — R2681 Unsteadiness on feet: Secondary | ICD-10-CM | POA: Diagnosis not present

## 2020-01-21 DIAGNOSIS — R279 Unspecified lack of coordination: Secondary | ICD-10-CM | POA: Diagnosis not present

## 2020-01-21 DIAGNOSIS — M6281 Muscle weakness (generalized): Secondary | ICD-10-CM | POA: Diagnosis not present

## 2020-01-22 DIAGNOSIS — R262 Difficulty in walking, not elsewhere classified: Secondary | ICD-10-CM | POA: Diagnosis not present

## 2020-01-22 DIAGNOSIS — M6281 Muscle weakness (generalized): Secondary | ICD-10-CM | POA: Diagnosis not present

## 2020-01-22 DIAGNOSIS — R2689 Other abnormalities of gait and mobility: Secondary | ICD-10-CM | POA: Diagnosis not present

## 2020-01-22 DIAGNOSIS — R279 Unspecified lack of coordination: Secondary | ICD-10-CM | POA: Diagnosis not present

## 2020-01-22 DIAGNOSIS — I639 Cerebral infarction, unspecified: Secondary | ICD-10-CM | POA: Diagnosis not present

## 2020-01-22 DIAGNOSIS — R2681 Unsteadiness on feet: Secondary | ICD-10-CM | POA: Diagnosis not present

## 2020-01-22 DIAGNOSIS — N390 Urinary tract infection, site not specified: Secondary | ICD-10-CM | POA: Diagnosis not present

## 2020-01-22 DIAGNOSIS — R1312 Dysphagia, oropharyngeal phase: Secondary | ICD-10-CM | POA: Diagnosis not present

## 2020-01-23 DIAGNOSIS — R262 Difficulty in walking, not elsewhere classified: Secondary | ICD-10-CM | POA: Diagnosis not present

## 2020-01-23 DIAGNOSIS — R2689 Other abnormalities of gait and mobility: Secondary | ICD-10-CM | POA: Diagnosis not present

## 2020-01-23 DIAGNOSIS — B3749 Other urogenital candidiasis: Secondary | ICD-10-CM | POA: Diagnosis not present

## 2020-01-23 DIAGNOSIS — R1312 Dysphagia, oropharyngeal phase: Secondary | ICD-10-CM | POA: Diagnosis not present

## 2020-01-23 DIAGNOSIS — R279 Unspecified lack of coordination: Secondary | ICD-10-CM | POA: Diagnosis not present

## 2020-01-23 DIAGNOSIS — I82412 Acute embolism and thrombosis of left femoral vein: Secondary | ICD-10-CM | POA: Diagnosis not present

## 2020-01-23 DIAGNOSIS — M6281 Muscle weakness (generalized): Secondary | ICD-10-CM | POA: Diagnosis not present

## 2020-01-23 DIAGNOSIS — N390 Urinary tract infection, site not specified: Secondary | ICD-10-CM | POA: Diagnosis not present

## 2020-01-23 DIAGNOSIS — R2681 Unsteadiness on feet: Secondary | ICD-10-CM | POA: Diagnosis not present

## 2020-01-23 DIAGNOSIS — I639 Cerebral infarction, unspecified: Secondary | ICD-10-CM | POA: Diagnosis not present

## 2020-01-24 DIAGNOSIS — D519 Vitamin B12 deficiency anemia, unspecified: Secondary | ICD-10-CM | POA: Diagnosis not present

## 2020-01-24 DIAGNOSIS — R262 Difficulty in walking, not elsewhere classified: Secondary | ICD-10-CM | POA: Diagnosis not present

## 2020-01-24 DIAGNOSIS — R2689 Other abnormalities of gait and mobility: Secondary | ICD-10-CM | POA: Diagnosis not present

## 2020-01-24 DIAGNOSIS — R2681 Unsteadiness on feet: Secondary | ICD-10-CM | POA: Diagnosis not present

## 2020-01-24 DIAGNOSIS — D649 Anemia, unspecified: Secondary | ICD-10-CM | POA: Diagnosis not present

## 2020-01-24 DIAGNOSIS — M6281 Muscle weakness (generalized): Secondary | ICD-10-CM | POA: Diagnosis not present

## 2020-01-24 DIAGNOSIS — I639 Cerebral infarction, unspecified: Secondary | ICD-10-CM | POA: Diagnosis not present

## 2020-01-24 DIAGNOSIS — R1312 Dysphagia, oropharyngeal phase: Secondary | ICD-10-CM | POA: Diagnosis not present

## 2020-01-24 DIAGNOSIS — R279 Unspecified lack of coordination: Secondary | ICD-10-CM | POA: Diagnosis not present

## 2020-01-24 DIAGNOSIS — N390 Urinary tract infection, site not specified: Secondary | ICD-10-CM | POA: Diagnosis not present

## 2020-01-24 NOTE — Progress Notes (Signed)
Subjective:   Lori Berger is a 79 y.o. female who presents for Medicare Annual (Subsequent) preventive examination.    This visit is being conducted through telemedicine due to the COVID-19 pandemic. This patient has given me verbal consent via doximity to conduct this visit, patient states they are participating from their home address. Some vital signs may be absent or patient reported.    Patient identification: identified by name, DOB, and current address  Review of Systems:  N/A  Cardiac Risk Factors include: advanced age (>90men, >32 women);diabetes mellitus;dyslipidemia     Objective:     Vitals: There were no vitals taken for this visit.  There is no height or weight on file to calculate BMI. Unable to obtain vitals due to visit being conducted via telephonically.   Advanced Directives 01/28/2020 09/25/2019 09/25/2019 09/24/2019 09/08/2018 01/04/2018 01/02/2017  Does Patient Have a Medical Advance Directive? Yes Yes No Yes No Yes Yes  Type of Paramedic of [place];Living will Living will;Healthcare Power of [place];Living will [place];Living will;Out of facility DNR (pink MOST or yellow form)  Does patient want to make changes to medical advance directive? - No - Patient declined - - - - Yes (Inpatient - patient defers changing a medical advance directive at this time)  Copy of [place] in Chart? No - copy requested Yes - validated most recent copy scanned in chart (See row information) - - - No - copy requested No - copy requested  Would patient like information on creating a medical advance directive? - No - Patient declined No - Patient declined - No - Patient declined - Yes (Inpatient - patient defers creating a medical advance directive at this time)    Tobacco Social History   Tobacco Use  Smoking Status Never Smoker  Smokeless Tobacco Never Used     Counseling given:  Not Answered   Clinical Intake:  Pre-visit preparation completed: Yes  Pain : No/denies pain Pain Score: 0-No pain Pain Type: Chronic pain     Nutritional Risks: None Diabetes: Yes  How often do you need to have someone help you when you read instructions, pamphlets, or other written materials from your doctor or pharmacy?: 1 - Never   Diabetes:  Is the patient diabetic?  Yes  If diabetic, was a CBG obtained today?  No  Did the patient bring in their glucometer from home?  No  How often do you monitor your CBG's? Five times a day.   Financial Strains and Diabetes Management:  Are you having any financial strains with the device, your supplies or your medication? No .  Does the patient want to be seen by Chronic Care Management for management of their diabetes?  No  Would the patient like to be referred to a Nutritionist or for Diabetic Management?  No   Diabetic Exams:  Diabetic Eye Exam: Completed 09/07/17. Overdue for diabetic eye exam. Pt has been advised about the importance in completing this exam.   Diabetic Foot Exam: Completed 09/13/17. Pt has been advised about the importance in completing this exam. Note made to follow up on this at next in office apt.     Interpreter Needed?: No  Information entered by :: [facility], LPN  Past Medical History:  Diagnosis Date  . Arthritis   . BCC (basal cell carcinoma)   . Breast cancer ([place]) 1990   left 1990  . Breast cancer (  [place]) 11/11/2015   Right, T2 (2.4 cm(, N0; positive deep margin, ER: 100%, PR: 60%; Her 2 neu not amplified, Mammoprint: low risk  . Diabetes mellitus without complication ([place])   . Endometrial cancer ([place])   . Hyperlipidemia   . Hypertension   . Major depressive disorder   . PONV (postoperative nausea and vomiting)   . UTI (urinary tract infection)   . Yeast infection    Past Surgical History:  Procedure Laterality Date  . ABDOMINAL HYSTERECTOMY    . MASTECTOMY Left    1990  . SENTINEL  NODE BIOPSY Right 11/11/2015   Procedure: SENTINEL NODE BIOPSY;  Surgeon: Robert Bellow, MD;  Location: [facility] ORS;  Service: General;  Laterality: Right;  . SIMPLE MASTECTOMY WITH AXILLARY SENTINEL NODE BIOPSY Right 11/11/2015   Procedure: SIMPLE MASTECTOMY;  Surgeon: Robert Bellow, MD;  Location: [facility] ORS;  Service: General;  Laterality: Right;   Family History  Problem Relation Age of Onset  . Heart disease Mother        Fatal MI age 52  . Stroke Father        fatal age 23  . Cancer Sister        Colon  . Cancer Brother        Died from Kidney cancer  . Breast cancer Neg Hx    Social History   Socioeconomic History  . Marital status: Widowed    Spouse name: widowed  . Number of children: 3  . Years of education: Not on file  . Highest education level: Some college, no degree  Occupational History  . Occupation: retired  Tobacco Use  . Smoking status: Never Smoker  . Smokeless tobacco: Never Used  Substance and Sexual Activity  . Alcohol use: No  . Drug use: No  . Sexual activity: Never  Other Topics Concern  . Not on file  Social History Narrative  . Not on file   Social Determinants of Health   Financial Resource Strain: Low Risk   . Difficulty of Paying Living Expenses: Not hard at all  Food Insecurity: No Food Insecurity  . Worried About Charity fundraiser in the Last Year: Never true  . Ran Out of Food in the Last Year: Never true  Transportation Needs: No Transportation Needs  . Lack of Transportation (Medical): No  . Lack of Transportation (Non-Medical): No  Physical Activity: Inactive  . Days of Exercise per Week: 0 days  . Minutes of Exercise per Session: 0 min  Stress: Unknown  . Feeling of Stress : Patient refused  Social Connections: Moderately Isolated  . Frequency of Communication with Friends and Family: Three times a week  . Frequency of Social Gatherings with Friends and Family: Three times a week  . Attends Religious Services: Never  .  Active Member of Clubs or Organizations: No  . Attends Archivist Meetings: Never  . Marital Status: Widowed    Outpatient Encounter Medications as of 01/28/2020  Medication Sig  . aspirin EC 81 MG tablet Take 1 tablet (81 mg total) by mouth daily.  . Canagliflozin-metFORMIN HCl (INVOKAMET) 150-500 MG TABS Take 1 tablet by mouth 2 (two) times daily.  . clopidogrel (PLAVIX) 75 MG tablet TAKE 1 TABLET BY MOUTH  DAILY (Patient taking differently: Take 75 mg by mouth daily. )  . ELIQUIS 5 MG TABS tablet Take 5 mg by mouth 2 (two) times daily.  Marland Kitchen escitalopram (LEXAPRO) 10 MG tablet Take 10 mg by  mouth daily.  . fluconazole (DIFLUCAN) 200 MG tablet Take 200 mg by mouth daily.  Marland Kitchen glucose blood test strip Check sugar daily  . guaiFENesin-dextromethorphan (ROBITUSSIN DM) 100-10 MG/5ML syrup Take 10 mLs by mouth every 4 (four) hours as needed for cough.  . letrozole (FEMARA) 2.5 MG tablet TAKE 1 TABLET BY MOUTH  DAILY (Patient taking differently: Take 2.5 mg by mouth daily. )  . Nutritional Supplements (FEEDING SUPPLEMENT, NEPRO CARB STEADY,) LIQD Take 237 mLs by mouth 3 (three) times daily between meals.  . simvastatin (ZOCOR) 10 MG tablet TAKE 1 TABLET BY MOUTH AT  BEDTIME (Patient taking differently: Take 10 mg by mouth at bedtime. )  . traZODone (DESYREL) 50 MG tablet Take 0.5 tablets (25 mg total) by mouth at bedtime as needed for sleep.  Marland Kitchen ascorbic acid (VITAMIN C) 500 MG tablet Take 1 tablet (500 mg total) by mouth daily.  . bisacodyl (DULCOLAX) 5 MG EC tablet Take 1 tablet (5 mg total) by mouth daily as needed for moderate constipation. (Patient not taking: Reported on 01/28/2020)  . Calcium-Vitamin D-Vitamin K [id number] MG-UNT-MCG TABS Take 1 tablet by mouth daily.  . insulin aspart (NOVOLOG) 100 UNIT/ML injection Inject 6 Units into the skin 3 (three) times daily with meals for 5 days.  . Ipratropium-Albuterol (COMBIVENT) 20-100 MCG/ACT AERS respimat Inhale 1 puff into the lungs every  6 (six) hours. (Patient not taking: Reported on 01/28/2020)  . mirtazapine (REMERON) 15 MG tablet Take 1 tablet (15 mg total) by mouth at bedtime.  . Multiple Vitamin (MULTIVITAMIN WITH MINERALS) TABS tablet Take 1 tablet by mouth daily. (Patient not taking: Reported on 01/28/2020)  . nystatin cream (MYCOSTATIN) Apply 1 application topically 2 (two) times daily. (Patient not taking: Reported on 01/28/2020)  . ondansetron (ZOFRAN) 4 MG tablet Take 1 tablet (4 mg total) by mouth every 6 (six) hours as needed for nausea. (Patient not taking: Reported on 01/28/2020)  . zinc sulfate 220 (50 Zn) MG capsule Take 1 capsule (220 mg total) by mouth daily. (Patient not taking: Reported on 01/28/2020)   No facility-administered encounter medications on file as of 01/28/2020.    Activities of Daily Living In your present state of health, do you have any difficulty performing the following activities: 01/28/2020 09/25/2019  Hearing? Y N  Comment Does not wear hearing aids. -  Vision? Y N  Comment Has lost her peripheral vision. -  Difficulty concentrating or making decisions? 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 -  Walking or climbing stairs? Y Y  Comment High fall risk avoid stairs. -  Dressing or bathing? Y Y  Comment Lives at a SNF full time and has a nursing aid assist. -  Doing errands, shopping? Y Y  Comment Does not drive. -  Preparing Food and eating ? Y -  Comment Does not cook. -  Using the Toilet? Y -  In the past six months, have you accidently leaked urine? Y -  Comment Incontinent, wears depends at all times. -  Do you have problems with loss of bowel control? Y -  Comment Incontinent, wears depends at all times. -  Managing your Medications? Y -  Comment SNF manages medications. -  Managing your Finances? Y -  Housekeeping or managing your Housekeeping? Y -  Comment SNF manages room cleaning. -  Some recent data might be hidden    Patient Care Team: Jerrol Banana., MD as PCP - General (Family Medicine)  Assessment:   This is a routine wellness examination for Lori Berger.  Exercise Activities and Dietary recommendations Current Exercise Habits: The patient does not participate in regular exercise at present, Exercise limited by: orthopedic condition(s);neurologic condition(s)  Goals    . DIET - INCREASE WATER INTAKE     Recommend increasing water intake to 4 glasses a day.        Fall Risk: Fall Risk  01/28/2020 01/04/2018 10/14/2016 10/14/2015 03/26/2015  Falls in the past year? 1 Yes Yes Yes No  Comment Lives at a nursing facility full time. - - - -  Number falls in past yr: 1 2 or more 1 1 -  Injury with Fall? 0 No No No -  Risk for fall due to : - Impaired balance/gait History of fall(s);Other (Comment) - -  Risk for fall due to: Comment - - pt gets dizzy spells - -  Follow up Falls prevention discussed Falls prevention discussed - - -    FALL RISK PREVENTION PERTAINING TO THE HOME:  Any stairs in or around the home? No  If so, are there any without handrails? N/A  Home free of loose throw rugs in walkways, pet beds, electrical cords, etc? Yes  Adequate lighting in your home to reduce risk of falls? Yes   ASSISTIVE DEVICES UTILIZED TO PREVENT FALLS:  Life alert? Yes  Use of a cane, walker or w/c? Yes  Grab bars in the bathroom? Yes  Shower chair or bench in shower? Yes  Elevated toilet seat or a handicapped toilet? Yes   TIMED UP AND GO:  Was the test performed? No .    Depression Screen PHQ 2/9 Scores 01/28/2020 01/04/2018 10/14/2016 10/14/2015  PHQ - 2 Score - 1 0 0  Exception Documentation Other- indicate reason in comment box - - -  Not completed Pt has memory loss. Had to complete AWV with daughter in law. - - -     Cognitive Function: Unable to complete due to wellness being completed by daughter in law.       6CIT Screen 01/04/2018 10/14/2016  What Year? 0 points 0 points  What month? 0 points 0 points  What time? 0  points 0 points  Count back from 20 0 points 0 points  Months in reverse 0 points 0 points  Repeat phrase 4 points 2 points  Total Score 4 2    Immunization History  Administered Date(s) Administered  . Fluad Quad(high Dose 65+) 07/04/2019  . Influenza Split 07/10/2011, 09/13/2012  . Influenza Whole 07/31/2017  . Influenza, High Dose Seasonal PF 06/25/2015, 10/14/2016  . Influenza,inj,Quad PF,6+ Mos 08/01/2013, 08/01/2014  . PPD Test 01/03/2017  . Pneumococcal Conjugate-13 06/06/2014  . Pneumococcal Polysaccharide-23 06/14/2012  . Tdap 05/31/2011  . Zoster 11/17/2007    Qualifies for Shingles Vaccine? Yes  Zostavax completed 11/17/07. Due for Shingrix. Pt has been advised to call insurance company to determine out of pocket expense. Advised may also receive vaccine at local pharmacy or Health Dept. Verbalized acceptance and understanding.  Tdap: Up to date  Flu Vaccine: Up to date  Pneumococcal Vaccine: Completed series  Screening Tests Health Maintenance  Topic Date Due  . URINE MICROALBUMIN  Never done  . COVID-19 Vaccine (1) Never done  . OPHTHALMOLOGY EXAM  09/07/2018  . FOOT EXAM  09/13/2018  . HEMOGLOBIN A1C  03/26/2020  . INFLUENZA VACCINE  05/04/2020  . TETANUS/TDAP  05/30/2021  . DEXA SCAN  02/16/2023  . PNA vac Low Risk Adult  Completed    Cancer Screenings:  Colorectal Screening: No longer required.   Mammogram: No longer required.   Bone Density: Completed 02/15/18. Results reflect OSTEOPENIA. Repeat every 5 years.   Lung Cancer Screening: (Low Dose CT Chest recommended if Age 62-80 years, 30 pack-year currently smoking OR have quit w/in 15years.) does not qualify.   Additional Screening:  Dental Screening: Recommended annual dental exams for proper oral hygiene   Community Resource Referral:  CRR required this visit?  No       Plan:  I have personally reviewed and addressed the Medicare Annual Wellness questionnaire and have noted the  following in the patient's chart:  A. Medical and social history B. Use of alcohol, tobacco or illicit drugs  C. Current medications and supplements D. Functional ability and status E.  Nutritional status F.  Physical activity G. Advance directives H. List of other physicians I.  Hospitalizations, surgeries, and ER visits in previous 12 months J.  [place] such as hearing and vision if needed, cognitive and depression L. Referrals and appointments   In addition, I have reviewed and discussed with patient certain preventive protocols, quality metrics, and best practice recommendations. A written personalized care plan for preventive services as well as general preventive health recommendations were provided to patient.   [place] Score, [place]  075-[facility] Nurse Health Advisor   Nurse Notes: Pt needs a urine check and a diabetic foot exam at next in office apt. Unsure if pt will return to office due to mental and physical well being. Pt is currently residing in a SNF and is being seen by their physician on site. Daughter in law to call if she has any further questions or concerns regarding patients health.

## 2020-01-25 DIAGNOSIS — N390 Urinary tract infection, site not specified: Secondary | ICD-10-CM | POA: Diagnosis not present

## 2020-01-25 DIAGNOSIS — R279 Unspecified lack of coordination: Secondary | ICD-10-CM | POA: Diagnosis not present

## 2020-01-25 DIAGNOSIS — M6281 Muscle weakness (generalized): Secondary | ICD-10-CM | POA: Diagnosis not present

## 2020-01-25 DIAGNOSIS — R2681 Unsteadiness on feet: Secondary | ICD-10-CM | POA: Diagnosis not present

## 2020-01-25 DIAGNOSIS — R2689 Other abnormalities of gait and mobility: Secondary | ICD-10-CM | POA: Diagnosis not present

## 2020-01-25 DIAGNOSIS — I639 Cerebral infarction, unspecified: Secondary | ICD-10-CM | POA: Diagnosis not present

## 2020-01-25 DIAGNOSIS — R1312 Dysphagia, oropharyngeal phase: Secondary | ICD-10-CM | POA: Diagnosis not present

## 2020-01-25 DIAGNOSIS — R262 Difficulty in walking, not elsewhere classified: Secondary | ICD-10-CM | POA: Diagnosis not present

## 2020-01-26 DIAGNOSIS — R1312 Dysphagia, oropharyngeal phase: Secondary | ICD-10-CM | POA: Diagnosis not present

## 2020-01-26 DIAGNOSIS — M6281 Muscle weakness (generalized): Secondary | ICD-10-CM | POA: Diagnosis not present

## 2020-01-26 DIAGNOSIS — R2681 Unsteadiness on feet: Secondary | ICD-10-CM | POA: Diagnosis not present

## 2020-01-26 DIAGNOSIS — R2689 Other abnormalities of gait and mobility: Secondary | ICD-10-CM | POA: Diagnosis not present

## 2020-01-26 DIAGNOSIS — I639 Cerebral infarction, unspecified: Secondary | ICD-10-CM | POA: Diagnosis not present

## 2020-01-26 DIAGNOSIS — R279 Unspecified lack of coordination: Secondary | ICD-10-CM | POA: Diagnosis not present

## 2020-01-26 DIAGNOSIS — N390 Urinary tract infection, site not specified: Secondary | ICD-10-CM | POA: Diagnosis not present

## 2020-01-26 DIAGNOSIS — R262 Difficulty in walking, not elsewhere classified: Secondary | ICD-10-CM | POA: Diagnosis not present

## 2020-01-27 DIAGNOSIS — N390 Urinary tract infection, site not specified: Secondary | ICD-10-CM | POA: Diagnosis not present

## 2020-01-27 DIAGNOSIS — R279 Unspecified lack of coordination: Secondary | ICD-10-CM | POA: Diagnosis not present

## 2020-01-27 DIAGNOSIS — M6281 Muscle weakness (generalized): Secondary | ICD-10-CM | POA: Diagnosis not present

## 2020-01-27 DIAGNOSIS — R2681 Unsteadiness on feet: Secondary | ICD-10-CM | POA: Diagnosis not present

## 2020-01-27 DIAGNOSIS — R262 Difficulty in walking, not elsewhere classified: Secondary | ICD-10-CM | POA: Diagnosis not present

## 2020-01-27 DIAGNOSIS — I639 Cerebral infarction, unspecified: Secondary | ICD-10-CM | POA: Diagnosis not present

## 2020-01-27 DIAGNOSIS — R1312 Dysphagia, oropharyngeal phase: Secondary | ICD-10-CM | POA: Diagnosis not present

## 2020-01-27 DIAGNOSIS — R2689 Other abnormalities of gait and mobility: Secondary | ICD-10-CM | POA: Diagnosis not present

## 2020-01-28 ENCOUNTER — Other Ambulatory Visit: Payer: Self-pay

## 2020-01-28 ENCOUNTER — Ambulatory Visit (INDEPENDENT_AMBULATORY_CARE_PROVIDER_SITE_OTHER): Payer: Medicare Other

## 2020-01-28 DIAGNOSIS — Z0000 Encounter for general adult medical examination without abnormal findings: Secondary | ICD-10-CM

## 2020-01-28 DIAGNOSIS — R2681 Unsteadiness on feet: Secondary | ICD-10-CM | POA: Diagnosis not present

## 2020-01-28 DIAGNOSIS — R2689 Other abnormalities of gait and mobility: Secondary | ICD-10-CM | POA: Diagnosis not present

## 2020-01-28 DIAGNOSIS — M6281 Muscle weakness (generalized): Secondary | ICD-10-CM | POA: Diagnosis not present

## 2020-01-28 DIAGNOSIS — R279 Unspecified lack of coordination: Secondary | ICD-10-CM | POA: Diagnosis not present

## 2020-01-28 DIAGNOSIS — D649 Anemia, unspecified: Secondary | ICD-10-CM | POA: Diagnosis not present

## 2020-01-28 DIAGNOSIS — R1312 Dysphagia, oropharyngeal phase: Secondary | ICD-10-CM | POA: Diagnosis not present

## 2020-01-28 DIAGNOSIS — I639 Cerebral infarction, unspecified: Secondary | ICD-10-CM | POA: Diagnosis not present

## 2020-01-28 DIAGNOSIS — R262 Difficulty in walking, not elsewhere classified: Secondary | ICD-10-CM | POA: Diagnosis not present

## 2020-01-28 DIAGNOSIS — N390 Urinary tract infection, site not specified: Secondary | ICD-10-CM | POA: Diagnosis not present

## 2020-01-28 DIAGNOSIS — R1310 Dysphagia, unspecified: Secondary | ICD-10-CM | POA: Diagnosis not present

## 2020-01-28 NOTE — Patient Instructions (Signed)
Lori Berger , Thank you for taking time to come for your Medicare Wellness Visit. I appreciate your ongoing commitment to your health goals. Please review the following plan we discussed and let me know if I can assist you in the future.   Screening recommendations/referrals: Colonoscopy: No longer required.  Mammogram: No longer required.  Bone Density: Up to date, due 02/2023 Recommended yearly ophthalmology/optometry visit for glaucoma screening and checkup Recommended yearly dental visit for hygiene and checkup  Vaccinations: Influenza vaccine: Up to date Pneumococcal vaccine: Completed series Tdap vaccine: Up to date Shingles vaccine: Pt declines today.     Advanced directives: Please bring a copy of your POA (Power of Attorney) and/or Living Will to your next appointment.   Conditions/risks identified: Fall risk prevention and increasing water consumption discussed with daughter in law.  Next appointment: None, declined scheduling a follow up with PCP or an AWV for 2022. Pt is currently under the care of a physician at the SNF she resides at.    Preventive Care 11 Years and Older, Female Preventive care refers to lifestyle choices and visits with your health care provider that can promote health and wellness. What does preventive care include?  A yearly physical exam. This is also called an annual well check.  Dental exams once or twice a year.  Routine eye exams. Ask your health care provider how often you should have your eyes checked.  Personal lifestyle choices, including:  Daily care of your teeth and gums.  Regular physical activity.  Eating a healthy diet.  Avoiding tobacco and drug use.  Limiting alcohol use.  Practicing safe sex.  Taking low-dose aspirin every day.  Taking vitamin and mineral supplements as recommended by your health care provider. What happens during an annual well check? The services and screenings done by your health care provider  during your annual well check will depend on your age, overall health, lifestyle risk factors, and family history of disease. Counseling  Your health care provider may ask you questions about your:  Alcohol use.  Tobacco use.  Drug use.  Emotional well-being.  Home and relationship well-being.  Sexual activity.  Eating habits.  History of falls.  Memory and ability to understand (cognition).  Work and work Statistician.  Reproductive health. Screening  You may have the following tests or measurements:  Height, weight, and BMI.  Blood pressure.  Lipid and cholesterol levels. These may be checked every 5 years, or more frequently if you are over 87 years old.  Skin check.  Lung cancer screening. You may have this screening every year starting at age 88 if you have a 30-pack-year history of smoking and currently smoke or have quit within the past 15 years.  Fecal occult blood test (FOBT) of the stool. You may have this test every year starting at age 55.  Flexible sigmoidoscopy or colonoscopy. You may have a sigmoidoscopy every 5 years or a colonoscopy every 10 years starting at age 66.  Hepatitis C blood test.  Hepatitis B blood test.  Sexually transmitted disease (STD) testing.  Diabetes screening. This is done by checking your blood sugar (glucose) after you have not eaten for a while (fasting). You may have this done every 1-3 years.  Bone density scan. This is done to screen for osteoporosis. You may have this done starting at age 33.  Mammogram. This may be done every 1-2 years. Talk to your health care provider about how often you should have regular mammograms. Talk  with your health care provider about your test results, treatment options, and if necessary, the need for more tests. Vaccines  Your health care provider may recommend certain vaccines, such as:  Influenza vaccine. This is recommended every year.  Tetanus, diphtheria, and acellular pertussis  (Tdap, Td) vaccine. You may need a Td booster every 10 years.  Zoster vaccine. You may need this after age 20.  Pneumococcal 13-valent conjugate (PCV13) vaccine. One dose is recommended after age 5.  Pneumococcal polysaccharide (PPSV23) vaccine. One dose is recommended after age 22. Talk to your health care provider about which screenings and vaccines you need and how often you need them. This information is not intended to replace advice given to you by your health care provider. Make sure you discuss any questions you have with your health care provider. Document Released: 10/17/2015 Document Revised: 06/09/2016 Document Reviewed: 07/22/2015 Elsevier Interactive Patient Education  2017 [place] Prevention in the Home Falls can cause injuries. They can happen to people of all ages. There are many things you can do to make your home safe and to help prevent falls. What can I do on the outside of my home?  Regularly fix the edges of walkways and driveways and fix any cracks.  Remove anything that might make you trip as you walk through a door, such as a raised step or threshold.  Trim any bushes or trees on the path to your home.  Use bright outdoor lighting.  Clear any walking paths of anything that might make someone trip, such as rocks or tools.  Regularly check to see if handrails are loose or broken. Make sure that both sides of any steps have handrails.  Any raised decks and porches should have guardrails on the edges.  Have any leaves, snow, or ice cleared regularly.  Use sand or salt on walking paths during winter.  Clean up any spills in your garage right away. This includes oil or grease spills. What can I do in the bathroom?  Use night lights.  Install grab bars by the toilet and in the tub and shower. Do not use towel bars as grab bars.  Use non-skid mats or decals in the tub or shower.  If you need to sit down in the shower, use a plastic, non-slip  stool.  Keep the floor dry. Clean up any water that spills on the floor as soon as it happens.  Remove soap buildup in the tub or shower regularly.  Attach bath mats securely with double-sided non-slip rug tape.  Do not have throw rugs and other things on the floor that can make you trip. What can I do in the bedroom?  Use night lights.  Make sure that you have a light by your bed that is easy to reach.  Do not use any sheets or blankets that are too big for your bed. They should not hang down onto the floor.  Have a firm chair that has side arms. You can use this for support while you get dressed.  Do not have throw rugs and other things on the floor that can make you trip. What can I do in the kitchen?  Clean up any spills right away.  Avoid walking on wet floors.  Keep items that you use a lot in easy-to-reach places.  If you need to reach something above you, use a strong step stool that has a grab bar.  Keep electrical cords out of the way.  Do  not use floor polish or wax that makes floors slippery. If you must use wax, use non-skid floor wax.  Do not have throw rugs and other things on the floor that can make you trip. What can I do with my stairs?  Do not leave any items on the stairs.  Make sure that there are handrails on both sides of the stairs and use them. Fix handrails that are broken or loose. Make sure that handrails are as long as the stairways.  Check any carpeting to make sure that it is firmly attached to the stairs. Fix any carpet that is loose or worn.  Avoid having throw rugs at the top or bottom of the stairs. If you do have throw rugs, attach them to the floor with carpet tape.  Make sure that you have a light switch at the top of the stairs and the bottom of the stairs. If you do not have them, ask someone to add them for you. What else can I do to help prevent falls?  Wear shoes that:  Do not have high heels.  Have rubber bottoms.  Are  comfortable and fit you well.  Are closed at the toe. Do not wear sandals.  If you use a stepladder:  Make sure that it is fully opened. Do not climb a closed stepladder.  Make sure that both sides of the stepladder are locked into place.  Ask someone to hold it for you, if possible.  Clearly mark and make sure that you can see:  Any grab bars or handrails.  First and last steps.  Where the edge of each step is.  Use tools that help you move around (mobility aids) if they are needed. These include:  Canes.  Walkers.  Scooters.  Crutches.  Turn on the lights when you go into a dark area. Replace any light bulbs as soon as they burn out.  Set up your furniture so you have a clear path. Avoid moving your furniture around.  If any of your floors are uneven, fix them.  If there are any pets around you, be aware of where they are.  Review your medicines with your doctor. Some medicines can make you feel dizzy. This can increase your chance of falling. Ask your doctor what other things that you can do to help prevent falls. This information is not intended to replace advice given to you by your health care provider. Make sure you discuss any questions you have with your health care provider. Document Released: 07/17/2009 Document Revised: 02/26/2016 Document Reviewed: 10/25/2014 Elsevier Interactive Patient Education  2017 [employer].

## 2020-01-29 DIAGNOSIS — R279 Unspecified lack of coordination: Secondary | ICD-10-CM | POA: Diagnosis not present

## 2020-01-29 DIAGNOSIS — R262 Difficulty in walking, not elsewhere classified: Secondary | ICD-10-CM | POA: Diagnosis not present

## 2020-01-29 DIAGNOSIS — R1312 Dysphagia, oropharyngeal phase: Secondary | ICD-10-CM | POA: Diagnosis not present

## 2020-01-29 DIAGNOSIS — I639 Cerebral infarction, unspecified: Secondary | ICD-10-CM | POA: Diagnosis not present

## 2020-01-29 DIAGNOSIS — R2681 Unsteadiness on feet: Secondary | ICD-10-CM | POA: Diagnosis not present

## 2020-01-29 DIAGNOSIS — R2689 Other abnormalities of gait and mobility: Secondary | ICD-10-CM | POA: Diagnosis not present

## 2020-01-29 DIAGNOSIS — M6281 Muscle weakness (generalized): Secondary | ICD-10-CM | POA: Diagnosis not present

## 2020-01-29 DIAGNOSIS — N390 Urinary tract infection, site not specified: Secondary | ICD-10-CM | POA: Diagnosis not present

## 2020-01-30 DIAGNOSIS — N390 Urinary tract infection, site not specified: Secondary | ICD-10-CM | POA: Diagnosis not present

## 2020-01-30 DIAGNOSIS — E119 Type 2 diabetes mellitus without complications: Secondary | ICD-10-CM | POA: Diagnosis not present

## 2020-01-30 DIAGNOSIS — R2681 Unsteadiness on feet: Secondary | ICD-10-CM | POA: Diagnosis not present

## 2020-01-30 DIAGNOSIS — M6281 Muscle weakness (generalized): Secondary | ICD-10-CM | POA: Diagnosis not present

## 2020-01-30 DIAGNOSIS — R2689 Other abnormalities of gait and mobility: Secondary | ICD-10-CM | POA: Diagnosis not present

## 2020-01-30 DIAGNOSIS — R262 Difficulty in walking, not elsewhere classified: Secondary | ICD-10-CM | POA: Diagnosis not present

## 2020-01-30 DIAGNOSIS — I639 Cerebral infarction, unspecified: Secondary | ICD-10-CM | POA: Diagnosis not present

## 2020-01-30 DIAGNOSIS — R1312 Dysphagia, oropharyngeal phase: Secondary | ICD-10-CM | POA: Diagnosis not present

## 2020-01-30 DIAGNOSIS — R279 Unspecified lack of coordination: Secondary | ICD-10-CM | POA: Diagnosis not present

## 2020-01-30 DIAGNOSIS — E538 Deficiency of other specified B group vitamins: Secondary | ICD-10-CM | POA: Diagnosis not present

## 2020-01-31 DIAGNOSIS — I639 Cerebral infarction, unspecified: Secondary | ICD-10-CM | POA: Diagnosis not present

## 2020-01-31 DIAGNOSIS — N390 Urinary tract infection, site not specified: Secondary | ICD-10-CM | POA: Diagnosis not present

## 2020-01-31 DIAGNOSIS — R2681 Unsteadiness on feet: Secondary | ICD-10-CM | POA: Diagnosis not present

## 2020-01-31 DIAGNOSIS — B3749 Other urogenital candidiasis: Secondary | ICD-10-CM | POA: Diagnosis not present

## 2020-01-31 DIAGNOSIS — D649 Anemia, unspecified: Secondary | ICD-10-CM | POA: Diagnosis not present

## 2020-01-31 DIAGNOSIS — R2689 Other abnormalities of gait and mobility: Secondary | ICD-10-CM | POA: Diagnosis not present

## 2020-01-31 DIAGNOSIS — M6281 Muscle weakness (generalized): Secondary | ICD-10-CM | POA: Diagnosis not present

## 2020-01-31 DIAGNOSIS — E538 Deficiency of other specified B group vitamins: Secondary | ICD-10-CM | POA: Diagnosis not present

## 2020-01-31 DIAGNOSIS — R262 Difficulty in walking, not elsewhere classified: Secondary | ICD-10-CM | POA: Diagnosis not present

## 2020-01-31 DIAGNOSIS — R279 Unspecified lack of coordination: Secondary | ICD-10-CM | POA: Diagnosis not present

## 2020-01-31 DIAGNOSIS — R1312 Dysphagia, oropharyngeal phase: Secondary | ICD-10-CM | POA: Diagnosis not present

## 2020-02-01 DIAGNOSIS — I639 Cerebral infarction, unspecified: Secondary | ICD-10-CM | POA: Diagnosis not present

## 2020-02-01 DIAGNOSIS — R262 Difficulty in walking, not elsewhere classified: Secondary | ICD-10-CM | POA: Diagnosis not present

## 2020-02-01 DIAGNOSIS — R2681 Unsteadiness on feet: Secondary | ICD-10-CM | POA: Diagnosis not present

## 2020-02-01 DIAGNOSIS — M6281 Muscle weakness (generalized): Secondary | ICD-10-CM | POA: Diagnosis not present

## 2020-02-01 DIAGNOSIS — R1312 Dysphagia, oropharyngeal phase: Secondary | ICD-10-CM | POA: Diagnosis not present

## 2020-02-01 DIAGNOSIS — N390 Urinary tract infection, site not specified: Secondary | ICD-10-CM | POA: Diagnosis not present

## 2020-02-01 DIAGNOSIS — R2689 Other abnormalities of gait and mobility: Secondary | ICD-10-CM | POA: Diagnosis not present

## 2020-02-01 DIAGNOSIS — R279 Unspecified lack of coordination: Secondary | ICD-10-CM | POA: Diagnosis not present

## 2020-02-02 DIAGNOSIS — R1312 Dysphagia, oropharyngeal phase: Secondary | ICD-10-CM | POA: Diagnosis not present

## 2020-02-02 DIAGNOSIS — R279 Unspecified lack of coordination: Secondary | ICD-10-CM | POA: Diagnosis not present

## 2020-02-02 DIAGNOSIS — N390 Urinary tract infection, site not specified: Secondary | ICD-10-CM | POA: Diagnosis not present

## 2020-02-02 DIAGNOSIS — R262 Difficulty in walking, not elsewhere classified: Secondary | ICD-10-CM | POA: Diagnosis not present

## 2020-02-02 DIAGNOSIS — R2689 Other abnormalities of gait and mobility: Secondary | ICD-10-CM | POA: Diagnosis not present

## 2020-02-02 DIAGNOSIS — M6281 Muscle weakness (generalized): Secondary | ICD-10-CM | POA: Diagnosis not present

## 2020-02-02 DIAGNOSIS — R2681 Unsteadiness on feet: Secondary | ICD-10-CM | POA: Diagnosis not present

## 2020-02-02 DIAGNOSIS — I639 Cerebral infarction, unspecified: Secondary | ICD-10-CM | POA: Diagnosis not present

## 2020-02-04 DIAGNOSIS — R2681 Unsteadiness on feet: Secondary | ICD-10-CM | POA: Diagnosis not present

## 2020-02-04 DIAGNOSIS — M6281 Muscle weakness (generalized): Secondary | ICD-10-CM | POA: Diagnosis not present

## 2020-02-04 DIAGNOSIS — R262 Difficulty in walking, not elsewhere classified: Secondary | ICD-10-CM | POA: Diagnosis not present

## 2020-02-04 DIAGNOSIS — N390 Urinary tract infection, site not specified: Secondary | ICD-10-CM | POA: Diagnosis not present

## 2020-02-04 DIAGNOSIS — I639 Cerebral infarction, unspecified: Secondary | ICD-10-CM | POA: Diagnosis not present

## 2020-02-04 DIAGNOSIS — R2689 Other abnormalities of gait and mobility: Secondary | ICD-10-CM | POA: Diagnosis not present

## 2020-02-04 DIAGNOSIS — R1312 Dysphagia, oropharyngeal phase: Secondary | ICD-10-CM | POA: Diagnosis not present

## 2020-02-04 DIAGNOSIS — R279 Unspecified lack of coordination: Secondary | ICD-10-CM | POA: Diagnosis not present

## 2020-02-05 DIAGNOSIS — R2681 Unsteadiness on feet: Secondary | ICD-10-CM | POA: Diagnosis not present

## 2020-02-05 DIAGNOSIS — R1312 Dysphagia, oropharyngeal phase: Secondary | ICD-10-CM | POA: Diagnosis not present

## 2020-02-05 DIAGNOSIS — M6281 Muscle weakness (generalized): Secondary | ICD-10-CM | POA: Diagnosis not present

## 2020-02-05 DIAGNOSIS — I639 Cerebral infarction, unspecified: Secondary | ICD-10-CM | POA: Diagnosis not present

## 2020-02-05 DIAGNOSIS — R262 Difficulty in walking, not elsewhere classified: Secondary | ICD-10-CM | POA: Diagnosis not present

## 2020-02-05 DIAGNOSIS — R2689 Other abnormalities of gait and mobility: Secondary | ICD-10-CM | POA: Diagnosis not present

## 2020-02-05 DIAGNOSIS — N390 Urinary tract infection, site not specified: Secondary | ICD-10-CM | POA: Diagnosis not present

## 2020-02-05 DIAGNOSIS — R279 Unspecified lack of coordination: Secondary | ICD-10-CM | POA: Diagnosis not present

## 2020-02-06 DIAGNOSIS — R2689 Other abnormalities of gait and mobility: Secondary | ICD-10-CM | POA: Diagnosis not present

## 2020-02-06 DIAGNOSIS — I639 Cerebral infarction, unspecified: Secondary | ICD-10-CM | POA: Diagnosis not present

## 2020-02-06 DIAGNOSIS — R2681 Unsteadiness on feet: Secondary | ICD-10-CM | POA: Diagnosis not present

## 2020-02-06 DIAGNOSIS — R279 Unspecified lack of coordination: Secondary | ICD-10-CM | POA: Diagnosis not present

## 2020-02-06 DIAGNOSIS — R1312 Dysphagia, oropharyngeal phase: Secondary | ICD-10-CM | POA: Diagnosis not present

## 2020-02-06 DIAGNOSIS — R262 Difficulty in walking, not elsewhere classified: Secondary | ICD-10-CM | POA: Diagnosis not present

## 2020-02-06 DIAGNOSIS — M6281 Muscle weakness (generalized): Secondary | ICD-10-CM | POA: Diagnosis not present

## 2020-02-06 DIAGNOSIS — N390 Urinary tract infection, site not specified: Secondary | ICD-10-CM | POA: Diagnosis not present

## 2020-02-07 DIAGNOSIS — R262 Difficulty in walking, not elsewhere classified: Secondary | ICD-10-CM | POA: Diagnosis not present

## 2020-02-07 DIAGNOSIS — M6281 Muscle weakness (generalized): Secondary | ICD-10-CM | POA: Diagnosis not present

## 2020-02-07 DIAGNOSIS — I639 Cerebral infarction, unspecified: Secondary | ICD-10-CM | POA: Diagnosis not present

## 2020-02-07 DIAGNOSIS — N390 Urinary tract infection, site not specified: Secondary | ICD-10-CM | POA: Diagnosis not present

## 2020-02-07 DIAGNOSIS — R2681 Unsteadiness on feet: Secondary | ICD-10-CM | POA: Diagnosis not present

## 2020-02-07 DIAGNOSIS — R2689 Other abnormalities of gait and mobility: Secondary | ICD-10-CM | POA: Diagnosis not present

## 2020-02-07 DIAGNOSIS — R1312 Dysphagia, oropharyngeal phase: Secondary | ICD-10-CM | POA: Diagnosis not present

## 2020-02-07 DIAGNOSIS — R279 Unspecified lack of coordination: Secondary | ICD-10-CM | POA: Diagnosis not present

## 2020-02-08 DIAGNOSIS — I639 Cerebral infarction, unspecified: Secondary | ICD-10-CM | POA: Diagnosis not present

## 2020-02-08 DIAGNOSIS — R1312 Dysphagia, oropharyngeal phase: Secondary | ICD-10-CM | POA: Diagnosis not present

## 2020-02-08 DIAGNOSIS — R2681 Unsteadiness on feet: Secondary | ICD-10-CM | POA: Diagnosis not present

## 2020-02-08 DIAGNOSIS — R2689 Other abnormalities of gait and mobility: Secondary | ICD-10-CM | POA: Diagnosis not present

## 2020-02-08 DIAGNOSIS — R279 Unspecified lack of coordination: Secondary | ICD-10-CM | POA: Diagnosis not present

## 2020-02-08 DIAGNOSIS — R262 Difficulty in walking, not elsewhere classified: Secondary | ICD-10-CM | POA: Diagnosis not present

## 2020-02-08 DIAGNOSIS — N390 Urinary tract infection, site not specified: Secondary | ICD-10-CM | POA: Diagnosis not present

## 2020-02-08 DIAGNOSIS — M6281 Muscle weakness (generalized): Secondary | ICD-10-CM | POA: Diagnosis not present

## 2020-02-09 DIAGNOSIS — R2689 Other abnormalities of gait and mobility: Secondary | ICD-10-CM | POA: Diagnosis not present

## 2020-02-09 DIAGNOSIS — N390 Urinary tract infection, site not specified: Secondary | ICD-10-CM | POA: Diagnosis not present

## 2020-02-09 DIAGNOSIS — R279 Unspecified lack of coordination: Secondary | ICD-10-CM | POA: Diagnosis not present

## 2020-02-09 DIAGNOSIS — I639 Cerebral infarction, unspecified: Secondary | ICD-10-CM | POA: Diagnosis not present

## 2020-02-09 DIAGNOSIS — M6281 Muscle weakness (generalized): Secondary | ICD-10-CM | POA: Diagnosis not present

## 2020-02-09 DIAGNOSIS — R2681 Unsteadiness on feet: Secondary | ICD-10-CM | POA: Diagnosis not present

## 2020-02-09 DIAGNOSIS — R262 Difficulty in walking, not elsewhere classified: Secondary | ICD-10-CM | POA: Diagnosis not present

## 2020-02-09 DIAGNOSIS — R1312 Dysphagia, oropharyngeal phase: Secondary | ICD-10-CM | POA: Diagnosis not present

## 2020-02-10 DIAGNOSIS — R1312 Dysphagia, oropharyngeal phase: Secondary | ICD-10-CM | POA: Diagnosis not present

## 2020-02-10 DIAGNOSIS — N390 Urinary tract infection, site not specified: Secondary | ICD-10-CM | POA: Diagnosis not present

## 2020-02-10 DIAGNOSIS — R262 Difficulty in walking, not elsewhere classified: Secondary | ICD-10-CM | POA: Diagnosis not present

## 2020-02-10 DIAGNOSIS — M6281 Muscle weakness (generalized): Secondary | ICD-10-CM | POA: Diagnosis not present

## 2020-02-10 DIAGNOSIS — R2681 Unsteadiness on feet: Secondary | ICD-10-CM | POA: Diagnosis not present

## 2020-02-10 DIAGNOSIS — R279 Unspecified lack of coordination: Secondary | ICD-10-CM | POA: Diagnosis not present

## 2020-02-10 DIAGNOSIS — R2689 Other abnormalities of gait and mobility: Secondary | ICD-10-CM | POA: Diagnosis not present

## 2020-02-10 DIAGNOSIS — I639 Cerebral infarction, unspecified: Secondary | ICD-10-CM | POA: Diagnosis not present

## 2020-02-11 DIAGNOSIS — R1111 Vomiting without nausea: Secondary | ICD-10-CM | POA: Diagnosis not present

## 2020-02-11 DIAGNOSIS — R2681 Unsteadiness on feet: Secondary | ICD-10-CM | POA: Diagnosis not present

## 2020-02-11 DIAGNOSIS — R531 Weakness: Secondary | ICD-10-CM | POA: Diagnosis not present

## 2020-02-11 DIAGNOSIS — N390 Urinary tract infection, site not specified: Secondary | ICD-10-CM | POA: Diagnosis not present

## 2020-02-11 DIAGNOSIS — R262 Difficulty in walking, not elsewhere classified: Secondary | ICD-10-CM | POA: Diagnosis not present

## 2020-02-11 DIAGNOSIS — R1312 Dysphagia, oropharyngeal phase: Secondary | ICD-10-CM | POA: Diagnosis not present

## 2020-02-11 DIAGNOSIS — I639 Cerebral infarction, unspecified: Secondary | ICD-10-CM | POA: Diagnosis not present

## 2020-02-11 DIAGNOSIS — M6281 Muscle weakness (generalized): Secondary | ICD-10-CM | POA: Diagnosis not present

## 2020-02-11 DIAGNOSIS — R279 Unspecified lack of coordination: Secondary | ICD-10-CM | POA: Diagnosis not present

## 2020-02-11 DIAGNOSIS — U071 COVID-19: Secondary | ICD-10-CM | POA: Diagnosis not present

## 2020-02-11 DIAGNOSIS — I10 Essential (primary) hypertension: Secondary | ICD-10-CM | POA: Diagnosis not present

## 2020-02-11 DIAGNOSIS — R2689 Other abnormalities of gait and mobility: Secondary | ICD-10-CM | POA: Diagnosis not present

## 2020-02-12 DIAGNOSIS — R1312 Dysphagia, oropharyngeal phase: Secondary | ICD-10-CM | POA: Diagnosis not present

## 2020-02-12 DIAGNOSIS — R279 Unspecified lack of coordination: Secondary | ICD-10-CM | POA: Diagnosis not present

## 2020-02-12 DIAGNOSIS — M6281 Muscle weakness (generalized): Secondary | ICD-10-CM | POA: Diagnosis not present

## 2020-02-12 DIAGNOSIS — R2689 Other abnormalities of gait and mobility: Secondary | ICD-10-CM | POA: Diagnosis not present

## 2020-02-12 DIAGNOSIS — I639 Cerebral infarction, unspecified: Secondary | ICD-10-CM | POA: Diagnosis not present

## 2020-02-12 DIAGNOSIS — R2681 Unsteadiness on feet: Secondary | ICD-10-CM | POA: Diagnosis not present

## 2020-02-12 DIAGNOSIS — R262 Difficulty in walking, not elsewhere classified: Secondary | ICD-10-CM | POA: Diagnosis not present

## 2020-02-12 DIAGNOSIS — N390 Urinary tract infection, site not specified: Secondary | ICD-10-CM | POA: Diagnosis not present

## 2020-02-13 DIAGNOSIS — E119 Type 2 diabetes mellitus without complications: Secondary | ICD-10-CM | POA: Diagnosis not present

## 2020-02-13 DIAGNOSIS — I639 Cerebral infarction, unspecified: Secondary | ICD-10-CM | POA: Diagnosis not present

## 2020-02-13 DIAGNOSIS — R262 Difficulty in walking, not elsewhere classified: Secondary | ICD-10-CM | POA: Diagnosis not present

## 2020-02-13 DIAGNOSIS — R2689 Other abnormalities of gait and mobility: Secondary | ICD-10-CM | POA: Diagnosis not present

## 2020-02-13 DIAGNOSIS — M6281 Muscle weakness (generalized): Secondary | ICD-10-CM | POA: Diagnosis not present

## 2020-02-13 DIAGNOSIS — R1111 Vomiting without nausea: Secondary | ICD-10-CM | POA: Diagnosis not present

## 2020-02-13 DIAGNOSIS — E785 Hyperlipidemia, unspecified: Secondary | ICD-10-CM | POA: Diagnosis not present

## 2020-02-13 DIAGNOSIS — R2681 Unsteadiness on feet: Secondary | ICD-10-CM | POA: Diagnosis not present

## 2020-02-13 DIAGNOSIS — N390 Urinary tract infection, site not specified: Secondary | ICD-10-CM | POA: Diagnosis not present

## 2020-02-13 DIAGNOSIS — R1312 Dysphagia, oropharyngeal phase: Secondary | ICD-10-CM | POA: Diagnosis not present

## 2020-02-13 DIAGNOSIS — R279 Unspecified lack of coordination: Secondary | ICD-10-CM | POA: Diagnosis not present

## 2020-02-14 DIAGNOSIS — R2689 Other abnormalities of gait and mobility: Secondary | ICD-10-CM | POA: Diagnosis not present

## 2020-02-14 DIAGNOSIS — R279 Unspecified lack of coordination: Secondary | ICD-10-CM | POA: Diagnosis not present

## 2020-02-14 DIAGNOSIS — R262 Difficulty in walking, not elsewhere classified: Secondary | ICD-10-CM | POA: Diagnosis not present

## 2020-02-14 DIAGNOSIS — R2681 Unsteadiness on feet: Secondary | ICD-10-CM | POA: Diagnosis not present

## 2020-02-14 DIAGNOSIS — N390 Urinary tract infection, site not specified: Secondary | ICD-10-CM | POA: Diagnosis not present

## 2020-02-14 DIAGNOSIS — I639 Cerebral infarction, unspecified: Secondary | ICD-10-CM | POA: Diagnosis not present

## 2020-02-14 DIAGNOSIS — M6281 Muscle weakness (generalized): Secondary | ICD-10-CM | POA: Diagnosis not present

## 2020-02-14 DIAGNOSIS — R1312 Dysphagia, oropharyngeal phase: Secondary | ICD-10-CM | POA: Diagnosis not present

## 2020-02-15 DIAGNOSIS — R2681 Unsteadiness on feet: Secondary | ICD-10-CM | POA: Diagnosis not present

## 2020-02-15 DIAGNOSIS — I639 Cerebral infarction, unspecified: Secondary | ICD-10-CM | POA: Diagnosis not present

## 2020-02-15 DIAGNOSIS — N390 Urinary tract infection, site not specified: Secondary | ICD-10-CM | POA: Diagnosis not present

## 2020-02-15 DIAGNOSIS — R1312 Dysphagia, oropharyngeal phase: Secondary | ICD-10-CM | POA: Diagnosis not present

## 2020-02-15 DIAGNOSIS — M6281 Muscle weakness (generalized): Secondary | ICD-10-CM | POA: Diagnosis not present

## 2020-02-15 DIAGNOSIS — R262 Difficulty in walking, not elsewhere classified: Secondary | ICD-10-CM | POA: Diagnosis not present

## 2020-02-15 DIAGNOSIS — R2689 Other abnormalities of gait and mobility: Secondary | ICD-10-CM | POA: Diagnosis not present

## 2020-02-15 DIAGNOSIS — R279 Unspecified lack of coordination: Secondary | ICD-10-CM | POA: Diagnosis not present

## 2020-02-16 DIAGNOSIS — R2681 Unsteadiness on feet: Secondary | ICD-10-CM | POA: Diagnosis not present

## 2020-02-16 DIAGNOSIS — I639 Cerebral infarction, unspecified: Secondary | ICD-10-CM | POA: Diagnosis not present

## 2020-02-16 DIAGNOSIS — R1312 Dysphagia, oropharyngeal phase: Secondary | ICD-10-CM | POA: Diagnosis not present

## 2020-02-16 DIAGNOSIS — R262 Difficulty in walking, not elsewhere classified: Secondary | ICD-10-CM | POA: Diagnosis not present

## 2020-02-16 DIAGNOSIS — R2689 Other abnormalities of gait and mobility: Secondary | ICD-10-CM | POA: Diagnosis not present

## 2020-02-16 DIAGNOSIS — R279 Unspecified lack of coordination: Secondary | ICD-10-CM | POA: Diagnosis not present

## 2020-02-16 DIAGNOSIS — N390 Urinary tract infection, site not specified: Secondary | ICD-10-CM | POA: Diagnosis not present

## 2020-02-16 DIAGNOSIS — M6281 Muscle weakness (generalized): Secondary | ICD-10-CM | POA: Diagnosis not present

## 2020-02-18 DIAGNOSIS — R2689 Other abnormalities of gait and mobility: Secondary | ICD-10-CM | POA: Diagnosis not present

## 2020-02-18 DIAGNOSIS — R262 Difficulty in walking, not elsewhere classified: Secondary | ICD-10-CM | POA: Diagnosis not present

## 2020-02-18 DIAGNOSIS — R279 Unspecified lack of coordination: Secondary | ICD-10-CM | POA: Diagnosis not present

## 2020-02-18 DIAGNOSIS — M6281 Muscle weakness (generalized): Secondary | ICD-10-CM | POA: Diagnosis not present

## 2020-02-18 DIAGNOSIS — N390 Urinary tract infection, site not specified: Secondary | ICD-10-CM | POA: Diagnosis not present

## 2020-02-18 DIAGNOSIS — I639 Cerebral infarction, unspecified: Secondary | ICD-10-CM | POA: Diagnosis not present

## 2020-02-18 DIAGNOSIS — R1312 Dysphagia, oropharyngeal phase: Secondary | ICD-10-CM | POA: Diagnosis not present

## 2020-02-18 DIAGNOSIS — R2681 Unsteadiness on feet: Secondary | ICD-10-CM | POA: Diagnosis not present

## 2020-02-19 DIAGNOSIS — R2681 Unsteadiness on feet: Secondary | ICD-10-CM | POA: Diagnosis not present

## 2020-02-19 DIAGNOSIS — R2689 Other abnormalities of gait and mobility: Secondary | ICD-10-CM | POA: Diagnosis not present

## 2020-02-19 DIAGNOSIS — I639 Cerebral infarction, unspecified: Secondary | ICD-10-CM | POA: Diagnosis not present

## 2020-02-19 DIAGNOSIS — N390 Urinary tract infection, site not specified: Secondary | ICD-10-CM | POA: Diagnosis not present

## 2020-02-19 DIAGNOSIS — R1312 Dysphagia, oropharyngeal phase: Secondary | ICD-10-CM | POA: Diagnosis not present

## 2020-02-19 DIAGNOSIS — R279 Unspecified lack of coordination: Secondary | ICD-10-CM | POA: Diagnosis not present

## 2020-02-19 DIAGNOSIS — R262 Difficulty in walking, not elsewhere classified: Secondary | ICD-10-CM | POA: Diagnosis not present

## 2020-02-19 DIAGNOSIS — M6281 Muscle weakness (generalized): Secondary | ICD-10-CM | POA: Diagnosis not present

## 2020-02-20 DIAGNOSIS — R279 Unspecified lack of coordination: Secondary | ICD-10-CM | POA: Diagnosis not present

## 2020-02-20 DIAGNOSIS — R262 Difficulty in walking, not elsewhere classified: Secondary | ICD-10-CM | POA: Diagnosis not present

## 2020-02-20 DIAGNOSIS — U071 COVID-19: Secondary | ICD-10-CM | POA: Diagnosis not present

## 2020-02-20 DIAGNOSIS — R2681 Unsteadiness on feet: Secondary | ICD-10-CM | POA: Diagnosis not present

## 2020-02-20 DIAGNOSIS — R1312 Dysphagia, oropharyngeal phase: Secondary | ICD-10-CM | POA: Diagnosis not present

## 2020-02-20 DIAGNOSIS — M6281 Muscle weakness (generalized): Secondary | ICD-10-CM | POA: Diagnosis not present

## 2020-02-20 DIAGNOSIS — I639 Cerebral infarction, unspecified: Secondary | ICD-10-CM | POA: Diagnosis not present

## 2020-02-20 DIAGNOSIS — R2689 Other abnormalities of gait and mobility: Secondary | ICD-10-CM | POA: Diagnosis not present

## 2020-02-20 DIAGNOSIS — N390 Urinary tract infection, site not specified: Secondary | ICD-10-CM | POA: Diagnosis not present

## 2020-02-21 DIAGNOSIS — R279 Unspecified lack of coordination: Secondary | ICD-10-CM | POA: Diagnosis not present

## 2020-02-21 DIAGNOSIS — N390 Urinary tract infection, site not specified: Secondary | ICD-10-CM | POA: Diagnosis not present

## 2020-02-21 DIAGNOSIS — R2681 Unsteadiness on feet: Secondary | ICD-10-CM | POA: Diagnosis not present

## 2020-02-21 DIAGNOSIS — M6281 Muscle weakness (generalized): Secondary | ICD-10-CM | POA: Diagnosis not present

## 2020-02-21 DIAGNOSIS — R1312 Dysphagia, oropharyngeal phase: Secondary | ICD-10-CM | POA: Diagnosis not present

## 2020-02-21 DIAGNOSIS — R2689 Other abnormalities of gait and mobility: Secondary | ICD-10-CM | POA: Diagnosis not present

## 2020-02-21 DIAGNOSIS — I639 Cerebral infarction, unspecified: Secondary | ICD-10-CM | POA: Diagnosis not present

## 2020-02-21 DIAGNOSIS — R262 Difficulty in walking, not elsewhere classified: Secondary | ICD-10-CM | POA: Diagnosis not present

## 2020-02-22 DIAGNOSIS — R262 Difficulty in walking, not elsewhere classified: Secondary | ICD-10-CM | POA: Diagnosis not present

## 2020-02-22 DIAGNOSIS — R279 Unspecified lack of coordination: Secondary | ICD-10-CM | POA: Diagnosis not present

## 2020-02-22 DIAGNOSIS — R2681 Unsteadiness on feet: Secondary | ICD-10-CM | POA: Diagnosis not present

## 2020-02-22 DIAGNOSIS — R1312 Dysphagia, oropharyngeal phase: Secondary | ICD-10-CM | POA: Diagnosis not present

## 2020-02-22 DIAGNOSIS — R2689 Other abnormalities of gait and mobility: Secondary | ICD-10-CM | POA: Diagnosis not present

## 2020-02-22 DIAGNOSIS — I639 Cerebral infarction, unspecified: Secondary | ICD-10-CM | POA: Diagnosis not present

## 2020-02-22 DIAGNOSIS — M6281 Muscle weakness (generalized): Secondary | ICD-10-CM | POA: Diagnosis not present

## 2020-02-22 DIAGNOSIS — N390 Urinary tract infection, site not specified: Secondary | ICD-10-CM | POA: Diagnosis not present

## 2020-02-23 DIAGNOSIS — R1312 Dysphagia, oropharyngeal phase: Secondary | ICD-10-CM | POA: Diagnosis not present

## 2020-02-23 DIAGNOSIS — N390 Urinary tract infection, site not specified: Secondary | ICD-10-CM | POA: Diagnosis not present

## 2020-02-23 DIAGNOSIS — R279 Unspecified lack of coordination: Secondary | ICD-10-CM | POA: Diagnosis not present

## 2020-02-23 DIAGNOSIS — R262 Difficulty in walking, not elsewhere classified: Secondary | ICD-10-CM | POA: Diagnosis not present

## 2020-02-23 DIAGNOSIS — R2689 Other abnormalities of gait and mobility: Secondary | ICD-10-CM | POA: Diagnosis not present

## 2020-02-23 DIAGNOSIS — M6281 Muscle weakness (generalized): Secondary | ICD-10-CM | POA: Diagnosis not present

## 2020-02-23 DIAGNOSIS — I639 Cerebral infarction, unspecified: Secondary | ICD-10-CM | POA: Diagnosis not present

## 2020-02-23 DIAGNOSIS — R2681 Unsteadiness on feet: Secondary | ICD-10-CM | POA: Diagnosis not present

## 2020-02-24 DIAGNOSIS — R1312 Dysphagia, oropharyngeal phase: Secondary | ICD-10-CM | POA: Diagnosis not present

## 2020-02-24 DIAGNOSIS — I639 Cerebral infarction, unspecified: Secondary | ICD-10-CM | POA: Diagnosis not present

## 2020-02-24 DIAGNOSIS — R2681 Unsteadiness on feet: Secondary | ICD-10-CM | POA: Diagnosis not present

## 2020-02-24 DIAGNOSIS — R262 Difficulty in walking, not elsewhere classified: Secondary | ICD-10-CM | POA: Diagnosis not present

## 2020-02-24 DIAGNOSIS — R279 Unspecified lack of coordination: Secondary | ICD-10-CM | POA: Diagnosis not present

## 2020-02-24 DIAGNOSIS — N390 Urinary tract infection, site not specified: Secondary | ICD-10-CM | POA: Diagnosis not present

## 2020-02-24 DIAGNOSIS — R2689 Other abnormalities of gait and mobility: Secondary | ICD-10-CM | POA: Diagnosis not present

## 2020-02-24 DIAGNOSIS — M6281 Muscle weakness (generalized): Secondary | ICD-10-CM | POA: Diagnosis not present

## 2020-02-25 DIAGNOSIS — M6281 Muscle weakness (generalized): Secondary | ICD-10-CM | POA: Diagnosis not present

## 2020-02-25 DIAGNOSIS — R2681 Unsteadiness on feet: Secondary | ICD-10-CM | POA: Diagnosis not present

## 2020-02-25 DIAGNOSIS — N390 Urinary tract infection, site not specified: Secondary | ICD-10-CM | POA: Diagnosis not present

## 2020-02-25 DIAGNOSIS — I639 Cerebral infarction, unspecified: Secondary | ICD-10-CM | POA: Diagnosis not present

## 2020-02-25 DIAGNOSIS — R262 Difficulty in walking, not elsewhere classified: Secondary | ICD-10-CM | POA: Diagnosis not present

## 2020-02-25 DIAGNOSIS — R279 Unspecified lack of coordination: Secondary | ICD-10-CM | POA: Diagnosis not present

## 2020-02-25 DIAGNOSIS — R1312 Dysphagia, oropharyngeal phase: Secondary | ICD-10-CM | POA: Diagnosis not present

## 2020-02-25 DIAGNOSIS — R2689 Other abnormalities of gait and mobility: Secondary | ICD-10-CM | POA: Diagnosis not present

## 2020-02-26 DIAGNOSIS — S99921A Unspecified injury of right foot, initial encounter: Secondary | ICD-10-CM | POA: Diagnosis not present

## 2020-02-26 DIAGNOSIS — S91209A Unspecified open wound of unspecified toe(s) with damage to nail, initial encounter: Secondary | ICD-10-CM | POA: Diagnosis not present

## 2020-02-27 DIAGNOSIS — M6281 Muscle weakness (generalized): Secondary | ICD-10-CM | POA: Diagnosis not present

## 2020-02-27 DIAGNOSIS — R2681 Unsteadiness on feet: Secondary | ICD-10-CM | POA: Diagnosis not present

## 2020-02-27 DIAGNOSIS — N390 Urinary tract infection, site not specified: Secondary | ICD-10-CM | POA: Diagnosis not present

## 2020-02-27 DIAGNOSIS — R2689 Other abnormalities of gait and mobility: Secondary | ICD-10-CM | POA: Diagnosis not present

## 2020-02-27 DIAGNOSIS — R279 Unspecified lack of coordination: Secondary | ICD-10-CM | POA: Diagnosis not present

## 2020-02-27 DIAGNOSIS — R262 Difficulty in walking, not elsewhere classified: Secondary | ICD-10-CM | POA: Diagnosis not present

## 2020-02-27 DIAGNOSIS — R1312 Dysphagia, oropharyngeal phase: Secondary | ICD-10-CM | POA: Diagnosis not present

## 2020-02-27 DIAGNOSIS — I639 Cerebral infarction, unspecified: Secondary | ICD-10-CM | POA: Diagnosis not present

## 2020-02-28 DIAGNOSIS — R2689 Other abnormalities of gait and mobility: Secondary | ICD-10-CM | POA: Diagnosis not present

## 2020-02-28 DIAGNOSIS — R2681 Unsteadiness on feet: Secondary | ICD-10-CM | POA: Diagnosis not present

## 2020-02-28 DIAGNOSIS — R262 Difficulty in walking, not elsewhere classified: Secondary | ICD-10-CM | POA: Diagnosis not present

## 2020-02-28 DIAGNOSIS — I639 Cerebral infarction, unspecified: Secondary | ICD-10-CM | POA: Diagnosis not present

## 2020-02-28 DIAGNOSIS — N390 Urinary tract infection, site not specified: Secondary | ICD-10-CM | POA: Diagnosis not present

## 2020-02-28 DIAGNOSIS — R279 Unspecified lack of coordination: Secondary | ICD-10-CM | POA: Diagnosis not present

## 2020-02-28 DIAGNOSIS — M6281 Muscle weakness (generalized): Secondary | ICD-10-CM | POA: Diagnosis not present

## 2020-02-28 DIAGNOSIS — R1312 Dysphagia, oropharyngeal phase: Secondary | ICD-10-CM | POA: Diagnosis not present

## 2020-02-29 DIAGNOSIS — R279 Unspecified lack of coordination: Secondary | ICD-10-CM | POA: Diagnosis not present

## 2020-02-29 DIAGNOSIS — R1312 Dysphagia, oropharyngeal phase: Secondary | ICD-10-CM | POA: Diagnosis not present

## 2020-02-29 DIAGNOSIS — R2681 Unsteadiness on feet: Secondary | ICD-10-CM | POA: Diagnosis not present

## 2020-02-29 DIAGNOSIS — S91209A Unspecified open wound of unspecified toe(s) with damage to nail, initial encounter: Secondary | ICD-10-CM | POA: Diagnosis not present

## 2020-02-29 DIAGNOSIS — R2689 Other abnormalities of gait and mobility: Secondary | ICD-10-CM | POA: Diagnosis not present

## 2020-02-29 DIAGNOSIS — M6281 Muscle weakness (generalized): Secondary | ICD-10-CM | POA: Diagnosis not present

## 2020-02-29 DIAGNOSIS — S99921A Unspecified injury of right foot, initial encounter: Secondary | ICD-10-CM | POA: Diagnosis not present

## 2020-02-29 DIAGNOSIS — E119 Type 2 diabetes mellitus without complications: Secondary | ICD-10-CM | POA: Diagnosis not present

## 2020-02-29 DIAGNOSIS — I639 Cerebral infarction, unspecified: Secondary | ICD-10-CM | POA: Diagnosis not present

## 2020-02-29 DIAGNOSIS — R262 Difficulty in walking, not elsewhere classified: Secondary | ICD-10-CM | POA: Diagnosis not present

## 2020-02-29 DIAGNOSIS — N390 Urinary tract infection, site not specified: Secondary | ICD-10-CM | POA: Diagnosis not present

## 2020-03-01 DIAGNOSIS — M6281 Muscle weakness (generalized): Secondary | ICD-10-CM | POA: Diagnosis not present

## 2020-03-01 DIAGNOSIS — R279 Unspecified lack of coordination: Secondary | ICD-10-CM | POA: Diagnosis not present

## 2020-03-01 DIAGNOSIS — I639 Cerebral infarction, unspecified: Secondary | ICD-10-CM | POA: Diagnosis not present

## 2020-03-01 DIAGNOSIS — R262 Difficulty in walking, not elsewhere classified: Secondary | ICD-10-CM | POA: Diagnosis not present

## 2020-03-01 DIAGNOSIS — N390 Urinary tract infection, site not specified: Secondary | ICD-10-CM | POA: Diagnosis not present

## 2020-03-01 DIAGNOSIS — R2689 Other abnormalities of gait and mobility: Secondary | ICD-10-CM | POA: Diagnosis not present

## 2020-03-01 DIAGNOSIS — R1312 Dysphagia, oropharyngeal phase: Secondary | ICD-10-CM | POA: Diagnosis not present

## 2020-03-01 DIAGNOSIS — R2681 Unsteadiness on feet: Secondary | ICD-10-CM | POA: Diagnosis not present

## 2020-03-02 DIAGNOSIS — N390 Urinary tract infection, site not specified: Secondary | ICD-10-CM | POA: Diagnosis not present

## 2020-03-02 DIAGNOSIS — R2689 Other abnormalities of gait and mobility: Secondary | ICD-10-CM | POA: Diagnosis not present

## 2020-03-02 DIAGNOSIS — R279 Unspecified lack of coordination: Secondary | ICD-10-CM | POA: Diagnosis not present

## 2020-03-02 DIAGNOSIS — R2681 Unsteadiness on feet: Secondary | ICD-10-CM | POA: Diagnosis not present

## 2020-03-02 DIAGNOSIS — R262 Difficulty in walking, not elsewhere classified: Secondary | ICD-10-CM | POA: Diagnosis not present

## 2020-03-02 DIAGNOSIS — R1312 Dysphagia, oropharyngeal phase: Secondary | ICD-10-CM | POA: Diagnosis not present

## 2020-03-02 DIAGNOSIS — I639 Cerebral infarction, unspecified: Secondary | ICD-10-CM | POA: Diagnosis not present

## 2020-03-02 DIAGNOSIS — M6281 Muscle weakness (generalized): Secondary | ICD-10-CM | POA: Diagnosis not present

## 2020-03-03 DIAGNOSIS — R2689 Other abnormalities of gait and mobility: Secondary | ICD-10-CM | POA: Diagnosis not present

## 2020-03-03 DIAGNOSIS — R262 Difficulty in walking, not elsewhere classified: Secondary | ICD-10-CM | POA: Diagnosis not present

## 2020-03-03 DIAGNOSIS — R279 Unspecified lack of coordination: Secondary | ICD-10-CM | POA: Diagnosis not present

## 2020-03-03 DIAGNOSIS — N390 Urinary tract infection, site not specified: Secondary | ICD-10-CM | POA: Diagnosis not present

## 2020-03-03 DIAGNOSIS — M6281 Muscle weakness (generalized): Secondary | ICD-10-CM | POA: Diagnosis not present

## 2020-03-03 DIAGNOSIS — I639 Cerebral infarction, unspecified: Secondary | ICD-10-CM | POA: Diagnosis not present

## 2020-03-03 DIAGNOSIS — R2681 Unsteadiness on feet: Secondary | ICD-10-CM | POA: Diagnosis not present

## 2020-03-03 DIAGNOSIS — R1312 Dysphagia, oropharyngeal phase: Secondary | ICD-10-CM | POA: Diagnosis not present

## 2020-03-04 DIAGNOSIS — I639 Cerebral infarction, unspecified: Secondary | ICD-10-CM | POA: Diagnosis not present

## 2020-03-04 DIAGNOSIS — R2689 Other abnormalities of gait and mobility: Secondary | ICD-10-CM | POA: Diagnosis not present

## 2020-03-04 DIAGNOSIS — R279 Unspecified lack of coordination: Secondary | ICD-10-CM | POA: Diagnosis not present

## 2020-03-04 DIAGNOSIS — R262 Difficulty in walking, not elsewhere classified: Secondary | ICD-10-CM | POA: Diagnosis not present

## 2020-03-04 DIAGNOSIS — N390 Urinary tract infection, site not specified: Secondary | ICD-10-CM | POA: Diagnosis not present

## 2020-03-04 DIAGNOSIS — M6281 Muscle weakness (generalized): Secondary | ICD-10-CM | POA: Diagnosis not present

## 2020-03-04 DIAGNOSIS — R2681 Unsteadiness on feet: Secondary | ICD-10-CM | POA: Diagnosis not present

## 2020-03-05 DIAGNOSIS — R262 Difficulty in walking, not elsewhere classified: Secondary | ICD-10-CM | POA: Diagnosis not present

## 2020-03-05 DIAGNOSIS — R279 Unspecified lack of coordination: Secondary | ICD-10-CM | POA: Diagnosis not present

## 2020-03-05 DIAGNOSIS — M6281 Muscle weakness (generalized): Secondary | ICD-10-CM | POA: Diagnosis not present

## 2020-03-05 DIAGNOSIS — N390 Urinary tract infection, site not specified: Secondary | ICD-10-CM | POA: Diagnosis not present

## 2020-03-05 DIAGNOSIS — I639 Cerebral infarction, unspecified: Secondary | ICD-10-CM | POA: Diagnosis not present

## 2020-03-05 DIAGNOSIS — R2689 Other abnormalities of gait and mobility: Secondary | ICD-10-CM | POA: Diagnosis not present

## 2020-03-05 DIAGNOSIS — R2681 Unsteadiness on feet: Secondary | ICD-10-CM | POA: Diagnosis not present

## 2020-03-06 DIAGNOSIS — R279 Unspecified lack of coordination: Secondary | ICD-10-CM | POA: Diagnosis not present

## 2020-03-06 DIAGNOSIS — I639 Cerebral infarction, unspecified: Secondary | ICD-10-CM | POA: Diagnosis not present

## 2020-03-06 DIAGNOSIS — R2681 Unsteadiness on feet: Secondary | ICD-10-CM | POA: Diagnosis not present

## 2020-03-06 DIAGNOSIS — N390 Urinary tract infection, site not specified: Secondary | ICD-10-CM | POA: Diagnosis not present

## 2020-03-06 DIAGNOSIS — R262 Difficulty in walking, not elsewhere classified: Secondary | ICD-10-CM | POA: Diagnosis not present

## 2020-03-06 DIAGNOSIS — M6281 Muscle weakness (generalized): Secondary | ICD-10-CM | POA: Diagnosis not present

## 2020-03-06 DIAGNOSIS — R2689 Other abnormalities of gait and mobility: Secondary | ICD-10-CM | POA: Diagnosis not present

## 2020-03-07 DIAGNOSIS — N390 Urinary tract infection, site not specified: Secondary | ICD-10-CM | POA: Diagnosis not present

## 2020-03-07 DIAGNOSIS — Z7189 Other specified counseling: Secondary | ICD-10-CM | POA: Diagnosis not present

## 2020-03-07 DIAGNOSIS — M6281 Muscle weakness (generalized): Secondary | ICD-10-CM | POA: Diagnosis not present

## 2020-03-07 DIAGNOSIS — I639 Cerebral infarction, unspecified: Secondary | ICD-10-CM | POA: Diagnosis not present

## 2020-03-07 DIAGNOSIS — R2681 Unsteadiness on feet: Secondary | ICD-10-CM | POA: Diagnosis not present

## 2020-03-07 DIAGNOSIS — R262 Difficulty in walking, not elsewhere classified: Secondary | ICD-10-CM | POA: Diagnosis not present

## 2020-03-07 DIAGNOSIS — R2689 Other abnormalities of gait and mobility: Secondary | ICD-10-CM | POA: Diagnosis not present

## 2020-03-07 DIAGNOSIS — R279 Unspecified lack of coordination: Secondary | ICD-10-CM | POA: Diagnosis not present

## 2020-03-08 DIAGNOSIS — I639 Cerebral infarction, unspecified: Secondary | ICD-10-CM | POA: Diagnosis not present

## 2020-03-08 DIAGNOSIS — R262 Difficulty in walking, not elsewhere classified: Secondary | ICD-10-CM | POA: Diagnosis not present

## 2020-03-08 DIAGNOSIS — R279 Unspecified lack of coordination: Secondary | ICD-10-CM | POA: Diagnosis not present

## 2020-03-08 DIAGNOSIS — N390 Urinary tract infection, site not specified: Secondary | ICD-10-CM | POA: Diagnosis not present

## 2020-03-08 DIAGNOSIS — R2681 Unsteadiness on feet: Secondary | ICD-10-CM | POA: Diagnosis not present

## 2020-03-08 DIAGNOSIS — M6281 Muscle weakness (generalized): Secondary | ICD-10-CM | POA: Diagnosis not present

## 2020-03-08 DIAGNOSIS — R2689 Other abnormalities of gait and mobility: Secondary | ICD-10-CM | POA: Diagnosis not present

## 2020-03-09 DIAGNOSIS — R279 Unspecified lack of coordination: Secondary | ICD-10-CM | POA: Diagnosis not present

## 2020-03-09 DIAGNOSIS — R2689 Other abnormalities of gait and mobility: Secondary | ICD-10-CM | POA: Diagnosis not present

## 2020-03-09 DIAGNOSIS — M6281 Muscle weakness (generalized): Secondary | ICD-10-CM | POA: Diagnosis not present

## 2020-03-09 DIAGNOSIS — I639 Cerebral infarction, unspecified: Secondary | ICD-10-CM | POA: Diagnosis not present

## 2020-03-09 DIAGNOSIS — N390 Urinary tract infection, site not specified: Secondary | ICD-10-CM | POA: Diagnosis not present

## 2020-03-09 DIAGNOSIS — R2681 Unsteadiness on feet: Secondary | ICD-10-CM | POA: Diagnosis not present

## 2020-03-09 DIAGNOSIS — R262 Difficulty in walking, not elsewhere classified: Secondary | ICD-10-CM | POA: Diagnosis not present

## 2020-03-10 DIAGNOSIS — I639 Cerebral infarction, unspecified: Secondary | ICD-10-CM | POA: Diagnosis not present

## 2020-03-10 DIAGNOSIS — R2681 Unsteadiness on feet: Secondary | ICD-10-CM | POA: Diagnosis not present

## 2020-03-10 DIAGNOSIS — N390 Urinary tract infection, site not specified: Secondary | ICD-10-CM | POA: Diagnosis not present

## 2020-03-10 DIAGNOSIS — R2689 Other abnormalities of gait and mobility: Secondary | ICD-10-CM | POA: Diagnosis not present

## 2020-03-10 DIAGNOSIS — R262 Difficulty in walking, not elsewhere classified: Secondary | ICD-10-CM | POA: Diagnosis not present

## 2020-03-10 DIAGNOSIS — R279 Unspecified lack of coordination: Secondary | ICD-10-CM | POA: Diagnosis not present

## 2020-03-10 DIAGNOSIS — M6281 Muscle weakness (generalized): Secondary | ICD-10-CM | POA: Diagnosis not present

## 2020-03-11 DIAGNOSIS — R2681 Unsteadiness on feet: Secondary | ICD-10-CM | POA: Diagnosis not present

## 2020-03-11 DIAGNOSIS — M6281 Muscle weakness (generalized): Secondary | ICD-10-CM | POA: Diagnosis not present

## 2020-03-11 DIAGNOSIS — R279 Unspecified lack of coordination: Secondary | ICD-10-CM | POA: Diagnosis not present

## 2020-03-11 DIAGNOSIS — I639 Cerebral infarction, unspecified: Secondary | ICD-10-CM | POA: Diagnosis not present

## 2020-03-11 DIAGNOSIS — R262 Difficulty in walking, not elsewhere classified: Secondary | ICD-10-CM | POA: Diagnosis not present

## 2020-03-11 DIAGNOSIS — N390 Urinary tract infection, site not specified: Secondary | ICD-10-CM | POA: Diagnosis not present

## 2020-03-11 DIAGNOSIS — R2689 Other abnormalities of gait and mobility: Secondary | ICD-10-CM | POA: Diagnosis not present

## 2020-03-12 ENCOUNTER — Non-Acute Institutional Stay: Payer: Medicare Other | Admitting: Adult Health Nurse Practitioner

## 2020-03-12 ENCOUNTER — Other Ambulatory Visit: Payer: Self-pay

## 2020-03-12 DIAGNOSIS — S91209A Unspecified open wound of unspecified toe(s) with damage to nail, initial encounter: Secondary | ICD-10-CM | POA: Diagnosis not present

## 2020-03-12 DIAGNOSIS — J1282 Pneumonia due to coronavirus disease 2019: Secondary | ICD-10-CM

## 2020-03-12 DIAGNOSIS — Z515 Encounter for palliative care: Secondary | ICD-10-CM

## 2020-03-12 DIAGNOSIS — M6281 Muscle weakness (generalized): Secondary | ICD-10-CM | POA: Diagnosis not present

## 2020-03-12 DIAGNOSIS — R262 Difficulty in walking, not elsewhere classified: Secondary | ICD-10-CM | POA: Diagnosis not present

## 2020-03-12 DIAGNOSIS — R2681 Unsteadiness on feet: Secondary | ICD-10-CM | POA: Diagnosis not present

## 2020-03-12 DIAGNOSIS — R2689 Other abnormalities of gait and mobility: Secondary | ICD-10-CM | POA: Diagnosis not present

## 2020-03-12 DIAGNOSIS — I639 Cerebral infarction, unspecified: Secondary | ICD-10-CM | POA: Diagnosis not present

## 2020-03-12 DIAGNOSIS — U071 COVID-19: Secondary | ICD-10-CM

## 2020-03-12 DIAGNOSIS — R279 Unspecified lack of coordination: Secondary | ICD-10-CM | POA: Diagnosis not present

## 2020-03-12 DIAGNOSIS — N390 Urinary tract infection, site not specified: Secondary | ICD-10-CM | POA: Diagnosis not present

## 2020-03-12 DIAGNOSIS — S99921D Unspecified injury of right foot, subsequent encounter: Secondary | ICD-10-CM | POA: Diagnosis not present

## 2020-03-12 NOTE — Progress Notes (Signed)
[facility] Consult Note Telephone: [phone]  Fax: [phone]  PATIENT NAME: Lori Berger DOB: 14-Mar-1941 MRN: [id number]  PRIMARY CARE PROVIDER:   Jerrol Berger., MD  REFERRING PROVIDER:  Dr. Warren Lacy Berger/ Lori Gash NP  RESPONSIBLE PARTY:   Lori Berger, son H: [phone] C: [phone]    RECOMMENDATIONS and PLAN:  1.  Advanced care planning.  Patient is DNR/comfort measures.  Spoke with daughter-in-law, Lori Berger, to update on visit.  2.  Functional status.  09/25/2019 patient was hospitalized for Covid infection.  She has been placed in skilled nursing since then.  Patient uses wheelchair to get around.  States that she is able to walk short distances with a walker assisted.  Requires assistance with ADLs.  Daughter-in-law states that about 3 years ago patient had stroke and was in assisted living at [facility].  She was able to walk with a walker and assist with ADLs at that time.  States that when the pandemic hit family started noticing a decline with the isolation she was in.  States that they noticed that she started losing weight but did not feel it was a significant amount and unsure of the exact amount of weight she lost.  States that when patient ended up with Covid at end of December is when she started noticing an even bigger decline and now she cannot walk without assistance and requires assistance with ADLs.  Daughter-in-law states that even though she has not been diagnosed with dementia but they have noticed some cognitive decline over the past year and became even more significant after having Covid.  Daughter-in-law states that even though she has been working with therapy since December she has not made any significant improvement in her functional status and is wondering how long they need to push her with therapy as she is starting to refuse therapy visits.   3.  Nutritional status.  Daughter-in-law  states that when they bring her food that she does eat but feels like she is not getting enough nutrition at the facility.  On 10/04/2019 when first admitted to the facility patient weighed 151.2 pounds on 02/26/2020 patient weighed 137.6 pounds with a BMI of 23.6 patient does state that she does not have much of an appetite.  Patient is currently on comfort measures at the facility.  Daughter-in-law states that she and family realized that her mother has been declining and has had a more rapid decline after having Covid in December.  States that they have seen her struggling and wonder if it is time for hospice.  Family would like hospice services if patient is eligible for hospice.  We will reach out to hospice physicians to check on hospice eligibility  I spent 30 minutes providing this consultation,  from 9:30 to 10:00 including time spent with patient/family, chart review, provider coordination, documentation. More than 50% of the time in this consultation was spent coordinating communication.   HISTORY OF PRESENT ILLNESS:  Lori Berger is a 79 y.o. year old female with multiple medical problems including dementia, DMT2, HTN, HLD, depression, frequent falls, recurrent UTIs, h/o CVA. Palliative Care was asked to help address goals of care.   CODE STATUS: DNR/comfort measures  PPS: 30% HOSPICE ELIGIBILITY/DIAGNOSIS: TBD  PHYSICAL EXAM:  HR 69  O2 95% on RA General: NAD, frail appearing, thin Cardiovascular: regular rate and rhythm Pulmonary: lung sounds clear; normal respiratory effort Abdomen: soft, nontender, + bowel sounds GU: no  suprapubic tenderness Extremities: no edema, no joint deformities Skin: no rashes on exposed skin Neurological: Weakness; A&O to person and place   PAST MEDICAL HISTORY:  Past Medical History:  Diagnosis Date  . Arthritis   . BCC (basal cell carcinoma)   . Breast cancer ([place]) 1990   left 1990  . Breast cancer ([facility]) 11/11/2015   Right, T2 (2.4 cm(, N0;  positive deep margin, ER: 100%, PR: 60%; Her 2 neu not amplified, Mammoprint: low risk  . Diabetes mellitus without complication ([place])   . Endometrial cancer ([place])   . Hyperlipidemia   . Hypertension   . Major depressive disorder   . PONV (postoperative nausea and vomiting)   . UTI (urinary tract infection)   . Yeast infection     SOCIAL HX:  Social History   Tobacco Use  . Smoking status: Never Smoker  . Smokeless tobacco: Never Used  Substance Use Topics  . Alcohol use: No    ALLERGIES:  Allergies  Allergen Reactions  . Penicillins      PERTINENT MEDICATIONS:  Outpatient Encounter Medications as of 03/12/2020  Medication Sig  . ascorbic acid (VITAMIN C) 500 MG tablet Take 1 tablet (500 mg total) by mouth daily.  Marland Kitchen aspirin EC 81 MG tablet Take 1 tablet (81 mg total) by mouth daily.  . bisacodyl (DULCOLAX) 5 MG EC tablet Take 1 tablet (5 mg total) by mouth daily as needed for moderate constipation. (Patient not taking: Reported on 01/28/2020)  . Calcium-Vitamin D-Vitamin K 010-932-35 MG-UNT-MCG TABS Take 1 tablet by mouth daily.  . Canagliflozin-metFORMIN HCl (INVOKAMET) 150-500 MG TABS Take 1 tablet by mouth 2 (two) times daily.  . clopidogrel (PLAVIX) 75 MG tablet TAKE 1 TABLET BY MOUTH  DAILY (Patient taking differently: Take 75 mg by mouth daily. )  . ELIQUIS 5 MG TABS tablet Take 5 mg by mouth 2 (two) times daily.  Marland Kitchen escitalopram (LEXAPRO) 10 MG tablet Take 10 mg by mouth daily.  . fluconazole (DIFLUCAN) 200 MG tablet Take 200 mg by mouth daily.  Marland Kitchen glucose blood test strip Check sugar daily  . guaiFENesin-dextromethorphan (ROBITUSSIN DM) 100-10 MG/5ML syrup Take 10 mLs by mouth every 4 (four) hours as needed for cough.  . insulin aspart (NOVOLOG) 100 UNIT/ML injection Inject 6 Units into the skin 3 (three) times daily with meals for 5 days.  . Ipratropium-Albuterol (COMBIVENT) 20-100 MCG/ACT AERS respimat Inhale 1 puff into the lungs every 6 (six) hours. (Patient not taking:  Reported on 01/28/2020)  . letrozole (FEMARA) 2.5 MG tablet TAKE 1 TABLET BY MOUTH  DAILY (Patient taking differently: Take 2.5 mg by mouth daily. )  . mirtazapine (REMERON) 15 MG tablet Take 1 tablet (15 mg total) by mouth at bedtime.  . Multiple Vitamin (MULTIVITAMIN WITH MINERALS) TABS tablet Take 1 tablet by mouth daily. (Patient not taking: Reported on 01/28/2020)  . Nutritional Supplements (FEEDING SUPPLEMENT, NEPRO CARB STEADY,) LIQD Take 237 mLs by mouth 3 (three) times daily between meals.  . nystatin cream (MYCOSTATIN) Apply 1 application topically 2 (two) times daily. (Patient not taking: Reported on 01/28/2020)  . ondansetron (ZOFRAN) 4 MG tablet Take 1 tablet (4 mg total) by mouth every 6 (six) hours as needed for nausea. (Patient not taking: Reported on 01/28/2020)  . simvastatin (ZOCOR) 10 MG tablet TAKE 1 TABLET BY MOUTH AT  BEDTIME (Patient taking differently: Take 10 mg by mouth at bedtime. )  . traZODone (DESYREL) 50 MG tablet Take 0.5 tablets (25 mg total)  by mouth at bedtime as needed for sleep.  Marland Kitchen zinc sulfate 220 (50 Zn) MG capsule Take 1 capsule (220 mg total) by mouth daily. (Patient not taking: Reported on 01/28/2020)   No facility-administered encounter medications on file as of 03/12/2020.     Loretta Kluender Jenetta Downer, NP

## 2020-03-13 ENCOUNTER — Telehealth: Payer: Self-pay | Admitting: Adult Health Nurse Practitioner

## 2020-03-13 DIAGNOSIS — R262 Difficulty in walking, not elsewhere classified: Secondary | ICD-10-CM | POA: Diagnosis not present

## 2020-03-13 DIAGNOSIS — N390 Urinary tract infection, site not specified: Secondary | ICD-10-CM | POA: Diagnosis not present

## 2020-03-13 DIAGNOSIS — R279 Unspecified lack of coordination: Secondary | ICD-10-CM | POA: Diagnosis not present

## 2020-03-13 DIAGNOSIS — R2681 Unsteadiness on feet: Secondary | ICD-10-CM | POA: Diagnosis not present

## 2020-03-13 DIAGNOSIS — U071 COVID-19: Secondary | ICD-10-CM | POA: Diagnosis not present

## 2020-03-13 DIAGNOSIS — M6281 Muscle weakness (generalized): Secondary | ICD-10-CM | POA: Diagnosis not present

## 2020-03-13 DIAGNOSIS — I639 Cerebral infarction, unspecified: Secondary | ICD-10-CM | POA: Diagnosis not present

## 2020-03-13 DIAGNOSIS — R2689 Other abnormalities of gait and mobility: Secondary | ICD-10-CM | POA: Diagnosis not present

## 2020-03-13 NOTE — Telephone Encounter (Signed)
Called daughter in law to update on conversation with hospice physicians.  Left VM with reason for call and call back info Jaggar Benko K. Olena Heckle NP

## 2020-03-14 DIAGNOSIS — R262 Difficulty in walking, not elsewhere classified: Secondary | ICD-10-CM | POA: Diagnosis not present

## 2020-03-14 DIAGNOSIS — R2681 Unsteadiness on feet: Secondary | ICD-10-CM | POA: Diagnosis not present

## 2020-03-14 DIAGNOSIS — N390 Urinary tract infection, site not specified: Secondary | ICD-10-CM | POA: Diagnosis not present

## 2020-03-14 DIAGNOSIS — M6281 Muscle weakness (generalized): Secondary | ICD-10-CM | POA: Diagnosis not present

## 2020-03-14 DIAGNOSIS — I639 Cerebral infarction, unspecified: Secondary | ICD-10-CM | POA: Diagnosis not present

## 2020-03-14 DIAGNOSIS — R279 Unspecified lack of coordination: Secondary | ICD-10-CM | POA: Diagnosis not present

## 2020-03-14 DIAGNOSIS — R2689 Other abnormalities of gait and mobility: Secondary | ICD-10-CM | POA: Diagnosis not present

## 2020-03-15 DIAGNOSIS — R2689 Other abnormalities of gait and mobility: Secondary | ICD-10-CM | POA: Diagnosis not present

## 2020-03-15 DIAGNOSIS — R2681 Unsteadiness on feet: Secondary | ICD-10-CM | POA: Diagnosis not present

## 2020-03-15 DIAGNOSIS — M6281 Muscle weakness (generalized): Secondary | ICD-10-CM | POA: Diagnosis not present

## 2020-03-15 DIAGNOSIS — I639 Cerebral infarction, unspecified: Secondary | ICD-10-CM | POA: Diagnosis not present

## 2020-03-15 DIAGNOSIS — N390 Urinary tract infection, site not specified: Secondary | ICD-10-CM | POA: Diagnosis not present

## 2020-03-15 DIAGNOSIS — R279 Unspecified lack of coordination: Secondary | ICD-10-CM | POA: Diagnosis not present

## 2020-03-15 DIAGNOSIS — R262 Difficulty in walking, not elsewhere classified: Secondary | ICD-10-CM | POA: Diagnosis not present

## 2020-03-17 DIAGNOSIS — N390 Urinary tract infection, site not specified: Secondary | ICD-10-CM | POA: Diagnosis not present

## 2020-03-17 DIAGNOSIS — R2689 Other abnormalities of gait and mobility: Secondary | ICD-10-CM | POA: Diagnosis not present

## 2020-03-17 DIAGNOSIS — R279 Unspecified lack of coordination: Secondary | ICD-10-CM | POA: Diagnosis not present

## 2020-03-17 DIAGNOSIS — R262 Difficulty in walking, not elsewhere classified: Secondary | ICD-10-CM | POA: Diagnosis not present

## 2020-03-17 DIAGNOSIS — I639 Cerebral infarction, unspecified: Secondary | ICD-10-CM | POA: Diagnosis not present

## 2020-03-17 DIAGNOSIS — M6281 Muscle weakness (generalized): Secondary | ICD-10-CM | POA: Diagnosis not present

## 2020-03-17 DIAGNOSIS — R2681 Unsteadiness on feet: Secondary | ICD-10-CM | POA: Diagnosis not present

## 2020-03-18 DIAGNOSIS — R2689 Other abnormalities of gait and mobility: Secondary | ICD-10-CM | POA: Diagnosis not present

## 2020-03-18 DIAGNOSIS — M6281 Muscle weakness (generalized): Secondary | ICD-10-CM | POA: Diagnosis not present

## 2020-03-18 DIAGNOSIS — R279 Unspecified lack of coordination: Secondary | ICD-10-CM | POA: Diagnosis not present

## 2020-03-18 DIAGNOSIS — R262 Difficulty in walking, not elsewhere classified: Secondary | ICD-10-CM | POA: Diagnosis not present

## 2020-03-18 DIAGNOSIS — I639 Cerebral infarction, unspecified: Secondary | ICD-10-CM | POA: Diagnosis not present

## 2020-03-18 DIAGNOSIS — R2681 Unsteadiness on feet: Secondary | ICD-10-CM | POA: Diagnosis not present

## 2020-03-18 DIAGNOSIS — N390 Urinary tract infection, site not specified: Secondary | ICD-10-CM | POA: Diagnosis not present

## 2020-03-19 DIAGNOSIS — I639 Cerebral infarction, unspecified: Secondary | ICD-10-CM | POA: Diagnosis not present

## 2020-03-19 DIAGNOSIS — R262 Difficulty in walking, not elsewhere classified: Secondary | ICD-10-CM | POA: Diagnosis not present

## 2020-03-19 DIAGNOSIS — R2689 Other abnormalities of gait and mobility: Secondary | ICD-10-CM | POA: Diagnosis not present

## 2020-03-19 DIAGNOSIS — N390 Urinary tract infection, site not specified: Secondary | ICD-10-CM | POA: Diagnosis not present

## 2020-03-19 DIAGNOSIS — M6281 Muscle weakness (generalized): Secondary | ICD-10-CM | POA: Diagnosis not present

## 2020-03-19 DIAGNOSIS — R279 Unspecified lack of coordination: Secondary | ICD-10-CM | POA: Diagnosis not present

## 2020-03-19 DIAGNOSIS — R2681 Unsteadiness on feet: Secondary | ICD-10-CM | POA: Diagnosis not present

## 2020-03-20 DIAGNOSIS — R2689 Other abnormalities of gait and mobility: Secondary | ICD-10-CM | POA: Diagnosis not present

## 2020-03-20 DIAGNOSIS — I639 Cerebral infarction, unspecified: Secondary | ICD-10-CM | POA: Diagnosis not present

## 2020-03-20 DIAGNOSIS — R262 Difficulty in walking, not elsewhere classified: Secondary | ICD-10-CM | POA: Diagnosis not present

## 2020-03-20 DIAGNOSIS — R2681 Unsteadiness on feet: Secondary | ICD-10-CM | POA: Diagnosis not present

## 2020-03-20 DIAGNOSIS — R279 Unspecified lack of coordination: Secondary | ICD-10-CM | POA: Diagnosis not present

## 2020-03-20 DIAGNOSIS — M6281 Muscle weakness (generalized): Secondary | ICD-10-CM | POA: Diagnosis not present

## 2020-03-20 DIAGNOSIS — N390 Urinary tract infection, site not specified: Secondary | ICD-10-CM | POA: Diagnosis not present

## 2020-03-21 DIAGNOSIS — R2681 Unsteadiness on feet: Secondary | ICD-10-CM | POA: Diagnosis not present

## 2020-03-21 DIAGNOSIS — M6281 Muscle weakness (generalized): Secondary | ICD-10-CM | POA: Diagnosis not present

## 2020-03-21 DIAGNOSIS — R279 Unspecified lack of coordination: Secondary | ICD-10-CM | POA: Diagnosis not present

## 2020-03-21 DIAGNOSIS — R262 Difficulty in walking, not elsewhere classified: Secondary | ICD-10-CM | POA: Diagnosis not present

## 2020-03-21 DIAGNOSIS — N390 Urinary tract infection, site not specified: Secondary | ICD-10-CM | POA: Diagnosis not present

## 2020-03-21 DIAGNOSIS — R2689 Other abnormalities of gait and mobility: Secondary | ICD-10-CM | POA: Diagnosis not present

## 2020-03-21 DIAGNOSIS — I639 Cerebral infarction, unspecified: Secondary | ICD-10-CM | POA: Diagnosis not present

## 2020-03-23 DIAGNOSIS — R262 Difficulty in walking, not elsewhere classified: Secondary | ICD-10-CM | POA: Diagnosis not present

## 2020-03-23 DIAGNOSIS — R2681 Unsteadiness on feet: Secondary | ICD-10-CM | POA: Diagnosis not present

## 2020-03-23 DIAGNOSIS — R2689 Other abnormalities of gait and mobility: Secondary | ICD-10-CM | POA: Diagnosis not present

## 2020-03-23 DIAGNOSIS — N390 Urinary tract infection, site not specified: Secondary | ICD-10-CM | POA: Diagnosis not present

## 2020-03-23 DIAGNOSIS — R279 Unspecified lack of coordination: Secondary | ICD-10-CM | POA: Diagnosis not present

## 2020-03-23 DIAGNOSIS — M6281 Muscle weakness (generalized): Secondary | ICD-10-CM | POA: Diagnosis not present

## 2020-03-23 DIAGNOSIS — I639 Cerebral infarction, unspecified: Secondary | ICD-10-CM | POA: Diagnosis not present

## 2020-03-24 DIAGNOSIS — N390 Urinary tract infection, site not specified: Secondary | ICD-10-CM | POA: Diagnosis not present

## 2020-03-24 DIAGNOSIS — R2689 Other abnormalities of gait and mobility: Secondary | ICD-10-CM | POA: Diagnosis not present

## 2020-03-24 DIAGNOSIS — R2681 Unsteadiness on feet: Secondary | ICD-10-CM | POA: Diagnosis not present

## 2020-03-24 DIAGNOSIS — R262 Difficulty in walking, not elsewhere classified: Secondary | ICD-10-CM | POA: Diagnosis not present

## 2020-03-24 DIAGNOSIS — R279 Unspecified lack of coordination: Secondary | ICD-10-CM | POA: Diagnosis not present

## 2020-03-24 DIAGNOSIS — I639 Cerebral infarction, unspecified: Secondary | ICD-10-CM | POA: Diagnosis not present

## 2020-03-24 DIAGNOSIS — M6281 Muscle weakness (generalized): Secondary | ICD-10-CM | POA: Diagnosis not present

## 2020-03-25 DIAGNOSIS — M6281 Muscle weakness (generalized): Secondary | ICD-10-CM | POA: Diagnosis not present

## 2020-03-25 DIAGNOSIS — N390 Urinary tract infection, site not specified: Secondary | ICD-10-CM | POA: Diagnosis not present

## 2020-03-25 DIAGNOSIS — R2681 Unsteadiness on feet: Secondary | ICD-10-CM | POA: Diagnosis not present

## 2020-03-25 DIAGNOSIS — R262 Difficulty in walking, not elsewhere classified: Secondary | ICD-10-CM | POA: Diagnosis not present

## 2020-03-25 DIAGNOSIS — R2689 Other abnormalities of gait and mobility: Secondary | ICD-10-CM | POA: Diagnosis not present

## 2020-03-25 DIAGNOSIS — I639 Cerebral infarction, unspecified: Secondary | ICD-10-CM | POA: Diagnosis not present

## 2020-03-25 DIAGNOSIS — R279 Unspecified lack of coordination: Secondary | ICD-10-CM | POA: Diagnosis not present

## 2020-03-26 DIAGNOSIS — R2689 Other abnormalities of gait and mobility: Secondary | ICD-10-CM | POA: Diagnosis not present

## 2020-03-26 DIAGNOSIS — M6281 Muscle weakness (generalized): Secondary | ICD-10-CM | POA: Diagnosis not present

## 2020-03-26 DIAGNOSIS — R262 Difficulty in walking, not elsewhere classified: Secondary | ICD-10-CM | POA: Diagnosis not present

## 2020-03-26 DIAGNOSIS — R2681 Unsteadiness on feet: Secondary | ICD-10-CM | POA: Diagnosis not present

## 2020-03-26 DIAGNOSIS — N390 Urinary tract infection, site not specified: Secondary | ICD-10-CM | POA: Diagnosis not present

## 2020-03-26 DIAGNOSIS — R279 Unspecified lack of coordination: Secondary | ICD-10-CM | POA: Diagnosis not present

## 2020-03-26 DIAGNOSIS — I639 Cerebral infarction, unspecified: Secondary | ICD-10-CM | POA: Diagnosis not present

## 2020-03-27 DIAGNOSIS — R2681 Unsteadiness on feet: Secondary | ICD-10-CM | POA: Diagnosis not present

## 2020-03-27 DIAGNOSIS — M6281 Muscle weakness (generalized): Secondary | ICD-10-CM | POA: Diagnosis not present

## 2020-03-27 DIAGNOSIS — N390 Urinary tract infection, site not specified: Secondary | ICD-10-CM | POA: Diagnosis not present

## 2020-03-27 DIAGNOSIS — R279 Unspecified lack of coordination: Secondary | ICD-10-CM | POA: Diagnosis not present

## 2020-03-27 DIAGNOSIS — I639 Cerebral infarction, unspecified: Secondary | ICD-10-CM | POA: Diagnosis not present

## 2020-03-27 DIAGNOSIS — R262 Difficulty in walking, not elsewhere classified: Secondary | ICD-10-CM | POA: Diagnosis not present

## 2020-03-27 DIAGNOSIS — R2689 Other abnormalities of gait and mobility: Secondary | ICD-10-CM | POA: Diagnosis not present

## 2020-03-28 DIAGNOSIS — N390 Urinary tract infection, site not specified: Secondary | ICD-10-CM | POA: Diagnosis not present

## 2020-03-28 DIAGNOSIS — R2689 Other abnormalities of gait and mobility: Secondary | ICD-10-CM | POA: Diagnosis not present

## 2020-03-28 DIAGNOSIS — M6281 Muscle weakness (generalized): Secondary | ICD-10-CM | POA: Diagnosis not present

## 2020-03-28 DIAGNOSIS — I639 Cerebral infarction, unspecified: Secondary | ICD-10-CM | POA: Diagnosis not present

## 2020-03-28 DIAGNOSIS — R262 Difficulty in walking, not elsewhere classified: Secondary | ICD-10-CM | POA: Diagnosis not present

## 2020-03-28 DIAGNOSIS — R2681 Unsteadiness on feet: Secondary | ICD-10-CM | POA: Diagnosis not present

## 2020-03-28 DIAGNOSIS — R279 Unspecified lack of coordination: Secondary | ICD-10-CM | POA: Diagnosis not present

## 2020-03-29 DIAGNOSIS — R262 Difficulty in walking, not elsewhere classified: Secondary | ICD-10-CM | POA: Diagnosis not present

## 2020-03-29 DIAGNOSIS — N390 Urinary tract infection, site not specified: Secondary | ICD-10-CM | POA: Diagnosis not present

## 2020-03-29 DIAGNOSIS — R2681 Unsteadiness on feet: Secondary | ICD-10-CM | POA: Diagnosis not present

## 2020-03-29 DIAGNOSIS — R279 Unspecified lack of coordination: Secondary | ICD-10-CM | POA: Diagnosis not present

## 2020-03-29 DIAGNOSIS — I639 Cerebral infarction, unspecified: Secondary | ICD-10-CM | POA: Diagnosis not present

## 2020-03-29 DIAGNOSIS — M6281 Muscle weakness (generalized): Secondary | ICD-10-CM | POA: Diagnosis not present

## 2020-03-29 DIAGNOSIS — R2689 Other abnormalities of gait and mobility: Secondary | ICD-10-CM | POA: Diagnosis not present

## 2020-03-30 DIAGNOSIS — R262 Difficulty in walking, not elsewhere classified: Secondary | ICD-10-CM | POA: Diagnosis not present

## 2020-03-30 DIAGNOSIS — M6281 Muscle weakness (generalized): Secondary | ICD-10-CM | POA: Diagnosis not present

## 2020-03-30 DIAGNOSIS — I639 Cerebral infarction, unspecified: Secondary | ICD-10-CM | POA: Diagnosis not present

## 2020-03-30 DIAGNOSIS — R2689 Other abnormalities of gait and mobility: Secondary | ICD-10-CM | POA: Diagnosis not present

## 2020-03-30 DIAGNOSIS — R2681 Unsteadiness on feet: Secondary | ICD-10-CM | POA: Diagnosis not present

## 2020-03-30 DIAGNOSIS — R279 Unspecified lack of coordination: Secondary | ICD-10-CM | POA: Diagnosis not present

## 2020-03-30 DIAGNOSIS — N390 Urinary tract infection, site not specified: Secondary | ICD-10-CM | POA: Diagnosis not present

## 2020-03-31 DIAGNOSIS — I639 Cerebral infarction, unspecified: Secondary | ICD-10-CM | POA: Diagnosis not present

## 2020-03-31 DIAGNOSIS — M6281 Muscle weakness (generalized): Secondary | ICD-10-CM | POA: Diagnosis not present

## 2020-03-31 DIAGNOSIS — N390 Urinary tract infection, site not specified: Secondary | ICD-10-CM | POA: Diagnosis not present

## 2020-03-31 DIAGNOSIS — R262 Difficulty in walking, not elsewhere classified: Secondary | ICD-10-CM | POA: Diagnosis not present

## 2020-03-31 DIAGNOSIS — R279 Unspecified lack of coordination: Secondary | ICD-10-CM | POA: Diagnosis not present

## 2020-03-31 DIAGNOSIS — R2689 Other abnormalities of gait and mobility: Secondary | ICD-10-CM | POA: Diagnosis not present

## 2020-03-31 DIAGNOSIS — R2681 Unsteadiness on feet: Secondary | ICD-10-CM | POA: Diagnosis not present

## 2020-04-01 DIAGNOSIS — N390 Urinary tract infection, site not specified: Secondary | ICD-10-CM | POA: Diagnosis not present

## 2020-04-01 DIAGNOSIS — R2689 Other abnormalities of gait and mobility: Secondary | ICD-10-CM | POA: Diagnosis not present

## 2020-04-01 DIAGNOSIS — M6281 Muscle weakness (generalized): Secondary | ICD-10-CM | POA: Diagnosis not present

## 2020-04-01 DIAGNOSIS — I639 Cerebral infarction, unspecified: Secondary | ICD-10-CM | POA: Diagnosis not present

## 2020-04-01 DIAGNOSIS — R2681 Unsteadiness on feet: Secondary | ICD-10-CM | POA: Diagnosis not present

## 2020-04-01 DIAGNOSIS — R279 Unspecified lack of coordination: Secondary | ICD-10-CM | POA: Diagnosis not present

## 2020-04-01 DIAGNOSIS — R262 Difficulty in walking, not elsewhere classified: Secondary | ICD-10-CM | POA: Diagnosis not present

## 2020-04-02 DIAGNOSIS — I639 Cerebral infarction, unspecified: Secondary | ICD-10-CM | POA: Diagnosis not present

## 2020-04-02 DIAGNOSIS — M6281 Muscle weakness (generalized): Secondary | ICD-10-CM | POA: Diagnosis not present

## 2020-04-02 DIAGNOSIS — R2681 Unsteadiness on feet: Secondary | ICD-10-CM | POA: Diagnosis not present

## 2020-04-02 DIAGNOSIS — R2689 Other abnormalities of gait and mobility: Secondary | ICD-10-CM | POA: Diagnosis not present

## 2020-04-02 DIAGNOSIS — N390 Urinary tract infection, site not specified: Secondary | ICD-10-CM | POA: Diagnosis not present

## 2020-04-02 DIAGNOSIS — R262 Difficulty in walking, not elsewhere classified: Secondary | ICD-10-CM | POA: Diagnosis not present

## 2020-04-02 DIAGNOSIS — R279 Unspecified lack of coordination: Secondary | ICD-10-CM | POA: Diagnosis not present

## 2020-04-03 DIAGNOSIS — R262 Difficulty in walking, not elsewhere classified: Secondary | ICD-10-CM | POA: Diagnosis not present

## 2020-04-03 DIAGNOSIS — R279 Unspecified lack of coordination: Secondary | ICD-10-CM | POA: Diagnosis not present

## 2020-04-03 DIAGNOSIS — M6281 Muscle weakness (generalized): Secondary | ICD-10-CM | POA: Diagnosis not present

## 2020-04-03 DIAGNOSIS — R2681 Unsteadiness on feet: Secondary | ICD-10-CM | POA: Diagnosis not present

## 2020-04-03 DIAGNOSIS — N390 Urinary tract infection, site not specified: Secondary | ICD-10-CM | POA: Diagnosis not present

## 2020-04-03 DIAGNOSIS — I639 Cerebral infarction, unspecified: Secondary | ICD-10-CM | POA: Diagnosis not present

## 2020-04-03 DIAGNOSIS — R2689 Other abnormalities of gait and mobility: Secondary | ICD-10-CM | POA: Diagnosis not present

## 2020-04-04 DIAGNOSIS — N390 Urinary tract infection, site not specified: Secondary | ICD-10-CM | POA: Diagnosis not present

## 2020-04-04 DIAGNOSIS — R262 Difficulty in walking, not elsewhere classified: Secondary | ICD-10-CM | POA: Diagnosis not present

## 2020-04-04 DIAGNOSIS — I639 Cerebral infarction, unspecified: Secondary | ICD-10-CM | POA: Diagnosis not present

## 2020-04-04 DIAGNOSIS — M6281 Muscle weakness (generalized): Secondary | ICD-10-CM | POA: Diagnosis not present

## 2020-04-04 DIAGNOSIS — R2689 Other abnormalities of gait and mobility: Secondary | ICD-10-CM | POA: Diagnosis not present

## 2020-04-04 DIAGNOSIS — R279 Unspecified lack of coordination: Secondary | ICD-10-CM | POA: Diagnosis not present

## 2020-04-04 DIAGNOSIS — R2681 Unsteadiness on feet: Secondary | ICD-10-CM | POA: Diagnosis not present

## 2020-04-05 DIAGNOSIS — R279 Unspecified lack of coordination: Secondary | ICD-10-CM | POA: Diagnosis not present

## 2020-04-05 DIAGNOSIS — R2681 Unsteadiness on feet: Secondary | ICD-10-CM | POA: Diagnosis not present

## 2020-04-05 DIAGNOSIS — M6281 Muscle weakness (generalized): Secondary | ICD-10-CM | POA: Diagnosis not present

## 2020-04-05 DIAGNOSIS — N390 Urinary tract infection, site not specified: Secondary | ICD-10-CM | POA: Diagnosis not present

## 2020-04-05 DIAGNOSIS — R262 Difficulty in walking, not elsewhere classified: Secondary | ICD-10-CM | POA: Diagnosis not present

## 2020-04-05 DIAGNOSIS — R2689 Other abnormalities of gait and mobility: Secondary | ICD-10-CM | POA: Diagnosis not present

## 2020-04-05 DIAGNOSIS — I639 Cerebral infarction, unspecified: Secondary | ICD-10-CM | POA: Diagnosis not present

## 2020-04-06 DIAGNOSIS — R2681 Unsteadiness on feet: Secondary | ICD-10-CM | POA: Diagnosis not present

## 2020-04-06 DIAGNOSIS — M6281 Muscle weakness (generalized): Secondary | ICD-10-CM | POA: Diagnosis not present

## 2020-04-06 DIAGNOSIS — R262 Difficulty in walking, not elsewhere classified: Secondary | ICD-10-CM | POA: Diagnosis not present

## 2020-04-06 DIAGNOSIS — R279 Unspecified lack of coordination: Secondary | ICD-10-CM | POA: Diagnosis not present

## 2020-04-06 DIAGNOSIS — R2689 Other abnormalities of gait and mobility: Secondary | ICD-10-CM | POA: Diagnosis not present

## 2020-04-06 DIAGNOSIS — N390 Urinary tract infection, site not specified: Secondary | ICD-10-CM | POA: Diagnosis not present

## 2020-04-06 DIAGNOSIS — I639 Cerebral infarction, unspecified: Secondary | ICD-10-CM | POA: Diagnosis not present

## 2020-04-07 DIAGNOSIS — M6281 Muscle weakness (generalized): Secondary | ICD-10-CM | POA: Diagnosis not present

## 2020-04-07 DIAGNOSIS — R2681 Unsteadiness on feet: Secondary | ICD-10-CM | POA: Diagnosis not present

## 2020-04-07 DIAGNOSIS — R262 Difficulty in walking, not elsewhere classified: Secondary | ICD-10-CM | POA: Diagnosis not present

## 2020-04-07 DIAGNOSIS — N390 Urinary tract infection, site not specified: Secondary | ICD-10-CM | POA: Diagnosis not present

## 2020-04-07 DIAGNOSIS — I639 Cerebral infarction, unspecified: Secondary | ICD-10-CM | POA: Diagnosis not present

## 2020-04-07 DIAGNOSIS — R2689 Other abnormalities of gait and mobility: Secondary | ICD-10-CM | POA: Diagnosis not present

## 2020-04-07 DIAGNOSIS — R279 Unspecified lack of coordination: Secondary | ICD-10-CM | POA: Diagnosis not present

## 2020-04-08 DIAGNOSIS — N390 Urinary tract infection, site not specified: Secondary | ICD-10-CM | POA: Diagnosis not present

## 2020-04-08 DIAGNOSIS — M6281 Muscle weakness (generalized): Secondary | ICD-10-CM | POA: Diagnosis not present

## 2020-04-08 DIAGNOSIS — R262 Difficulty in walking, not elsewhere classified: Secondary | ICD-10-CM | POA: Diagnosis not present

## 2020-04-08 DIAGNOSIS — R2689 Other abnormalities of gait and mobility: Secondary | ICD-10-CM | POA: Diagnosis not present

## 2020-04-08 DIAGNOSIS — I639 Cerebral infarction, unspecified: Secondary | ICD-10-CM | POA: Diagnosis not present

## 2020-04-08 DIAGNOSIS — R279 Unspecified lack of coordination: Secondary | ICD-10-CM | POA: Diagnosis not present

## 2020-04-08 DIAGNOSIS — R2681 Unsteadiness on feet: Secondary | ICD-10-CM | POA: Diagnosis not present

## 2020-04-09 DIAGNOSIS — N390 Urinary tract infection, site not specified: Secondary | ICD-10-CM | POA: Diagnosis not present

## 2020-04-09 DIAGNOSIS — R2681 Unsteadiness on feet: Secondary | ICD-10-CM | POA: Diagnosis not present

## 2020-04-09 DIAGNOSIS — D649 Anemia, unspecified: Secondary | ICD-10-CM | POA: Diagnosis not present

## 2020-04-09 DIAGNOSIS — I10 Essential (primary) hypertension: Secondary | ICD-10-CM | POA: Diagnosis not present

## 2020-04-09 DIAGNOSIS — I82412 Acute embolism and thrombosis of left femoral vein: Secondary | ICD-10-CM | POA: Diagnosis not present

## 2020-04-09 DIAGNOSIS — I639 Cerebral infarction, unspecified: Secondary | ICD-10-CM | POA: Diagnosis not present

## 2020-04-09 DIAGNOSIS — M6281 Muscle weakness (generalized): Secondary | ICD-10-CM | POA: Diagnosis not present

## 2020-04-09 DIAGNOSIS — R2689 Other abnormalities of gait and mobility: Secondary | ICD-10-CM | POA: Diagnosis not present

## 2020-04-09 DIAGNOSIS — R279 Unspecified lack of coordination: Secondary | ICD-10-CM | POA: Diagnosis not present

## 2020-04-09 DIAGNOSIS — R262 Difficulty in walking, not elsewhere classified: Secondary | ICD-10-CM | POA: Diagnosis not present

## 2020-04-09 DIAGNOSIS — T07XXXA Unspecified multiple injuries, initial encounter: Secondary | ICD-10-CM | POA: Diagnosis not present

## 2020-04-09 DIAGNOSIS — E119 Type 2 diabetes mellitus without complications: Secondary | ICD-10-CM | POA: Diagnosis not present

## 2020-04-10 DIAGNOSIS — R2681 Unsteadiness on feet: Secondary | ICD-10-CM | POA: Diagnosis not present

## 2020-04-10 DIAGNOSIS — M6281 Muscle weakness (generalized): Secondary | ICD-10-CM | POA: Diagnosis not present

## 2020-04-10 DIAGNOSIS — I639 Cerebral infarction, unspecified: Secondary | ICD-10-CM | POA: Diagnosis not present

## 2020-04-10 DIAGNOSIS — R279 Unspecified lack of coordination: Secondary | ICD-10-CM | POA: Diagnosis not present

## 2020-04-10 DIAGNOSIS — R262 Difficulty in walking, not elsewhere classified: Secondary | ICD-10-CM | POA: Diagnosis not present

## 2020-04-10 DIAGNOSIS — N390 Urinary tract infection, site not specified: Secondary | ICD-10-CM | POA: Diagnosis not present

## 2020-04-10 DIAGNOSIS — T07XXXA Unspecified multiple injuries, initial encounter: Secondary | ICD-10-CM | POA: Diagnosis not present

## 2020-04-10 DIAGNOSIS — R2689 Other abnormalities of gait and mobility: Secondary | ICD-10-CM | POA: Diagnosis not present

## 2020-04-11 DIAGNOSIS — R262 Difficulty in walking, not elsewhere classified: Secondary | ICD-10-CM | POA: Diagnosis not present

## 2020-04-11 DIAGNOSIS — I639 Cerebral infarction, unspecified: Secondary | ICD-10-CM | POA: Diagnosis not present

## 2020-04-11 DIAGNOSIS — M6281 Muscle weakness (generalized): Secondary | ICD-10-CM | POA: Diagnosis not present

## 2020-04-11 DIAGNOSIS — R279 Unspecified lack of coordination: Secondary | ICD-10-CM | POA: Diagnosis not present

## 2020-04-11 DIAGNOSIS — N390 Urinary tract infection, site not specified: Secondary | ICD-10-CM | POA: Diagnosis not present

## 2020-04-11 DIAGNOSIS — R2681 Unsteadiness on feet: Secondary | ICD-10-CM | POA: Diagnosis not present

## 2020-04-11 DIAGNOSIS — R2689 Other abnormalities of gait and mobility: Secondary | ICD-10-CM | POA: Diagnosis not present

## 2020-04-12 DIAGNOSIS — R279 Unspecified lack of coordination: Secondary | ICD-10-CM | POA: Diagnosis not present

## 2020-04-12 DIAGNOSIS — R2681 Unsteadiness on feet: Secondary | ICD-10-CM | POA: Diagnosis not present

## 2020-04-12 DIAGNOSIS — R2689 Other abnormalities of gait and mobility: Secondary | ICD-10-CM | POA: Diagnosis not present

## 2020-04-12 DIAGNOSIS — N390 Urinary tract infection, site not specified: Secondary | ICD-10-CM | POA: Diagnosis not present

## 2020-04-12 DIAGNOSIS — R262 Difficulty in walking, not elsewhere classified: Secondary | ICD-10-CM | POA: Diagnosis not present

## 2020-04-12 DIAGNOSIS — M6281 Muscle weakness (generalized): Secondary | ICD-10-CM | POA: Diagnosis not present

## 2020-04-12 DIAGNOSIS — I639 Cerebral infarction, unspecified: Secondary | ICD-10-CM | POA: Diagnosis not present

## 2020-04-14 DIAGNOSIS — R279 Unspecified lack of coordination: Secondary | ICD-10-CM | POA: Diagnosis not present

## 2020-04-14 DIAGNOSIS — M6281 Muscle weakness (generalized): Secondary | ICD-10-CM | POA: Diagnosis not present

## 2020-04-14 DIAGNOSIS — I639 Cerebral infarction, unspecified: Secondary | ICD-10-CM | POA: Diagnosis not present

## 2020-04-14 DIAGNOSIS — R2681 Unsteadiness on feet: Secondary | ICD-10-CM | POA: Diagnosis not present

## 2020-04-14 DIAGNOSIS — R262 Difficulty in walking, not elsewhere classified: Secondary | ICD-10-CM | POA: Diagnosis not present

## 2020-04-14 DIAGNOSIS — R2689 Other abnormalities of gait and mobility: Secondary | ICD-10-CM | POA: Diagnosis not present

## 2020-04-14 DIAGNOSIS — N390 Urinary tract infection, site not specified: Secondary | ICD-10-CM | POA: Diagnosis not present

## 2020-04-15 DIAGNOSIS — I639 Cerebral infarction, unspecified: Secondary | ICD-10-CM | POA: Diagnosis not present

## 2020-04-15 DIAGNOSIS — M6281 Muscle weakness (generalized): Secondary | ICD-10-CM | POA: Diagnosis not present

## 2020-04-15 DIAGNOSIS — R2681 Unsteadiness on feet: Secondary | ICD-10-CM | POA: Diagnosis not present

## 2020-04-15 DIAGNOSIS — T07XXXA Unspecified multiple injuries, initial encounter: Secondary | ICD-10-CM | POA: Diagnosis not present

## 2020-04-15 DIAGNOSIS — N390 Urinary tract infection, site not specified: Secondary | ICD-10-CM | POA: Diagnosis not present

## 2020-04-15 DIAGNOSIS — R2689 Other abnormalities of gait and mobility: Secondary | ICD-10-CM | POA: Diagnosis not present

## 2020-04-15 DIAGNOSIS — L299 Pruritus, unspecified: Secondary | ICD-10-CM | POA: Diagnosis not present

## 2020-04-15 DIAGNOSIS — R262 Difficulty in walking, not elsewhere classified: Secondary | ICD-10-CM | POA: Diagnosis not present

## 2020-04-15 DIAGNOSIS — R279 Unspecified lack of coordination: Secondary | ICD-10-CM | POA: Diagnosis not present

## 2020-04-17 DIAGNOSIS — R2689 Other abnormalities of gait and mobility: Secondary | ICD-10-CM | POA: Diagnosis not present

## 2020-04-17 DIAGNOSIS — M6281 Muscle weakness (generalized): Secondary | ICD-10-CM | POA: Diagnosis not present

## 2020-04-17 DIAGNOSIS — R262 Difficulty in walking, not elsewhere classified: Secondary | ICD-10-CM | POA: Diagnosis not present

## 2020-04-17 DIAGNOSIS — R2681 Unsteadiness on feet: Secondary | ICD-10-CM | POA: Diagnosis not present

## 2020-04-17 DIAGNOSIS — N390 Urinary tract infection, site not specified: Secondary | ICD-10-CM | POA: Diagnosis not present

## 2020-04-17 DIAGNOSIS — I639 Cerebral infarction, unspecified: Secondary | ICD-10-CM | POA: Diagnosis not present

## 2020-04-17 DIAGNOSIS — R279 Unspecified lack of coordination: Secondary | ICD-10-CM | POA: Diagnosis not present

## 2020-04-18 DIAGNOSIS — J309 Allergic rhinitis, unspecified: Secondary | ICD-10-CM | POA: Diagnosis not present

## 2020-04-18 DIAGNOSIS — L299 Pruritus, unspecified: Secondary | ICD-10-CM | POA: Diagnosis not present

## 2020-04-19 DIAGNOSIS — I639 Cerebral infarction, unspecified: Secondary | ICD-10-CM | POA: Diagnosis not present

## 2020-04-19 DIAGNOSIS — M6281 Muscle weakness (generalized): Secondary | ICD-10-CM | POA: Diagnosis not present

## 2020-04-19 DIAGNOSIS — N390 Urinary tract infection, site not specified: Secondary | ICD-10-CM | POA: Diagnosis not present

## 2020-04-19 DIAGNOSIS — R2681 Unsteadiness on feet: Secondary | ICD-10-CM | POA: Diagnosis not present

## 2020-04-19 DIAGNOSIS — R279 Unspecified lack of coordination: Secondary | ICD-10-CM | POA: Diagnosis not present

## 2020-04-19 DIAGNOSIS — R2689 Other abnormalities of gait and mobility: Secondary | ICD-10-CM | POA: Diagnosis not present

## 2020-04-19 DIAGNOSIS — R262 Difficulty in walking, not elsewhere classified: Secondary | ICD-10-CM | POA: Diagnosis not present

## 2020-04-20 DIAGNOSIS — R279 Unspecified lack of coordination: Secondary | ICD-10-CM | POA: Diagnosis not present

## 2020-04-20 DIAGNOSIS — R262 Difficulty in walking, not elsewhere classified: Secondary | ICD-10-CM | POA: Diagnosis not present

## 2020-04-20 DIAGNOSIS — R2689 Other abnormalities of gait and mobility: Secondary | ICD-10-CM | POA: Diagnosis not present

## 2020-04-20 DIAGNOSIS — R2681 Unsteadiness on feet: Secondary | ICD-10-CM | POA: Diagnosis not present

## 2020-04-20 DIAGNOSIS — M6281 Muscle weakness (generalized): Secondary | ICD-10-CM | POA: Diagnosis not present

## 2020-04-20 DIAGNOSIS — I639 Cerebral infarction, unspecified: Secondary | ICD-10-CM | POA: Diagnosis not present

## 2020-04-20 DIAGNOSIS — N390 Urinary tract infection, site not specified: Secondary | ICD-10-CM | POA: Diagnosis not present

## 2020-04-21 DIAGNOSIS — R262 Difficulty in walking, not elsewhere classified: Secondary | ICD-10-CM | POA: Diagnosis not present

## 2020-04-21 DIAGNOSIS — I639 Cerebral infarction, unspecified: Secondary | ICD-10-CM | POA: Diagnosis not present

## 2020-04-21 DIAGNOSIS — R2689 Other abnormalities of gait and mobility: Secondary | ICD-10-CM | POA: Diagnosis not present

## 2020-04-21 DIAGNOSIS — R2681 Unsteadiness on feet: Secondary | ICD-10-CM | POA: Diagnosis not present

## 2020-04-21 DIAGNOSIS — M6281 Muscle weakness (generalized): Secondary | ICD-10-CM | POA: Diagnosis not present

## 2020-04-21 DIAGNOSIS — R279 Unspecified lack of coordination: Secondary | ICD-10-CM | POA: Diagnosis not present

## 2020-04-21 DIAGNOSIS — N390 Urinary tract infection, site not specified: Secondary | ICD-10-CM | POA: Diagnosis not present

## 2020-04-22 DIAGNOSIS — M6281 Muscle weakness (generalized): Secondary | ICD-10-CM | POA: Diagnosis not present

## 2020-04-22 DIAGNOSIS — I639 Cerebral infarction, unspecified: Secondary | ICD-10-CM | POA: Diagnosis not present

## 2020-04-22 DIAGNOSIS — R279 Unspecified lack of coordination: Secondary | ICD-10-CM | POA: Diagnosis not present

## 2020-04-22 DIAGNOSIS — R262 Difficulty in walking, not elsewhere classified: Secondary | ICD-10-CM | POA: Diagnosis not present

## 2020-04-22 DIAGNOSIS — N390 Urinary tract infection, site not specified: Secondary | ICD-10-CM | POA: Diagnosis not present

## 2020-04-22 DIAGNOSIS — R2689 Other abnormalities of gait and mobility: Secondary | ICD-10-CM | POA: Diagnosis not present

## 2020-04-22 DIAGNOSIS — R2681 Unsteadiness on feet: Secondary | ICD-10-CM | POA: Diagnosis not present

## 2020-04-23 DIAGNOSIS — R2681 Unsteadiness on feet: Secondary | ICD-10-CM | POA: Diagnosis not present

## 2020-04-23 DIAGNOSIS — L299 Pruritus, unspecified: Secondary | ICD-10-CM | POA: Diagnosis not present

## 2020-04-23 DIAGNOSIS — N390 Urinary tract infection, site not specified: Secondary | ICD-10-CM | POA: Diagnosis not present

## 2020-04-23 DIAGNOSIS — I639 Cerebral infarction, unspecified: Secondary | ICD-10-CM | POA: Diagnosis not present

## 2020-04-23 DIAGNOSIS — Z79899 Other long term (current) drug therapy: Secondary | ICD-10-CM | POA: Diagnosis not present

## 2020-04-23 DIAGNOSIS — M6281 Muscle weakness (generalized): Secondary | ICD-10-CM | POA: Diagnosis not present

## 2020-04-23 DIAGNOSIS — R262 Difficulty in walking, not elsewhere classified: Secondary | ICD-10-CM | POA: Diagnosis not present

## 2020-04-23 DIAGNOSIS — R279 Unspecified lack of coordination: Secondary | ICD-10-CM | POA: Diagnosis not present

## 2020-04-23 DIAGNOSIS — R2689 Other abnormalities of gait and mobility: Secondary | ICD-10-CM | POA: Diagnosis not present

## 2020-04-24 DIAGNOSIS — R2681 Unsteadiness on feet: Secondary | ICD-10-CM | POA: Diagnosis not present

## 2020-04-24 DIAGNOSIS — R262 Difficulty in walking, not elsewhere classified: Secondary | ICD-10-CM | POA: Diagnosis not present

## 2020-04-24 DIAGNOSIS — I639 Cerebral infarction, unspecified: Secondary | ICD-10-CM | POA: Diagnosis not present

## 2020-04-24 DIAGNOSIS — R2689 Other abnormalities of gait and mobility: Secondary | ICD-10-CM | POA: Diagnosis not present

## 2020-04-24 DIAGNOSIS — M6281 Muscle weakness (generalized): Secondary | ICD-10-CM | POA: Diagnosis not present

## 2020-04-24 DIAGNOSIS — N390 Urinary tract infection, site not specified: Secondary | ICD-10-CM | POA: Diagnosis not present

## 2020-04-24 DIAGNOSIS — R279 Unspecified lack of coordination: Secondary | ICD-10-CM | POA: Diagnosis not present

## 2020-04-25 DIAGNOSIS — I639 Cerebral infarction, unspecified: Secondary | ICD-10-CM | POA: Diagnosis not present

## 2020-04-25 DIAGNOSIS — R279 Unspecified lack of coordination: Secondary | ICD-10-CM | POA: Diagnosis not present

## 2020-04-25 DIAGNOSIS — R262 Difficulty in walking, not elsewhere classified: Secondary | ICD-10-CM | POA: Diagnosis not present

## 2020-04-25 DIAGNOSIS — R2681 Unsteadiness on feet: Secondary | ICD-10-CM | POA: Diagnosis not present

## 2020-04-25 DIAGNOSIS — R2689 Other abnormalities of gait and mobility: Secondary | ICD-10-CM | POA: Diagnosis not present

## 2020-04-25 DIAGNOSIS — N390 Urinary tract infection, site not specified: Secondary | ICD-10-CM | POA: Diagnosis not present

## 2020-04-25 DIAGNOSIS — M6281 Muscle weakness (generalized): Secondary | ICD-10-CM | POA: Diagnosis not present

## 2020-04-28 DIAGNOSIS — R279 Unspecified lack of coordination: Secondary | ICD-10-CM | POA: Diagnosis not present

## 2020-04-28 DIAGNOSIS — I639 Cerebral infarction, unspecified: Secondary | ICD-10-CM | POA: Diagnosis not present

## 2020-04-28 DIAGNOSIS — R2681 Unsteadiness on feet: Secondary | ICD-10-CM | POA: Diagnosis not present

## 2020-04-28 DIAGNOSIS — R2689 Other abnormalities of gait and mobility: Secondary | ICD-10-CM | POA: Diagnosis not present

## 2020-04-28 DIAGNOSIS — R262 Difficulty in walking, not elsewhere classified: Secondary | ICD-10-CM | POA: Diagnosis not present

## 2020-04-28 DIAGNOSIS — N390 Urinary tract infection, site not specified: Secondary | ICD-10-CM | POA: Diagnosis not present

## 2020-04-28 DIAGNOSIS — M6281 Muscle weakness (generalized): Secondary | ICD-10-CM | POA: Diagnosis not present

## 2020-04-29 DIAGNOSIS — R279 Unspecified lack of coordination: Secondary | ICD-10-CM | POA: Diagnosis not present

## 2020-04-29 DIAGNOSIS — R2681 Unsteadiness on feet: Secondary | ICD-10-CM | POA: Diagnosis not present

## 2020-04-29 DIAGNOSIS — R262 Difficulty in walking, not elsewhere classified: Secondary | ICD-10-CM | POA: Diagnosis not present

## 2020-04-29 DIAGNOSIS — R2689 Other abnormalities of gait and mobility: Secondary | ICD-10-CM | POA: Diagnosis not present

## 2020-04-29 DIAGNOSIS — N390 Urinary tract infection, site not specified: Secondary | ICD-10-CM | POA: Diagnosis not present

## 2020-04-29 DIAGNOSIS — M6281 Muscle weakness (generalized): Secondary | ICD-10-CM | POA: Diagnosis not present

## 2020-04-29 DIAGNOSIS — I639 Cerebral infarction, unspecified: Secondary | ICD-10-CM | POA: Diagnosis not present

## 2020-04-30 DIAGNOSIS — I639 Cerebral infarction, unspecified: Secondary | ICD-10-CM | POA: Diagnosis not present

## 2020-04-30 DIAGNOSIS — R279 Unspecified lack of coordination: Secondary | ICD-10-CM | POA: Diagnosis not present

## 2020-04-30 DIAGNOSIS — R2689 Other abnormalities of gait and mobility: Secondary | ICD-10-CM | POA: Diagnosis not present

## 2020-04-30 DIAGNOSIS — M6281 Muscle weakness (generalized): Secondary | ICD-10-CM | POA: Diagnosis not present

## 2020-04-30 DIAGNOSIS — N390 Urinary tract infection, site not specified: Secondary | ICD-10-CM | POA: Diagnosis not present

## 2020-04-30 DIAGNOSIS — R262 Difficulty in walking, not elsewhere classified: Secondary | ICD-10-CM | POA: Diagnosis not present

## 2020-04-30 DIAGNOSIS — R2681 Unsteadiness on feet: Secondary | ICD-10-CM | POA: Diagnosis not present

## 2020-05-01 DIAGNOSIS — R2689 Other abnormalities of gait and mobility: Secondary | ICD-10-CM | POA: Diagnosis not present

## 2020-05-01 DIAGNOSIS — R279 Unspecified lack of coordination: Secondary | ICD-10-CM | POA: Diagnosis not present

## 2020-05-01 DIAGNOSIS — R2681 Unsteadiness on feet: Secondary | ICD-10-CM | POA: Diagnosis not present

## 2020-05-01 DIAGNOSIS — R262 Difficulty in walking, not elsewhere classified: Secondary | ICD-10-CM | POA: Diagnosis not present

## 2020-05-01 DIAGNOSIS — N390 Urinary tract infection, site not specified: Secondary | ICD-10-CM | POA: Diagnosis not present

## 2020-05-01 DIAGNOSIS — M6281 Muscle weakness (generalized): Secondary | ICD-10-CM | POA: Diagnosis not present

## 2020-05-01 DIAGNOSIS — I639 Cerebral infarction, unspecified: Secondary | ICD-10-CM | POA: Diagnosis not present

## 2020-05-02 DIAGNOSIS — R410 Disorientation, unspecified: Secondary | ICD-10-CM | POA: Diagnosis not present

## 2020-05-02 DIAGNOSIS — R262 Difficulty in walking, not elsewhere classified: Secondary | ICD-10-CM | POA: Diagnosis not present

## 2020-05-02 DIAGNOSIS — I82412 Acute embolism and thrombosis of left femoral vein: Secondary | ICD-10-CM | POA: Diagnosis not present

## 2020-05-02 DIAGNOSIS — R600 Localized edema: Secondary | ICD-10-CM | POA: Diagnosis not present

## 2020-05-02 DIAGNOSIS — R279 Unspecified lack of coordination: Secondary | ICD-10-CM | POA: Diagnosis not present

## 2020-05-02 DIAGNOSIS — I639 Cerebral infarction, unspecified: Secondary | ICD-10-CM | POA: Diagnosis not present

## 2020-05-02 DIAGNOSIS — R2689 Other abnormalities of gait and mobility: Secondary | ICD-10-CM | POA: Diagnosis not present

## 2020-05-02 DIAGNOSIS — M6281 Muscle weakness (generalized): Secondary | ICD-10-CM | POA: Diagnosis not present

## 2020-05-02 DIAGNOSIS — R2681 Unsteadiness on feet: Secondary | ICD-10-CM | POA: Diagnosis not present

## 2020-05-02 DIAGNOSIS — N390 Urinary tract infection, site not specified: Secondary | ICD-10-CM | POA: Diagnosis not present

## 2020-05-03 DIAGNOSIS — R279 Unspecified lack of coordination: Secondary | ICD-10-CM | POA: Diagnosis not present

## 2020-05-03 DIAGNOSIS — R2689 Other abnormalities of gait and mobility: Secondary | ICD-10-CM | POA: Diagnosis not present

## 2020-05-03 DIAGNOSIS — N390 Urinary tract infection, site not specified: Secondary | ICD-10-CM | POA: Diagnosis not present

## 2020-05-03 DIAGNOSIS — I639 Cerebral infarction, unspecified: Secondary | ICD-10-CM | POA: Diagnosis not present

## 2020-05-03 DIAGNOSIS — M6281 Muscle weakness (generalized): Secondary | ICD-10-CM | POA: Diagnosis not present

## 2020-05-03 DIAGNOSIS — R262 Difficulty in walking, not elsewhere classified: Secondary | ICD-10-CM | POA: Diagnosis not present

## 2020-05-03 DIAGNOSIS — R2681 Unsteadiness on feet: Secondary | ICD-10-CM | POA: Diagnosis not present

## 2020-05-04 DIAGNOSIS — M6281 Muscle weakness (generalized): Secondary | ICD-10-CM | POA: Diagnosis not present

## 2020-05-04 DIAGNOSIS — R2689 Other abnormalities of gait and mobility: Secondary | ICD-10-CM | POA: Diagnosis not present

## 2020-05-04 DIAGNOSIS — N390 Urinary tract infection, site not specified: Secondary | ICD-10-CM | POA: Diagnosis not present

## 2020-05-04 DIAGNOSIS — I639 Cerebral infarction, unspecified: Secondary | ICD-10-CM | POA: Diagnosis not present

## 2020-05-04 DIAGNOSIS — R634 Abnormal weight loss: Secondary | ICD-10-CM | POA: Diagnosis not present

## 2020-05-04 DIAGNOSIS — R279 Unspecified lack of coordination: Secondary | ICD-10-CM | POA: Diagnosis not present

## 2020-05-04 DIAGNOSIS — R262 Difficulty in walking, not elsewhere classified: Secondary | ICD-10-CM | POA: Diagnosis not present

## 2020-05-04 DIAGNOSIS — R2681 Unsteadiness on feet: Secondary | ICD-10-CM | POA: Diagnosis not present

## 2020-05-05 DIAGNOSIS — M6281 Muscle weakness (generalized): Secondary | ICD-10-CM | POA: Diagnosis not present

## 2020-05-05 DIAGNOSIS — R2689 Other abnormalities of gait and mobility: Secondary | ICD-10-CM | POA: Diagnosis not present

## 2020-05-05 DIAGNOSIS — R634 Abnormal weight loss: Secondary | ICD-10-CM | POA: Diagnosis not present

## 2020-05-05 DIAGNOSIS — R2681 Unsteadiness on feet: Secondary | ICD-10-CM | POA: Diagnosis not present

## 2020-05-05 DIAGNOSIS — I639 Cerebral infarction, unspecified: Secondary | ICD-10-CM | POA: Diagnosis not present

## 2020-05-05 DIAGNOSIS — R262 Difficulty in walking, not elsewhere classified: Secondary | ICD-10-CM | POA: Diagnosis not present

## 2020-05-05 DIAGNOSIS — N390 Urinary tract infection, site not specified: Secondary | ICD-10-CM | POA: Diagnosis not present

## 2020-05-05 DIAGNOSIS — R279 Unspecified lack of coordination: Secondary | ICD-10-CM | POA: Diagnosis not present

## 2020-05-06 DIAGNOSIS — M6281 Muscle weakness (generalized): Secondary | ICD-10-CM | POA: Diagnosis not present

## 2020-05-06 DIAGNOSIS — I639 Cerebral infarction, unspecified: Secondary | ICD-10-CM | POA: Diagnosis not present

## 2020-05-06 DIAGNOSIS — R262 Difficulty in walking, not elsewhere classified: Secondary | ICD-10-CM | POA: Diagnosis not present

## 2020-05-06 DIAGNOSIS — N390 Urinary tract infection, site not specified: Secondary | ICD-10-CM | POA: Diagnosis not present

## 2020-05-06 DIAGNOSIS — R2689 Other abnormalities of gait and mobility: Secondary | ICD-10-CM | POA: Diagnosis not present

## 2020-05-06 DIAGNOSIS — R634 Abnormal weight loss: Secondary | ICD-10-CM | POA: Diagnosis not present

## 2020-05-06 DIAGNOSIS — R279 Unspecified lack of coordination: Secondary | ICD-10-CM | POA: Diagnosis not present

## 2020-05-06 DIAGNOSIS — R2681 Unsteadiness on feet: Secondary | ICD-10-CM | POA: Diagnosis not present

## 2020-05-07 ENCOUNTER — Non-Acute Institutional Stay: Payer: Medicare Other | Admitting: Adult Health Nurse Practitioner

## 2020-05-07 ENCOUNTER — Other Ambulatory Visit: Payer: Self-pay

## 2020-05-07 DIAGNOSIS — N390 Urinary tract infection, site not specified: Secondary | ICD-10-CM | POA: Diagnosis not present

## 2020-05-07 DIAGNOSIS — R2681 Unsteadiness on feet: Secondary | ICD-10-CM | POA: Diagnosis not present

## 2020-05-07 DIAGNOSIS — U071 COVID-19: Secondary | ICD-10-CM

## 2020-05-07 DIAGNOSIS — R2689 Other abnormalities of gait and mobility: Secondary | ICD-10-CM | POA: Diagnosis not present

## 2020-05-07 DIAGNOSIS — R279 Unspecified lack of coordination: Secondary | ICD-10-CM | POA: Diagnosis not present

## 2020-05-07 DIAGNOSIS — I639 Cerebral infarction, unspecified: Secondary | ICD-10-CM | POA: Diagnosis not present

## 2020-05-07 DIAGNOSIS — Z515 Encounter for palliative care: Secondary | ICD-10-CM

## 2020-05-07 DIAGNOSIS — R262 Difficulty in walking, not elsewhere classified: Secondary | ICD-10-CM | POA: Diagnosis not present

## 2020-05-07 DIAGNOSIS — R634 Abnormal weight loss: Secondary | ICD-10-CM | POA: Diagnosis not present

## 2020-05-07 DIAGNOSIS — M6281 Muscle weakness (generalized): Secondary | ICD-10-CM | POA: Diagnosis not present

## 2020-05-07 NOTE — Progress Notes (Signed)
[facility] Consult Note Telephone: [phone]  Fax: [phone]  PATIENT NAME: Lori Berger DOB: Feb 22, 1941 MRN: [id number]  PRIMARY CARE PROVIDER:   Jerrol Banana., MD  REFERRING PROVIDER:  Dr. Warren Lacy Rosenthal/ Cletus Gash NP  RESPONSIBLE PARTY:   Dexter Sauser, son H: [phone] C: [phone] tien aispuro, daughter-in-law [phone]    RECOMMENDATIONS and PLAN:  1.  Advanced care planning.  Patient is DNR/comfort measures.  Spoke with son and daughter-in-law to update on visit.  2.  Cognitive impairment.  Patient has forgetfulness and confusion.  She is alert and oriented to person and place.  Daughter-in-law does state that she has had a decline in her memory since her stroke 2 years ago.  But has noticed a quicker decline in her memory since having Covid earlier this year.  Daughter-in-law does state that at times she will have visual hallucinations that include seeing bears outside her window.  Daughter-in-law also states that speech therapist at facility has started working with her again due to worsening memory loss.  Continue working with ST as ordered and supportive care at facility  3.  Functional status.  Patient is wheelchair-bound.  She is able to walk short distances with a walker.  She does not walk with a walker unassisted.  No reported falls.  Does require assistance with ADLs but is able to feed herself.  Patient's appetite has improved and her weight is 145.4 pounds which is up 8 pounds from 2 months ago.  Patient is incontinent of bowel and bladder.  Continue supportive care at facility.  Patient is currently being treated for UTI.  No hospital visits since last visit.  Patient does complain of pain in left knee when walking and gets relief when sitting back down and with Tylenol.  Has stable DOE.  Denies fever, increased shortness of breath or cough, N/V/D, constipation.  Daughter-in-law does state that since  having Covid physically she has gotten stronger but her memory continues to decline.  Palliative will continue to monitor for symptom management/decline and make recommendations as needed.  We will follow up in 6 to 8 weeks.  Family encouraged to call with any concerns or questions.  I spent 40 minutes providing this consultation,  from 10:10 to 10:50 including time spent with patient/family, chart review, provider coordination, documentation. More than 50% of the time in this consultation was spent coordinating communication.   HISTORY OF PRESENT ILLNESS:  Lori Berger is a 79 y.o. year old female with multiple medical problems including dementia, DMT2, HTN, HLD, depression, frequent falls, recurrent UTIs, h/o CVA. Palliative Care was asked to help address goals of care.   CODE STATUS: DNR/comfort measures  PPS: 30% HOSPICE ELIGIBILITY/DIAGNOSIS: TBD  PHYSICAL EXAM:  HR 71 O2 99% on RA General: NAD, frail appearing, thin Cardiovascular: regular rate and rhythm Pulmonary: lung sounds clear; normal respiratory effort Abdomen: soft, nontender, + bowel sounds GU: no suprapubic tenderness Extremities: no edema, no joint deformities Skin: no rashes on exposed skin Neurological: Weakness; A&O to person and place  PAST MEDICAL HISTORY:  Past Medical History:  Diagnosis Date  . Arthritis   . BCC (basal cell carcinoma)   . Breast cancer ([place]) 1990   left 1990  . Breast cancer ([place]) 11/11/2015   Right, T2 (2.4 cm(, N0; positive deep margin, ER: 100%, PR: 60%; Her 2 neu not amplified, Mammoprint: low risk  . Diabetes mellitus without complication ([place])   . Endometrial cancer (  [place])   . Hyperlipidemia   . Hypertension   . Major depressive disorder   . PONV (postoperative nausea and vomiting)   . UTI (urinary tract infection)   . Yeast infection     SOCIAL HX:  Social History   Tobacco Use  . Smoking status: Never Smoker  . Smokeless tobacco: Never Used  Substance Use Topics  .  Alcohol use: No    ALLERGIES:  Allergies  Allergen Reactions  . Penicillins      PERTINENT MEDICATIONS:  Outpatient Encounter Medications as of 05/07/2020  Medication Sig  . ascorbic acid (VITAMIN C) 500 MG tablet Take 1 tablet (500 mg total) by mouth daily.  Marland Kitchen aspirin EC 81 MG tablet Take 1 tablet (81 mg total) by mouth daily.  . bisacodyl (DULCOLAX) 5 MG EC tablet Take 1 tablet (5 mg total) by mouth daily as needed for moderate constipation. (Patient not taking: Reported on 01/28/2020)  . Calcium-Vitamin D-Vitamin K 893-810-17 MG-UNT-MCG TABS Take 1 tablet by mouth daily.  . Canagliflozin-metFORMIN HCl (INVOKAMET) 150-500 MG TABS Take 1 tablet by mouth 2 (two) times daily.  . clopidogrel (PLAVIX) 75 MG tablet TAKE 1 TABLET BY MOUTH  DAILY (Patient taking differently: Take 75 mg by mouth daily. )  . ELIQUIS 5 MG TABS tablet Take 5 mg by mouth 2 (two) times daily.  Marland Kitchen escitalopram (LEXAPRO) 10 MG tablet Take 10 mg by mouth daily.  . fluconazole (DIFLUCAN) 200 MG tablet Take 200 mg by mouth daily.  Marland Kitchen glucose blood test strip Check sugar daily  . guaiFENesin-dextromethorphan (ROBITUSSIN DM) 100-10 MG/5ML syrup Take 10 mLs by mouth every 4 (four) hours as needed for cough.  . insulin aspart (NOVOLOG) 100 UNIT/ML injection Inject 6 Units into the skin 3 (three) times daily with meals for 5 days.  . Ipratropium-Albuterol (COMBIVENT) 20-100 MCG/ACT AERS respimat Inhale 1 puff into the lungs every 6 (six) hours. (Patient not taking: Reported on 01/28/2020)  . letrozole (FEMARA) 2.5 MG tablet TAKE 1 TABLET BY MOUTH  DAILY (Patient taking differently: Take 2.5 mg by mouth daily. )  . mirtazapine (REMERON) 15 MG tablet Take 1 tablet (15 mg total) by mouth at bedtime.  . Multiple Vitamin (MULTIVITAMIN WITH MINERALS) TABS tablet Take 1 tablet by mouth daily. (Patient not taking: Reported on 01/28/2020)  . Nutritional Supplements (FEEDING SUPPLEMENT, NEPRO CARB STEADY,) LIQD Take 237 mLs by mouth 3 (three)  times daily between meals.  . nystatin cream (MYCOSTATIN) Apply 1 application topically 2 (two) times daily. (Patient not taking: Reported on 01/28/2020)  . ondansetron (ZOFRAN) 4 MG tablet Take 1 tablet (4 mg total) by mouth every 6 (six) hours as needed for nausea. (Patient not taking: Reported on 01/28/2020)  . simvastatin (ZOCOR) 10 MG tablet TAKE 1 TABLET BY MOUTH AT  BEDTIME (Patient taking differently: Take 10 mg by mouth at bedtime. )  . traZODone (DESYREL) 50 MG tablet Take 0.5 tablets (25 mg total) by mouth at bedtime as needed for sleep.  Marland Kitchen zinc sulfate 220 (50 Zn) MG capsule Take 1 capsule (220 mg total) by mouth daily. (Patient not taking: Reported on 01/28/2020)   No facility-administered encounter medications on file as of 05/07/2020.      Rebeca Valdivia Jenetta Downer, NP

## 2020-05-08 DIAGNOSIS — R262 Difficulty in walking, not elsewhere classified: Secondary | ICD-10-CM | POA: Diagnosis not present

## 2020-05-08 DIAGNOSIS — R2689 Other abnormalities of gait and mobility: Secondary | ICD-10-CM | POA: Diagnosis not present

## 2020-05-08 DIAGNOSIS — N390 Urinary tract infection, site not specified: Secondary | ICD-10-CM | POA: Diagnosis not present

## 2020-05-08 DIAGNOSIS — R634 Abnormal weight loss: Secondary | ICD-10-CM | POA: Diagnosis not present

## 2020-05-08 DIAGNOSIS — I639 Cerebral infarction, unspecified: Secondary | ICD-10-CM | POA: Diagnosis not present

## 2020-05-08 DIAGNOSIS — R279 Unspecified lack of coordination: Secondary | ICD-10-CM | POA: Diagnosis not present

## 2020-05-08 DIAGNOSIS — R2681 Unsteadiness on feet: Secondary | ICD-10-CM | POA: Diagnosis not present

## 2020-05-08 DIAGNOSIS — M6281 Muscle weakness (generalized): Secondary | ICD-10-CM | POA: Diagnosis not present

## 2020-05-09 DIAGNOSIS — R634 Abnormal weight loss: Secondary | ICD-10-CM | POA: Diagnosis not present

## 2020-05-09 DIAGNOSIS — I639 Cerebral infarction, unspecified: Secondary | ICD-10-CM | POA: Diagnosis not present

## 2020-05-09 DIAGNOSIS — R279 Unspecified lack of coordination: Secondary | ICD-10-CM | POA: Diagnosis not present

## 2020-05-09 DIAGNOSIS — Z03818 Encounter for observation for suspected exposure to other biological agents ruled out: Secondary | ICD-10-CM | POA: Diagnosis not present

## 2020-05-09 DIAGNOSIS — M6281 Muscle weakness (generalized): Secondary | ICD-10-CM | POA: Diagnosis not present

## 2020-05-09 DIAGNOSIS — R262 Difficulty in walking, not elsewhere classified: Secondary | ICD-10-CM | POA: Diagnosis not present

## 2020-05-09 DIAGNOSIS — N390 Urinary tract infection, site not specified: Secondary | ICD-10-CM | POA: Diagnosis not present

## 2020-05-09 DIAGNOSIS — R2689 Other abnormalities of gait and mobility: Secondary | ICD-10-CM | POA: Diagnosis not present

## 2020-05-09 DIAGNOSIS — R2681 Unsteadiness on feet: Secondary | ICD-10-CM | POA: Diagnosis not present

## 2020-05-10 DIAGNOSIS — M6281 Muscle weakness (generalized): Secondary | ICD-10-CM | POA: Diagnosis not present

## 2020-05-10 DIAGNOSIS — I639 Cerebral infarction, unspecified: Secondary | ICD-10-CM | POA: Diagnosis not present

## 2020-05-10 DIAGNOSIS — R2689 Other abnormalities of gait and mobility: Secondary | ICD-10-CM | POA: Diagnosis not present

## 2020-05-10 DIAGNOSIS — R262 Difficulty in walking, not elsewhere classified: Secondary | ICD-10-CM | POA: Diagnosis not present

## 2020-05-10 DIAGNOSIS — R634 Abnormal weight loss: Secondary | ICD-10-CM | POA: Diagnosis not present

## 2020-05-10 DIAGNOSIS — R2681 Unsteadiness on feet: Secondary | ICD-10-CM | POA: Diagnosis not present

## 2020-05-10 DIAGNOSIS — R279 Unspecified lack of coordination: Secondary | ICD-10-CM | POA: Diagnosis not present

## 2020-05-10 DIAGNOSIS — N390 Urinary tract infection, site not specified: Secondary | ICD-10-CM | POA: Diagnosis not present

## 2020-05-11 DIAGNOSIS — N390 Urinary tract infection, site not specified: Secondary | ICD-10-CM | POA: Diagnosis not present

## 2020-05-11 DIAGNOSIS — R634 Abnormal weight loss: Secondary | ICD-10-CM | POA: Diagnosis not present

## 2020-05-11 DIAGNOSIS — R2689 Other abnormalities of gait and mobility: Secondary | ICD-10-CM | POA: Diagnosis not present

## 2020-05-11 DIAGNOSIS — R2681 Unsteadiness on feet: Secondary | ICD-10-CM | POA: Diagnosis not present

## 2020-05-11 DIAGNOSIS — R262 Difficulty in walking, not elsewhere classified: Secondary | ICD-10-CM | POA: Diagnosis not present

## 2020-05-11 DIAGNOSIS — I639 Cerebral infarction, unspecified: Secondary | ICD-10-CM | POA: Diagnosis not present

## 2020-05-11 DIAGNOSIS — R279 Unspecified lack of coordination: Secondary | ICD-10-CM | POA: Diagnosis not present

## 2020-05-11 DIAGNOSIS — M6281 Muscle weakness (generalized): Secondary | ICD-10-CM | POA: Diagnosis not present

## 2020-05-12 DIAGNOSIS — R2689 Other abnormalities of gait and mobility: Secondary | ICD-10-CM | POA: Diagnosis not present

## 2020-05-12 DIAGNOSIS — R2681 Unsteadiness on feet: Secondary | ICD-10-CM | POA: Diagnosis not present

## 2020-05-12 DIAGNOSIS — R279 Unspecified lack of coordination: Secondary | ICD-10-CM | POA: Diagnosis not present

## 2020-05-12 DIAGNOSIS — M6281 Muscle weakness (generalized): Secondary | ICD-10-CM | POA: Diagnosis not present

## 2020-05-12 DIAGNOSIS — R634 Abnormal weight loss: Secondary | ICD-10-CM | POA: Diagnosis not present

## 2020-05-12 DIAGNOSIS — N390 Urinary tract infection, site not specified: Secondary | ICD-10-CM | POA: Diagnosis not present

## 2020-05-12 DIAGNOSIS — R262 Difficulty in walking, not elsewhere classified: Secondary | ICD-10-CM | POA: Diagnosis not present

## 2020-05-12 DIAGNOSIS — I639 Cerebral infarction, unspecified: Secondary | ICD-10-CM | POA: Diagnosis not present

## 2020-05-13 DIAGNOSIS — R279 Unspecified lack of coordination: Secondary | ICD-10-CM | POA: Diagnosis not present

## 2020-05-13 DIAGNOSIS — I639 Cerebral infarction, unspecified: Secondary | ICD-10-CM | POA: Diagnosis not present

## 2020-05-13 DIAGNOSIS — N390 Urinary tract infection, site not specified: Secondary | ICD-10-CM | POA: Diagnosis not present

## 2020-05-13 DIAGNOSIS — R262 Difficulty in walking, not elsewhere classified: Secondary | ICD-10-CM | POA: Diagnosis not present

## 2020-05-13 DIAGNOSIS — R634 Abnormal weight loss: Secondary | ICD-10-CM | POA: Diagnosis not present

## 2020-05-13 DIAGNOSIS — R2681 Unsteadiness on feet: Secondary | ICD-10-CM | POA: Diagnosis not present

## 2020-05-13 DIAGNOSIS — R2689 Other abnormalities of gait and mobility: Secondary | ICD-10-CM | POA: Diagnosis not present

## 2020-05-13 DIAGNOSIS — M6281 Muscle weakness (generalized): Secondary | ICD-10-CM | POA: Diagnosis not present

## 2020-05-14 DIAGNOSIS — R279 Unspecified lack of coordination: Secondary | ICD-10-CM | POA: Diagnosis not present

## 2020-05-14 DIAGNOSIS — R262 Difficulty in walking, not elsewhere classified: Secondary | ICD-10-CM | POA: Diagnosis not present

## 2020-05-14 DIAGNOSIS — R2681 Unsteadiness on feet: Secondary | ICD-10-CM | POA: Diagnosis not present

## 2020-05-14 DIAGNOSIS — R2689 Other abnormalities of gait and mobility: Secondary | ICD-10-CM | POA: Diagnosis not present

## 2020-05-14 DIAGNOSIS — I639 Cerebral infarction, unspecified: Secondary | ICD-10-CM | POA: Diagnosis not present

## 2020-05-14 DIAGNOSIS — N390 Urinary tract infection, site not specified: Secondary | ICD-10-CM | POA: Diagnosis not present

## 2020-05-14 DIAGNOSIS — M6281 Muscle weakness (generalized): Secondary | ICD-10-CM | POA: Diagnosis not present

## 2020-05-14 DIAGNOSIS — R634 Abnormal weight loss: Secondary | ICD-10-CM | POA: Diagnosis not present

## 2020-05-15 DIAGNOSIS — R2681 Unsteadiness on feet: Secondary | ICD-10-CM | POA: Diagnosis not present

## 2020-05-15 DIAGNOSIS — R634 Abnormal weight loss: Secondary | ICD-10-CM | POA: Diagnosis not present

## 2020-05-15 DIAGNOSIS — N390 Urinary tract infection, site not specified: Secondary | ICD-10-CM | POA: Diagnosis not present

## 2020-05-15 DIAGNOSIS — I639 Cerebral infarction, unspecified: Secondary | ICD-10-CM | POA: Diagnosis not present

## 2020-05-15 DIAGNOSIS — R262 Difficulty in walking, not elsewhere classified: Secondary | ICD-10-CM | POA: Diagnosis not present

## 2020-05-15 DIAGNOSIS — M6281 Muscle weakness (generalized): Secondary | ICD-10-CM | POA: Diagnosis not present

## 2020-05-15 DIAGNOSIS — R279 Unspecified lack of coordination: Secondary | ICD-10-CM | POA: Diagnosis not present

## 2020-05-15 DIAGNOSIS — R2689 Other abnormalities of gait and mobility: Secondary | ICD-10-CM | POA: Diagnosis not present

## 2020-05-16 DIAGNOSIS — M6281 Muscle weakness (generalized): Secondary | ICD-10-CM | POA: Diagnosis not present

## 2020-05-16 DIAGNOSIS — R262 Difficulty in walking, not elsewhere classified: Secondary | ICD-10-CM | POA: Diagnosis not present

## 2020-05-16 DIAGNOSIS — N390 Urinary tract infection, site not specified: Secondary | ICD-10-CM | POA: Diagnosis not present

## 2020-05-16 DIAGNOSIS — R2689 Other abnormalities of gait and mobility: Secondary | ICD-10-CM | POA: Diagnosis not present

## 2020-05-16 DIAGNOSIS — R2681 Unsteadiness on feet: Secondary | ICD-10-CM | POA: Diagnosis not present

## 2020-05-16 DIAGNOSIS — R634 Abnormal weight loss: Secondary | ICD-10-CM | POA: Diagnosis not present

## 2020-05-16 DIAGNOSIS — R279 Unspecified lack of coordination: Secondary | ICD-10-CM | POA: Diagnosis not present

## 2020-05-16 DIAGNOSIS — I639 Cerebral infarction, unspecified: Secondary | ICD-10-CM | POA: Diagnosis not present

## 2020-05-16 DIAGNOSIS — Z03818 Encounter for observation for suspected exposure to other biological agents ruled out: Secondary | ICD-10-CM | POA: Diagnosis not present

## 2020-05-17 DIAGNOSIS — R634 Abnormal weight loss: Secondary | ICD-10-CM | POA: Diagnosis not present

## 2020-05-17 DIAGNOSIS — R2689 Other abnormalities of gait and mobility: Secondary | ICD-10-CM | POA: Diagnosis not present

## 2020-05-17 DIAGNOSIS — N390 Urinary tract infection, site not specified: Secondary | ICD-10-CM | POA: Diagnosis not present

## 2020-05-17 DIAGNOSIS — R2681 Unsteadiness on feet: Secondary | ICD-10-CM | POA: Diagnosis not present

## 2020-05-17 DIAGNOSIS — R279 Unspecified lack of coordination: Secondary | ICD-10-CM | POA: Diagnosis not present

## 2020-05-17 DIAGNOSIS — M6281 Muscle weakness (generalized): Secondary | ICD-10-CM | POA: Diagnosis not present

## 2020-05-17 DIAGNOSIS — I639 Cerebral infarction, unspecified: Secondary | ICD-10-CM | POA: Diagnosis not present

## 2020-05-17 DIAGNOSIS — R262 Difficulty in walking, not elsewhere classified: Secondary | ICD-10-CM | POA: Diagnosis not present

## 2020-05-18 DIAGNOSIS — R2681 Unsteadiness on feet: Secondary | ICD-10-CM | POA: Diagnosis not present

## 2020-05-18 DIAGNOSIS — N390 Urinary tract infection, site not specified: Secondary | ICD-10-CM | POA: Diagnosis not present

## 2020-05-18 DIAGNOSIS — R634 Abnormal weight loss: Secondary | ICD-10-CM | POA: Diagnosis not present

## 2020-05-18 DIAGNOSIS — R279 Unspecified lack of coordination: Secondary | ICD-10-CM | POA: Diagnosis not present

## 2020-05-18 DIAGNOSIS — I639 Cerebral infarction, unspecified: Secondary | ICD-10-CM | POA: Diagnosis not present

## 2020-05-18 DIAGNOSIS — R262 Difficulty in walking, not elsewhere classified: Secondary | ICD-10-CM | POA: Diagnosis not present

## 2020-05-18 DIAGNOSIS — R2689 Other abnormalities of gait and mobility: Secondary | ICD-10-CM | POA: Diagnosis not present

## 2020-05-18 DIAGNOSIS — M6281 Muscle weakness (generalized): Secondary | ICD-10-CM | POA: Diagnosis not present

## 2020-05-19 DIAGNOSIS — R279 Unspecified lack of coordination: Secondary | ICD-10-CM | POA: Diagnosis not present

## 2020-05-19 DIAGNOSIS — M6281 Muscle weakness (generalized): Secondary | ICD-10-CM | POA: Diagnosis not present

## 2020-05-19 DIAGNOSIS — R2689 Other abnormalities of gait and mobility: Secondary | ICD-10-CM | POA: Diagnosis not present

## 2020-05-19 DIAGNOSIS — R634 Abnormal weight loss: Secondary | ICD-10-CM | POA: Diagnosis not present

## 2020-05-19 DIAGNOSIS — I639 Cerebral infarction, unspecified: Secondary | ICD-10-CM | POA: Diagnosis not present

## 2020-05-19 DIAGNOSIS — R262 Difficulty in walking, not elsewhere classified: Secondary | ICD-10-CM | POA: Diagnosis not present

## 2020-05-19 DIAGNOSIS — N390 Urinary tract infection, site not specified: Secondary | ICD-10-CM | POA: Diagnosis not present

## 2020-05-19 DIAGNOSIS — R2681 Unsteadiness on feet: Secondary | ICD-10-CM | POA: Diagnosis not present

## 2020-05-20 DIAGNOSIS — R2689 Other abnormalities of gait and mobility: Secondary | ICD-10-CM | POA: Diagnosis not present

## 2020-05-20 DIAGNOSIS — R262 Difficulty in walking, not elsewhere classified: Secondary | ICD-10-CM | POA: Diagnosis not present

## 2020-05-20 DIAGNOSIS — R279 Unspecified lack of coordination: Secondary | ICD-10-CM | POA: Diagnosis not present

## 2020-05-20 DIAGNOSIS — I639 Cerebral infarction, unspecified: Secondary | ICD-10-CM | POA: Diagnosis not present

## 2020-05-20 DIAGNOSIS — R2681 Unsteadiness on feet: Secondary | ICD-10-CM | POA: Diagnosis not present

## 2020-05-20 DIAGNOSIS — N390 Urinary tract infection, site not specified: Secondary | ICD-10-CM | POA: Diagnosis not present

## 2020-05-20 DIAGNOSIS — M6281 Muscle weakness (generalized): Secondary | ICD-10-CM | POA: Diagnosis not present

## 2020-05-20 DIAGNOSIS — R634 Abnormal weight loss: Secondary | ICD-10-CM | POA: Diagnosis not present

## 2020-05-21 DIAGNOSIS — R2689 Other abnormalities of gait and mobility: Secondary | ICD-10-CM | POA: Diagnosis not present

## 2020-05-21 DIAGNOSIS — I639 Cerebral infarction, unspecified: Secondary | ICD-10-CM | POA: Diagnosis not present

## 2020-05-21 DIAGNOSIS — R634 Abnormal weight loss: Secondary | ICD-10-CM | POA: Diagnosis not present

## 2020-05-21 DIAGNOSIS — N390 Urinary tract infection, site not specified: Secondary | ICD-10-CM | POA: Diagnosis not present

## 2020-05-21 DIAGNOSIS — R279 Unspecified lack of coordination: Secondary | ICD-10-CM | POA: Diagnosis not present

## 2020-05-21 DIAGNOSIS — R2681 Unsteadiness on feet: Secondary | ICD-10-CM | POA: Diagnosis not present

## 2020-05-21 DIAGNOSIS — R262 Difficulty in walking, not elsewhere classified: Secondary | ICD-10-CM | POA: Diagnosis not present

## 2020-05-21 DIAGNOSIS — M6281 Muscle weakness (generalized): Secondary | ICD-10-CM | POA: Diagnosis not present

## 2020-05-22 DIAGNOSIS — R279 Unspecified lack of coordination: Secondary | ICD-10-CM | POA: Diagnosis not present

## 2020-05-22 DIAGNOSIS — R634 Abnormal weight loss: Secondary | ICD-10-CM | POA: Diagnosis not present

## 2020-05-22 DIAGNOSIS — I639 Cerebral infarction, unspecified: Secondary | ICD-10-CM | POA: Diagnosis not present

## 2020-05-22 DIAGNOSIS — R262 Difficulty in walking, not elsewhere classified: Secondary | ICD-10-CM | POA: Diagnosis not present

## 2020-05-22 DIAGNOSIS — R2689 Other abnormalities of gait and mobility: Secondary | ICD-10-CM | POA: Diagnosis not present

## 2020-05-22 DIAGNOSIS — R2681 Unsteadiness on feet: Secondary | ICD-10-CM | POA: Diagnosis not present

## 2020-05-22 DIAGNOSIS — N390 Urinary tract infection, site not specified: Secondary | ICD-10-CM | POA: Diagnosis not present

## 2020-05-22 DIAGNOSIS — M6281 Muscle weakness (generalized): Secondary | ICD-10-CM | POA: Diagnosis not present

## 2020-05-23 DIAGNOSIS — R634 Abnormal weight loss: Secondary | ICD-10-CM | POA: Diagnosis not present

## 2020-05-23 DIAGNOSIS — U071 COVID-19: Secondary | ICD-10-CM | POA: Diagnosis not present

## 2020-05-23 DIAGNOSIS — R2689 Other abnormalities of gait and mobility: Secondary | ICD-10-CM | POA: Diagnosis not present

## 2020-05-23 DIAGNOSIS — M6281 Muscle weakness (generalized): Secondary | ICD-10-CM | POA: Diagnosis not present

## 2020-05-23 DIAGNOSIS — R2681 Unsteadiness on feet: Secondary | ICD-10-CM | POA: Diagnosis not present

## 2020-05-23 DIAGNOSIS — R262 Difficulty in walking, not elsewhere classified: Secondary | ICD-10-CM | POA: Diagnosis not present

## 2020-05-23 DIAGNOSIS — N390 Urinary tract infection, site not specified: Secondary | ICD-10-CM | POA: Diagnosis not present

## 2020-05-23 DIAGNOSIS — R279 Unspecified lack of coordination: Secondary | ICD-10-CM | POA: Diagnosis not present

## 2020-05-23 DIAGNOSIS — I639 Cerebral infarction, unspecified: Secondary | ICD-10-CM | POA: Diagnosis not present

## 2020-05-24 DIAGNOSIS — I639 Cerebral infarction, unspecified: Secondary | ICD-10-CM | POA: Diagnosis not present

## 2020-05-24 DIAGNOSIS — N390 Urinary tract infection, site not specified: Secondary | ICD-10-CM | POA: Diagnosis not present

## 2020-05-24 DIAGNOSIS — R634 Abnormal weight loss: Secondary | ICD-10-CM | POA: Diagnosis not present

## 2020-05-24 DIAGNOSIS — M6281 Muscle weakness (generalized): Secondary | ICD-10-CM | POA: Diagnosis not present

## 2020-05-24 DIAGNOSIS — R262 Difficulty in walking, not elsewhere classified: Secondary | ICD-10-CM | POA: Diagnosis not present

## 2020-05-24 DIAGNOSIS — R279 Unspecified lack of coordination: Secondary | ICD-10-CM | POA: Diagnosis not present

## 2020-05-24 DIAGNOSIS — R2681 Unsteadiness on feet: Secondary | ICD-10-CM | POA: Diagnosis not present

## 2020-05-24 DIAGNOSIS — R2689 Other abnormalities of gait and mobility: Secondary | ICD-10-CM | POA: Diagnosis not present

## 2020-05-25 DIAGNOSIS — I639 Cerebral infarction, unspecified: Secondary | ICD-10-CM | POA: Diagnosis not present

## 2020-05-25 DIAGNOSIS — R634 Abnormal weight loss: Secondary | ICD-10-CM | POA: Diagnosis not present

## 2020-05-25 DIAGNOSIS — R2681 Unsteadiness on feet: Secondary | ICD-10-CM | POA: Diagnosis not present

## 2020-05-25 DIAGNOSIS — M6281 Muscle weakness (generalized): Secondary | ICD-10-CM | POA: Diagnosis not present

## 2020-05-25 DIAGNOSIS — R279 Unspecified lack of coordination: Secondary | ICD-10-CM | POA: Diagnosis not present

## 2020-05-25 DIAGNOSIS — R2689 Other abnormalities of gait and mobility: Secondary | ICD-10-CM | POA: Diagnosis not present

## 2020-05-25 DIAGNOSIS — R262 Difficulty in walking, not elsewhere classified: Secondary | ICD-10-CM | POA: Diagnosis not present

## 2020-05-25 DIAGNOSIS — N390 Urinary tract infection, site not specified: Secondary | ICD-10-CM | POA: Diagnosis not present

## 2020-05-26 DIAGNOSIS — R262 Difficulty in walking, not elsewhere classified: Secondary | ICD-10-CM | POA: Diagnosis not present

## 2020-05-26 DIAGNOSIS — R279 Unspecified lack of coordination: Secondary | ICD-10-CM | POA: Diagnosis not present

## 2020-05-26 DIAGNOSIS — R634 Abnormal weight loss: Secondary | ICD-10-CM | POA: Diagnosis not present

## 2020-05-26 DIAGNOSIS — M6281 Muscle weakness (generalized): Secondary | ICD-10-CM | POA: Diagnosis not present

## 2020-05-26 DIAGNOSIS — N390 Urinary tract infection, site not specified: Secondary | ICD-10-CM | POA: Diagnosis not present

## 2020-05-26 DIAGNOSIS — R2689 Other abnormalities of gait and mobility: Secondary | ICD-10-CM | POA: Diagnosis not present

## 2020-05-26 DIAGNOSIS — I639 Cerebral infarction, unspecified: Secondary | ICD-10-CM | POA: Diagnosis not present

## 2020-05-26 DIAGNOSIS — R2681 Unsteadiness on feet: Secondary | ICD-10-CM | POA: Diagnosis not present

## 2020-05-27 DIAGNOSIS — R279 Unspecified lack of coordination: Secondary | ICD-10-CM | POA: Diagnosis not present

## 2020-05-27 DIAGNOSIS — R2681 Unsteadiness on feet: Secondary | ICD-10-CM | POA: Diagnosis not present

## 2020-05-27 DIAGNOSIS — R634 Abnormal weight loss: Secondary | ICD-10-CM | POA: Diagnosis not present

## 2020-05-27 DIAGNOSIS — I639 Cerebral infarction, unspecified: Secondary | ICD-10-CM | POA: Diagnosis not present

## 2020-05-27 DIAGNOSIS — N390 Urinary tract infection, site not specified: Secondary | ICD-10-CM | POA: Diagnosis not present

## 2020-05-27 DIAGNOSIS — R2689 Other abnormalities of gait and mobility: Secondary | ICD-10-CM | POA: Diagnosis not present

## 2020-05-27 DIAGNOSIS — R262 Difficulty in walking, not elsewhere classified: Secondary | ICD-10-CM | POA: Diagnosis not present

## 2020-05-27 DIAGNOSIS — M6281 Muscle weakness (generalized): Secondary | ICD-10-CM | POA: Diagnosis not present

## 2020-05-28 DIAGNOSIS — R279 Unspecified lack of coordination: Secondary | ICD-10-CM | POA: Diagnosis not present

## 2020-05-28 DIAGNOSIS — R262 Difficulty in walking, not elsewhere classified: Secondary | ICD-10-CM | POA: Diagnosis not present

## 2020-05-28 DIAGNOSIS — N390 Urinary tract infection, site not specified: Secondary | ICD-10-CM | POA: Diagnosis not present

## 2020-05-28 DIAGNOSIS — R634 Abnormal weight loss: Secondary | ICD-10-CM | POA: Diagnosis not present

## 2020-05-28 DIAGNOSIS — M6281 Muscle weakness (generalized): Secondary | ICD-10-CM | POA: Diagnosis not present

## 2020-05-28 DIAGNOSIS — R2689 Other abnormalities of gait and mobility: Secondary | ICD-10-CM | POA: Diagnosis not present

## 2020-05-28 DIAGNOSIS — R2681 Unsteadiness on feet: Secondary | ICD-10-CM | POA: Diagnosis not present

## 2020-05-28 DIAGNOSIS — I639 Cerebral infarction, unspecified: Secondary | ICD-10-CM | POA: Diagnosis not present

## 2020-05-29 DIAGNOSIS — N390 Urinary tract infection, site not specified: Secondary | ICD-10-CM | POA: Diagnosis not present

## 2020-05-29 DIAGNOSIS — M6281 Muscle weakness (generalized): Secondary | ICD-10-CM | POA: Diagnosis not present

## 2020-05-29 DIAGNOSIS — R262 Difficulty in walking, not elsewhere classified: Secondary | ICD-10-CM | POA: Diagnosis not present

## 2020-05-29 DIAGNOSIS — I639 Cerebral infarction, unspecified: Secondary | ICD-10-CM | POA: Diagnosis not present

## 2020-05-29 DIAGNOSIS — R279 Unspecified lack of coordination: Secondary | ICD-10-CM | POA: Diagnosis not present

## 2020-05-29 DIAGNOSIS — R2681 Unsteadiness on feet: Secondary | ICD-10-CM | POA: Diagnosis not present

## 2020-05-29 DIAGNOSIS — R2689 Other abnormalities of gait and mobility: Secondary | ICD-10-CM | POA: Diagnosis not present

## 2020-05-29 DIAGNOSIS — R634 Abnormal weight loss: Secondary | ICD-10-CM | POA: Diagnosis not present

## 2020-05-30 DIAGNOSIS — R279 Unspecified lack of coordination: Secondary | ICD-10-CM | POA: Diagnosis not present

## 2020-05-30 DIAGNOSIS — R634 Abnormal weight loss: Secondary | ICD-10-CM | POA: Diagnosis not present

## 2020-05-30 DIAGNOSIS — I639 Cerebral infarction, unspecified: Secondary | ICD-10-CM | POA: Diagnosis not present

## 2020-05-30 DIAGNOSIS — Z03818 Encounter for observation for suspected exposure to other biological agents ruled out: Secondary | ICD-10-CM | POA: Diagnosis not present

## 2020-05-30 DIAGNOSIS — M6281 Muscle weakness (generalized): Secondary | ICD-10-CM | POA: Diagnosis not present

## 2020-05-30 DIAGNOSIS — N390 Urinary tract infection, site not specified: Secondary | ICD-10-CM | POA: Diagnosis not present

## 2020-05-30 DIAGNOSIS — R2681 Unsteadiness on feet: Secondary | ICD-10-CM | POA: Diagnosis not present

## 2020-05-30 DIAGNOSIS — R2689 Other abnormalities of gait and mobility: Secondary | ICD-10-CM | POA: Diagnosis not present

## 2020-05-30 DIAGNOSIS — R262 Difficulty in walking, not elsewhere classified: Secondary | ICD-10-CM | POA: Diagnosis not present

## 2020-05-31 DIAGNOSIS — R2681 Unsteadiness on feet: Secondary | ICD-10-CM | POA: Diagnosis not present

## 2020-05-31 DIAGNOSIS — M6281 Muscle weakness (generalized): Secondary | ICD-10-CM | POA: Diagnosis not present

## 2020-05-31 DIAGNOSIS — R279 Unspecified lack of coordination: Secondary | ICD-10-CM | POA: Diagnosis not present

## 2020-05-31 DIAGNOSIS — N390 Urinary tract infection, site not specified: Secondary | ICD-10-CM | POA: Diagnosis not present

## 2020-05-31 DIAGNOSIS — R2689 Other abnormalities of gait and mobility: Secondary | ICD-10-CM | POA: Diagnosis not present

## 2020-05-31 DIAGNOSIS — R634 Abnormal weight loss: Secondary | ICD-10-CM | POA: Diagnosis not present

## 2020-05-31 DIAGNOSIS — I639 Cerebral infarction, unspecified: Secondary | ICD-10-CM | POA: Diagnosis not present

## 2020-05-31 DIAGNOSIS — R262 Difficulty in walking, not elsewhere classified: Secondary | ICD-10-CM | POA: Diagnosis not present

## 2020-06-01 DIAGNOSIS — R634 Abnormal weight loss: Secondary | ICD-10-CM | POA: Diagnosis not present

## 2020-06-01 DIAGNOSIS — R2681 Unsteadiness on feet: Secondary | ICD-10-CM | POA: Diagnosis not present

## 2020-06-01 DIAGNOSIS — N390 Urinary tract infection, site not specified: Secondary | ICD-10-CM | POA: Diagnosis not present

## 2020-06-01 DIAGNOSIS — R262 Difficulty in walking, not elsewhere classified: Secondary | ICD-10-CM | POA: Diagnosis not present

## 2020-06-01 DIAGNOSIS — R279 Unspecified lack of coordination: Secondary | ICD-10-CM | POA: Diagnosis not present

## 2020-06-01 DIAGNOSIS — R2689 Other abnormalities of gait and mobility: Secondary | ICD-10-CM | POA: Diagnosis not present

## 2020-06-01 DIAGNOSIS — I639 Cerebral infarction, unspecified: Secondary | ICD-10-CM | POA: Diagnosis not present

## 2020-06-01 DIAGNOSIS — M6281 Muscle weakness (generalized): Secondary | ICD-10-CM | POA: Diagnosis not present

## 2020-06-02 DIAGNOSIS — R262 Difficulty in walking, not elsewhere classified: Secondary | ICD-10-CM | POA: Diagnosis not present

## 2020-06-02 DIAGNOSIS — R2681 Unsteadiness on feet: Secondary | ICD-10-CM | POA: Diagnosis not present

## 2020-06-02 DIAGNOSIS — R2689 Other abnormalities of gait and mobility: Secondary | ICD-10-CM | POA: Diagnosis not present

## 2020-06-02 DIAGNOSIS — M6281 Muscle weakness (generalized): Secondary | ICD-10-CM | POA: Diagnosis not present

## 2020-06-02 DIAGNOSIS — R279 Unspecified lack of coordination: Secondary | ICD-10-CM | POA: Diagnosis not present

## 2020-06-02 DIAGNOSIS — R634 Abnormal weight loss: Secondary | ICD-10-CM | POA: Diagnosis not present

## 2020-06-02 DIAGNOSIS — N390 Urinary tract infection, site not specified: Secondary | ICD-10-CM | POA: Diagnosis not present

## 2020-06-02 DIAGNOSIS — I639 Cerebral infarction, unspecified: Secondary | ICD-10-CM | POA: Diagnosis not present

## 2020-06-03 DIAGNOSIS — R2681 Unsteadiness on feet: Secondary | ICD-10-CM | POA: Diagnosis not present

## 2020-06-03 DIAGNOSIS — M6281 Muscle weakness (generalized): Secondary | ICD-10-CM | POA: Diagnosis not present

## 2020-06-03 DIAGNOSIS — R279 Unspecified lack of coordination: Secondary | ICD-10-CM | POA: Diagnosis not present

## 2020-06-03 DIAGNOSIS — R2689 Other abnormalities of gait and mobility: Secondary | ICD-10-CM | POA: Diagnosis not present

## 2020-06-03 DIAGNOSIS — R262 Difficulty in walking, not elsewhere classified: Secondary | ICD-10-CM | POA: Diagnosis not present

## 2020-06-03 DIAGNOSIS — N390 Urinary tract infection, site not specified: Secondary | ICD-10-CM | POA: Diagnosis not present

## 2020-06-03 DIAGNOSIS — R634 Abnormal weight loss: Secondary | ICD-10-CM | POA: Diagnosis not present

## 2020-06-03 DIAGNOSIS — I639 Cerebral infarction, unspecified: Secondary | ICD-10-CM | POA: Diagnosis not present

## 2020-06-04 DIAGNOSIS — N390 Urinary tract infection, site not specified: Secondary | ICD-10-CM | POA: Diagnosis not present

## 2020-06-04 DIAGNOSIS — R2689 Other abnormalities of gait and mobility: Secondary | ICD-10-CM | POA: Diagnosis not present

## 2020-06-04 DIAGNOSIS — R2681 Unsteadiness on feet: Secondary | ICD-10-CM | POA: Diagnosis not present

## 2020-06-04 DIAGNOSIS — R262 Difficulty in walking, not elsewhere classified: Secondary | ICD-10-CM | POA: Diagnosis not present

## 2020-06-04 DIAGNOSIS — M6281 Muscle weakness (generalized): Secondary | ICD-10-CM | POA: Diagnosis not present

## 2020-06-04 DIAGNOSIS — I639 Cerebral infarction, unspecified: Secondary | ICD-10-CM | POA: Diagnosis not present

## 2020-06-04 DIAGNOSIS — R279 Unspecified lack of coordination: Secondary | ICD-10-CM | POA: Diagnosis not present

## 2020-06-04 DIAGNOSIS — R634 Abnormal weight loss: Secondary | ICD-10-CM | POA: Diagnosis not present

## 2020-06-05 DIAGNOSIS — R2681 Unsteadiness on feet: Secondary | ICD-10-CM | POA: Diagnosis not present

## 2020-06-05 DIAGNOSIS — R634 Abnormal weight loss: Secondary | ICD-10-CM | POA: Diagnosis not present

## 2020-06-05 DIAGNOSIS — R2689 Other abnormalities of gait and mobility: Secondary | ICD-10-CM | POA: Diagnosis not present

## 2020-06-05 DIAGNOSIS — I639 Cerebral infarction, unspecified: Secondary | ICD-10-CM | POA: Diagnosis not present

## 2020-06-05 DIAGNOSIS — R279 Unspecified lack of coordination: Secondary | ICD-10-CM | POA: Diagnosis not present

## 2020-06-05 DIAGNOSIS — M6281 Muscle weakness (generalized): Secondary | ICD-10-CM | POA: Diagnosis not present

## 2020-06-05 DIAGNOSIS — R262 Difficulty in walking, not elsewhere classified: Secondary | ICD-10-CM | POA: Diagnosis not present

## 2020-06-05 DIAGNOSIS — N390 Urinary tract infection, site not specified: Secondary | ICD-10-CM | POA: Diagnosis not present

## 2020-06-06 DIAGNOSIS — I639 Cerebral infarction, unspecified: Secondary | ICD-10-CM | POA: Diagnosis not present

## 2020-06-06 DIAGNOSIS — R279 Unspecified lack of coordination: Secondary | ICD-10-CM | POA: Diagnosis not present

## 2020-06-06 DIAGNOSIS — M6281 Muscle weakness (generalized): Secondary | ICD-10-CM | POA: Diagnosis not present

## 2020-06-06 DIAGNOSIS — Z03818 Encounter for observation for suspected exposure to other biological agents ruled out: Secondary | ICD-10-CM | POA: Diagnosis not present

## 2020-06-06 DIAGNOSIS — R262 Difficulty in walking, not elsewhere classified: Secondary | ICD-10-CM | POA: Diagnosis not present

## 2020-06-06 DIAGNOSIS — R634 Abnormal weight loss: Secondary | ICD-10-CM | POA: Diagnosis not present

## 2020-06-06 DIAGNOSIS — R2689 Other abnormalities of gait and mobility: Secondary | ICD-10-CM | POA: Diagnosis not present

## 2020-06-06 DIAGNOSIS — R2681 Unsteadiness on feet: Secondary | ICD-10-CM | POA: Diagnosis not present

## 2020-06-06 DIAGNOSIS — N390 Urinary tract infection, site not specified: Secondary | ICD-10-CM | POA: Diagnosis not present

## 2020-06-07 DIAGNOSIS — R2689 Other abnormalities of gait and mobility: Secondary | ICD-10-CM | POA: Diagnosis not present

## 2020-06-07 DIAGNOSIS — R279 Unspecified lack of coordination: Secondary | ICD-10-CM | POA: Diagnosis not present

## 2020-06-07 DIAGNOSIS — I639 Cerebral infarction, unspecified: Secondary | ICD-10-CM | POA: Diagnosis not present

## 2020-06-07 DIAGNOSIS — R634 Abnormal weight loss: Secondary | ICD-10-CM | POA: Diagnosis not present

## 2020-06-07 DIAGNOSIS — R262 Difficulty in walking, not elsewhere classified: Secondary | ICD-10-CM | POA: Diagnosis not present

## 2020-06-07 DIAGNOSIS — R2681 Unsteadiness on feet: Secondary | ICD-10-CM | POA: Diagnosis not present

## 2020-06-07 DIAGNOSIS — M6281 Muscle weakness (generalized): Secondary | ICD-10-CM | POA: Diagnosis not present

## 2020-06-07 DIAGNOSIS — N390 Urinary tract infection, site not specified: Secondary | ICD-10-CM | POA: Diagnosis not present

## 2020-06-08 DIAGNOSIS — R279 Unspecified lack of coordination: Secondary | ICD-10-CM | POA: Diagnosis not present

## 2020-06-08 DIAGNOSIS — N390 Urinary tract infection, site not specified: Secondary | ICD-10-CM | POA: Diagnosis not present

## 2020-06-08 DIAGNOSIS — M6281 Muscle weakness (generalized): Secondary | ICD-10-CM | POA: Diagnosis not present

## 2020-06-08 DIAGNOSIS — R2681 Unsteadiness on feet: Secondary | ICD-10-CM | POA: Diagnosis not present

## 2020-06-08 DIAGNOSIS — R2689 Other abnormalities of gait and mobility: Secondary | ICD-10-CM | POA: Diagnosis not present

## 2020-06-08 DIAGNOSIS — I639 Cerebral infarction, unspecified: Secondary | ICD-10-CM | POA: Diagnosis not present

## 2020-06-08 DIAGNOSIS — R262 Difficulty in walking, not elsewhere classified: Secondary | ICD-10-CM | POA: Diagnosis not present

## 2020-06-08 DIAGNOSIS — R634 Abnormal weight loss: Secondary | ICD-10-CM | POA: Diagnosis not present

## 2020-06-10 DIAGNOSIS — I639 Cerebral infarction, unspecified: Secondary | ICD-10-CM | POA: Diagnosis not present

## 2020-06-10 DIAGNOSIS — R279 Unspecified lack of coordination: Secondary | ICD-10-CM | POA: Diagnosis not present

## 2020-06-10 DIAGNOSIS — N390 Urinary tract infection, site not specified: Secondary | ICD-10-CM | POA: Diagnosis not present

## 2020-06-10 DIAGNOSIS — R262 Difficulty in walking, not elsewhere classified: Secondary | ICD-10-CM | POA: Diagnosis not present

## 2020-06-10 DIAGNOSIS — R2689 Other abnormalities of gait and mobility: Secondary | ICD-10-CM | POA: Diagnosis not present

## 2020-06-10 DIAGNOSIS — R2681 Unsteadiness on feet: Secondary | ICD-10-CM | POA: Diagnosis not present

## 2020-06-10 DIAGNOSIS — M6281 Muscle weakness (generalized): Secondary | ICD-10-CM | POA: Diagnosis not present

## 2020-06-10 DIAGNOSIS — R634 Abnormal weight loss: Secondary | ICD-10-CM | POA: Diagnosis not present

## 2020-06-11 DIAGNOSIS — R262 Difficulty in walking, not elsewhere classified: Secondary | ICD-10-CM | POA: Diagnosis not present

## 2020-06-11 DIAGNOSIS — R2689 Other abnormalities of gait and mobility: Secondary | ICD-10-CM | POA: Diagnosis not present

## 2020-06-11 DIAGNOSIS — I639 Cerebral infarction, unspecified: Secondary | ICD-10-CM | POA: Diagnosis not present

## 2020-06-11 DIAGNOSIS — R2681 Unsteadiness on feet: Secondary | ICD-10-CM | POA: Diagnosis not present

## 2020-06-11 DIAGNOSIS — M6281 Muscle weakness (generalized): Secondary | ICD-10-CM | POA: Diagnosis not present

## 2020-06-11 DIAGNOSIS — N390 Urinary tract infection, site not specified: Secondary | ICD-10-CM | POA: Diagnosis not present

## 2020-06-11 DIAGNOSIS — R634 Abnormal weight loss: Secondary | ICD-10-CM | POA: Diagnosis not present

## 2020-06-11 DIAGNOSIS — R279 Unspecified lack of coordination: Secondary | ICD-10-CM | POA: Diagnosis not present

## 2020-06-12 DIAGNOSIS — R2681 Unsteadiness on feet: Secondary | ICD-10-CM | POA: Diagnosis not present

## 2020-06-12 DIAGNOSIS — R2689 Other abnormalities of gait and mobility: Secondary | ICD-10-CM | POA: Diagnosis not present

## 2020-06-12 DIAGNOSIS — R279 Unspecified lack of coordination: Secondary | ICD-10-CM | POA: Diagnosis not present

## 2020-06-12 DIAGNOSIS — R262 Difficulty in walking, not elsewhere classified: Secondary | ICD-10-CM | POA: Diagnosis not present

## 2020-06-12 DIAGNOSIS — I639 Cerebral infarction, unspecified: Secondary | ICD-10-CM | POA: Diagnosis not present

## 2020-06-12 DIAGNOSIS — R634 Abnormal weight loss: Secondary | ICD-10-CM | POA: Diagnosis not present

## 2020-06-12 DIAGNOSIS — M6281 Muscle weakness (generalized): Secondary | ICD-10-CM | POA: Diagnosis not present

## 2020-06-12 DIAGNOSIS — N390 Urinary tract infection, site not specified: Secondary | ICD-10-CM | POA: Diagnosis not present

## 2020-06-13 DIAGNOSIS — R262 Difficulty in walking, not elsewhere classified: Secondary | ICD-10-CM | POA: Diagnosis not present

## 2020-06-13 DIAGNOSIS — M6281 Muscle weakness (generalized): Secondary | ICD-10-CM | POA: Diagnosis not present

## 2020-06-13 DIAGNOSIS — R2681 Unsteadiness on feet: Secondary | ICD-10-CM | POA: Diagnosis not present

## 2020-06-13 DIAGNOSIS — R279 Unspecified lack of coordination: Secondary | ICD-10-CM | POA: Diagnosis not present

## 2020-06-13 DIAGNOSIS — R634 Abnormal weight loss: Secondary | ICD-10-CM | POA: Diagnosis not present

## 2020-06-13 DIAGNOSIS — N390 Urinary tract infection, site not specified: Secondary | ICD-10-CM | POA: Diagnosis not present

## 2020-06-13 DIAGNOSIS — I639 Cerebral infarction, unspecified: Secondary | ICD-10-CM | POA: Diagnosis not present

## 2020-06-13 DIAGNOSIS — R2689 Other abnormalities of gait and mobility: Secondary | ICD-10-CM | POA: Diagnosis not present

## 2020-06-14 DIAGNOSIS — N390 Urinary tract infection, site not specified: Secondary | ICD-10-CM | POA: Diagnosis not present

## 2020-06-14 DIAGNOSIS — R2681 Unsteadiness on feet: Secondary | ICD-10-CM | POA: Diagnosis not present

## 2020-06-14 DIAGNOSIS — R2689 Other abnormalities of gait and mobility: Secondary | ICD-10-CM | POA: Diagnosis not present

## 2020-06-14 DIAGNOSIS — R634 Abnormal weight loss: Secondary | ICD-10-CM | POA: Diagnosis not present

## 2020-06-14 DIAGNOSIS — M6281 Muscle weakness (generalized): Secondary | ICD-10-CM | POA: Diagnosis not present

## 2020-06-14 DIAGNOSIS — R279 Unspecified lack of coordination: Secondary | ICD-10-CM | POA: Diagnosis not present

## 2020-06-14 DIAGNOSIS — R262 Difficulty in walking, not elsewhere classified: Secondary | ICD-10-CM | POA: Diagnosis not present

## 2020-06-14 DIAGNOSIS — I639 Cerebral infarction, unspecified: Secondary | ICD-10-CM | POA: Diagnosis not present

## 2020-06-15 DIAGNOSIS — R2689 Other abnormalities of gait and mobility: Secondary | ICD-10-CM | POA: Diagnosis not present

## 2020-06-15 DIAGNOSIS — R634 Abnormal weight loss: Secondary | ICD-10-CM | POA: Diagnosis not present

## 2020-06-15 DIAGNOSIS — R279 Unspecified lack of coordination: Secondary | ICD-10-CM | POA: Diagnosis not present

## 2020-06-15 DIAGNOSIS — M6281 Muscle weakness (generalized): Secondary | ICD-10-CM | POA: Diagnosis not present

## 2020-06-15 DIAGNOSIS — N390 Urinary tract infection, site not specified: Secondary | ICD-10-CM | POA: Diagnosis not present

## 2020-06-15 DIAGNOSIS — R2681 Unsteadiness on feet: Secondary | ICD-10-CM | POA: Diagnosis not present

## 2020-06-15 DIAGNOSIS — I639 Cerebral infarction, unspecified: Secondary | ICD-10-CM | POA: Diagnosis not present

## 2020-06-15 DIAGNOSIS — R262 Difficulty in walking, not elsewhere classified: Secondary | ICD-10-CM | POA: Diagnosis not present

## 2020-06-16 DIAGNOSIS — R2689 Other abnormalities of gait and mobility: Secondary | ICD-10-CM | POA: Diagnosis not present

## 2020-06-16 DIAGNOSIS — R262 Difficulty in walking, not elsewhere classified: Secondary | ICD-10-CM | POA: Diagnosis not present

## 2020-06-16 DIAGNOSIS — I639 Cerebral infarction, unspecified: Secondary | ICD-10-CM | POA: Diagnosis not present

## 2020-06-16 DIAGNOSIS — R634 Abnormal weight loss: Secondary | ICD-10-CM | POA: Diagnosis not present

## 2020-06-16 DIAGNOSIS — R2681 Unsteadiness on feet: Secondary | ICD-10-CM | POA: Diagnosis not present

## 2020-06-16 DIAGNOSIS — M6281 Muscle weakness (generalized): Secondary | ICD-10-CM | POA: Diagnosis not present

## 2020-06-16 DIAGNOSIS — N390 Urinary tract infection, site not specified: Secondary | ICD-10-CM | POA: Diagnosis not present

## 2020-06-16 DIAGNOSIS — R279 Unspecified lack of coordination: Secondary | ICD-10-CM | POA: Diagnosis not present

## 2020-06-17 DIAGNOSIS — R2689 Other abnormalities of gait and mobility: Secondary | ICD-10-CM | POA: Diagnosis not present

## 2020-06-17 DIAGNOSIS — R2681 Unsteadiness on feet: Secondary | ICD-10-CM | POA: Diagnosis not present

## 2020-06-17 DIAGNOSIS — I639 Cerebral infarction, unspecified: Secondary | ICD-10-CM | POA: Diagnosis not present

## 2020-06-17 DIAGNOSIS — M6281 Muscle weakness (generalized): Secondary | ICD-10-CM | POA: Diagnosis not present

## 2020-06-17 DIAGNOSIS — R262 Difficulty in walking, not elsewhere classified: Secondary | ICD-10-CM | POA: Diagnosis not present

## 2020-06-17 DIAGNOSIS — N390 Urinary tract infection, site not specified: Secondary | ICD-10-CM | POA: Diagnosis not present

## 2020-06-17 DIAGNOSIS — R279 Unspecified lack of coordination: Secondary | ICD-10-CM | POA: Diagnosis not present

## 2020-06-17 DIAGNOSIS — R634 Abnormal weight loss: Secondary | ICD-10-CM | POA: Diagnosis not present

## 2020-06-18 DIAGNOSIS — R2689 Other abnormalities of gait and mobility: Secondary | ICD-10-CM | POA: Diagnosis not present

## 2020-06-18 DIAGNOSIS — R634 Abnormal weight loss: Secondary | ICD-10-CM | POA: Diagnosis not present

## 2020-06-18 DIAGNOSIS — I639 Cerebral infarction, unspecified: Secondary | ICD-10-CM | POA: Diagnosis not present

## 2020-06-18 DIAGNOSIS — R2681 Unsteadiness on feet: Secondary | ICD-10-CM | POA: Diagnosis not present

## 2020-06-18 DIAGNOSIS — R262 Difficulty in walking, not elsewhere classified: Secondary | ICD-10-CM | POA: Diagnosis not present

## 2020-06-18 DIAGNOSIS — N390 Urinary tract infection, site not specified: Secondary | ICD-10-CM | POA: Diagnosis not present

## 2020-06-18 DIAGNOSIS — R279 Unspecified lack of coordination: Secondary | ICD-10-CM | POA: Diagnosis not present

## 2020-06-18 DIAGNOSIS — M6281 Muscle weakness (generalized): Secondary | ICD-10-CM | POA: Diagnosis not present

## 2020-06-19 DIAGNOSIS — R279 Unspecified lack of coordination: Secondary | ICD-10-CM | POA: Diagnosis not present

## 2020-06-19 DIAGNOSIS — N390 Urinary tract infection, site not specified: Secondary | ICD-10-CM | POA: Diagnosis not present

## 2020-06-19 DIAGNOSIS — R2689 Other abnormalities of gait and mobility: Secondary | ICD-10-CM | POA: Diagnosis not present

## 2020-06-19 DIAGNOSIS — R262 Difficulty in walking, not elsewhere classified: Secondary | ICD-10-CM | POA: Diagnosis not present

## 2020-06-19 DIAGNOSIS — M6281 Muscle weakness (generalized): Secondary | ICD-10-CM | POA: Diagnosis not present

## 2020-06-19 DIAGNOSIS — I639 Cerebral infarction, unspecified: Secondary | ICD-10-CM | POA: Diagnosis not present

## 2020-06-19 DIAGNOSIS — R634 Abnormal weight loss: Secondary | ICD-10-CM | POA: Diagnosis not present

## 2020-06-19 DIAGNOSIS — R2681 Unsteadiness on feet: Secondary | ICD-10-CM | POA: Diagnosis not present

## 2020-06-20 DIAGNOSIS — R262 Difficulty in walking, not elsewhere classified: Secondary | ICD-10-CM | POA: Diagnosis not present

## 2020-06-20 DIAGNOSIS — R2689 Other abnormalities of gait and mobility: Secondary | ICD-10-CM | POA: Diagnosis not present

## 2020-06-20 DIAGNOSIS — N390 Urinary tract infection, site not specified: Secondary | ICD-10-CM | POA: Diagnosis not present

## 2020-06-20 DIAGNOSIS — M6281 Muscle weakness (generalized): Secondary | ICD-10-CM | POA: Diagnosis not present

## 2020-06-20 DIAGNOSIS — I639 Cerebral infarction, unspecified: Secondary | ICD-10-CM | POA: Diagnosis not present

## 2020-06-20 DIAGNOSIS — R634 Abnormal weight loss: Secondary | ICD-10-CM | POA: Diagnosis not present

## 2020-06-20 DIAGNOSIS — U071 COVID-19: Secondary | ICD-10-CM | POA: Diagnosis not present

## 2020-06-20 DIAGNOSIS — R2681 Unsteadiness on feet: Secondary | ICD-10-CM | POA: Diagnosis not present

## 2020-06-20 DIAGNOSIS — R279 Unspecified lack of coordination: Secondary | ICD-10-CM | POA: Diagnosis not present

## 2020-06-21 DIAGNOSIS — I639 Cerebral infarction, unspecified: Secondary | ICD-10-CM | POA: Diagnosis not present

## 2020-06-21 DIAGNOSIS — N390 Urinary tract infection, site not specified: Secondary | ICD-10-CM | POA: Diagnosis not present

## 2020-06-21 DIAGNOSIS — R2689 Other abnormalities of gait and mobility: Secondary | ICD-10-CM | POA: Diagnosis not present

## 2020-06-21 DIAGNOSIS — M6281 Muscle weakness (generalized): Secondary | ICD-10-CM | POA: Diagnosis not present

## 2020-06-21 DIAGNOSIS — R2681 Unsteadiness on feet: Secondary | ICD-10-CM | POA: Diagnosis not present

## 2020-06-21 DIAGNOSIS — R634 Abnormal weight loss: Secondary | ICD-10-CM | POA: Diagnosis not present

## 2020-06-21 DIAGNOSIS — R279 Unspecified lack of coordination: Secondary | ICD-10-CM | POA: Diagnosis not present

## 2020-06-21 DIAGNOSIS — R262 Difficulty in walking, not elsewhere classified: Secondary | ICD-10-CM | POA: Diagnosis not present

## 2020-06-22 DIAGNOSIS — I639 Cerebral infarction, unspecified: Secondary | ICD-10-CM | POA: Diagnosis not present

## 2020-06-22 DIAGNOSIS — N390 Urinary tract infection, site not specified: Secondary | ICD-10-CM | POA: Diagnosis not present

## 2020-06-22 DIAGNOSIS — R262 Difficulty in walking, not elsewhere classified: Secondary | ICD-10-CM | POA: Diagnosis not present

## 2020-06-22 DIAGNOSIS — R279 Unspecified lack of coordination: Secondary | ICD-10-CM | POA: Diagnosis not present

## 2020-06-22 DIAGNOSIS — R2689 Other abnormalities of gait and mobility: Secondary | ICD-10-CM | POA: Diagnosis not present

## 2020-06-22 DIAGNOSIS — R2681 Unsteadiness on feet: Secondary | ICD-10-CM | POA: Diagnosis not present

## 2020-06-22 DIAGNOSIS — M6281 Muscle weakness (generalized): Secondary | ICD-10-CM | POA: Diagnosis not present

## 2020-06-22 DIAGNOSIS — R634 Abnormal weight loss: Secondary | ICD-10-CM | POA: Diagnosis not present

## 2020-06-23 DIAGNOSIS — I639 Cerebral infarction, unspecified: Secondary | ICD-10-CM | POA: Diagnosis not present

## 2020-06-23 DIAGNOSIS — R634 Abnormal weight loss: Secondary | ICD-10-CM | POA: Diagnosis not present

## 2020-06-23 DIAGNOSIS — N390 Urinary tract infection, site not specified: Secondary | ICD-10-CM | POA: Diagnosis not present

## 2020-06-23 DIAGNOSIS — R2681 Unsteadiness on feet: Secondary | ICD-10-CM | POA: Diagnosis not present

## 2020-06-23 DIAGNOSIS — R262 Difficulty in walking, not elsewhere classified: Secondary | ICD-10-CM | POA: Diagnosis not present

## 2020-06-23 DIAGNOSIS — R279 Unspecified lack of coordination: Secondary | ICD-10-CM | POA: Diagnosis not present

## 2020-06-23 DIAGNOSIS — M6281 Muscle weakness (generalized): Secondary | ICD-10-CM | POA: Diagnosis not present

## 2020-06-23 DIAGNOSIS — R2689 Other abnormalities of gait and mobility: Secondary | ICD-10-CM | POA: Diagnosis not present

## 2020-06-24 DIAGNOSIS — I639 Cerebral infarction, unspecified: Secondary | ICD-10-CM | POA: Diagnosis not present

## 2020-06-24 DIAGNOSIS — N390 Urinary tract infection, site not specified: Secondary | ICD-10-CM | POA: Diagnosis not present

## 2020-06-24 DIAGNOSIS — R634 Abnormal weight loss: Secondary | ICD-10-CM | POA: Diagnosis not present

## 2020-06-24 DIAGNOSIS — R262 Difficulty in walking, not elsewhere classified: Secondary | ICD-10-CM | POA: Diagnosis not present

## 2020-06-24 DIAGNOSIS — R2681 Unsteadiness on feet: Secondary | ICD-10-CM | POA: Diagnosis not present

## 2020-06-24 DIAGNOSIS — M6281 Muscle weakness (generalized): Secondary | ICD-10-CM | POA: Diagnosis not present

## 2020-06-24 DIAGNOSIS — R279 Unspecified lack of coordination: Secondary | ICD-10-CM | POA: Diagnosis not present

## 2020-06-24 DIAGNOSIS — R2689 Other abnormalities of gait and mobility: Secondary | ICD-10-CM | POA: Diagnosis not present

## 2020-06-25 DIAGNOSIS — R262 Difficulty in walking, not elsewhere classified: Secondary | ICD-10-CM | POA: Diagnosis not present

## 2020-06-25 DIAGNOSIS — R634 Abnormal weight loss: Secondary | ICD-10-CM | POA: Diagnosis not present

## 2020-06-25 DIAGNOSIS — R2689 Other abnormalities of gait and mobility: Secondary | ICD-10-CM | POA: Diagnosis not present

## 2020-06-25 DIAGNOSIS — R2681 Unsteadiness on feet: Secondary | ICD-10-CM | POA: Diagnosis not present

## 2020-06-25 DIAGNOSIS — M6281 Muscle weakness (generalized): Secondary | ICD-10-CM | POA: Diagnosis not present

## 2020-06-25 DIAGNOSIS — R279 Unspecified lack of coordination: Secondary | ICD-10-CM | POA: Diagnosis not present

## 2020-06-25 DIAGNOSIS — I639 Cerebral infarction, unspecified: Secondary | ICD-10-CM | POA: Diagnosis not present

## 2020-06-25 DIAGNOSIS — N390 Urinary tract infection, site not specified: Secondary | ICD-10-CM | POA: Diagnosis not present

## 2020-06-26 DIAGNOSIS — R279 Unspecified lack of coordination: Secondary | ICD-10-CM | POA: Diagnosis not present

## 2020-06-26 DIAGNOSIS — I639 Cerebral infarction, unspecified: Secondary | ICD-10-CM | POA: Diagnosis not present

## 2020-06-26 DIAGNOSIS — R634 Abnormal weight loss: Secondary | ICD-10-CM | POA: Diagnosis not present

## 2020-06-26 DIAGNOSIS — M6281 Muscle weakness (generalized): Secondary | ICD-10-CM | POA: Diagnosis not present

## 2020-06-26 DIAGNOSIS — N390 Urinary tract infection, site not specified: Secondary | ICD-10-CM | POA: Diagnosis not present

## 2020-06-26 DIAGNOSIS — R2681 Unsteadiness on feet: Secondary | ICD-10-CM | POA: Diagnosis not present

## 2020-06-26 DIAGNOSIS — R2689 Other abnormalities of gait and mobility: Secondary | ICD-10-CM | POA: Diagnosis not present

## 2020-06-26 DIAGNOSIS — R262 Difficulty in walking, not elsewhere classified: Secondary | ICD-10-CM | POA: Diagnosis not present

## 2020-06-27 DIAGNOSIS — R2681 Unsteadiness on feet: Secondary | ICD-10-CM | POA: Diagnosis not present

## 2020-06-27 DIAGNOSIS — R279 Unspecified lack of coordination: Secondary | ICD-10-CM | POA: Diagnosis not present

## 2020-06-27 DIAGNOSIS — M6281 Muscle weakness (generalized): Secondary | ICD-10-CM | POA: Diagnosis not present

## 2020-06-27 DIAGNOSIS — N390 Urinary tract infection, site not specified: Secondary | ICD-10-CM | POA: Diagnosis not present

## 2020-06-27 DIAGNOSIS — R634 Abnormal weight loss: Secondary | ICD-10-CM | POA: Diagnosis not present

## 2020-06-27 DIAGNOSIS — R2689 Other abnormalities of gait and mobility: Secondary | ICD-10-CM | POA: Diagnosis not present

## 2020-06-27 DIAGNOSIS — I639 Cerebral infarction, unspecified: Secondary | ICD-10-CM | POA: Diagnosis not present

## 2020-06-27 DIAGNOSIS — R262 Difficulty in walking, not elsewhere classified: Secondary | ICD-10-CM | POA: Diagnosis not present

## 2020-06-28 DIAGNOSIS — N390 Urinary tract infection, site not specified: Secondary | ICD-10-CM | POA: Diagnosis not present

## 2020-06-28 DIAGNOSIS — R2689 Other abnormalities of gait and mobility: Secondary | ICD-10-CM | POA: Diagnosis not present

## 2020-06-28 DIAGNOSIS — R262 Difficulty in walking, not elsewhere classified: Secondary | ICD-10-CM | POA: Diagnosis not present

## 2020-06-28 DIAGNOSIS — I639 Cerebral infarction, unspecified: Secondary | ICD-10-CM | POA: Diagnosis not present

## 2020-06-28 DIAGNOSIS — R634 Abnormal weight loss: Secondary | ICD-10-CM | POA: Diagnosis not present

## 2020-06-28 DIAGNOSIS — R279 Unspecified lack of coordination: Secondary | ICD-10-CM | POA: Diagnosis not present

## 2020-06-28 DIAGNOSIS — R2681 Unsteadiness on feet: Secondary | ICD-10-CM | POA: Diagnosis not present

## 2020-06-28 DIAGNOSIS — M6281 Muscle weakness (generalized): Secondary | ICD-10-CM | POA: Diagnosis not present

## 2020-06-29 DIAGNOSIS — R279 Unspecified lack of coordination: Secondary | ICD-10-CM | POA: Diagnosis not present

## 2020-06-29 DIAGNOSIS — N390 Urinary tract infection, site not specified: Secondary | ICD-10-CM | POA: Diagnosis not present

## 2020-06-29 DIAGNOSIS — M6281 Muscle weakness (generalized): Secondary | ICD-10-CM | POA: Diagnosis not present

## 2020-06-29 DIAGNOSIS — R2689 Other abnormalities of gait and mobility: Secondary | ICD-10-CM | POA: Diagnosis not present

## 2020-06-29 DIAGNOSIS — R634 Abnormal weight loss: Secondary | ICD-10-CM | POA: Diagnosis not present

## 2020-06-29 DIAGNOSIS — I639 Cerebral infarction, unspecified: Secondary | ICD-10-CM | POA: Diagnosis not present

## 2020-06-29 DIAGNOSIS — R2681 Unsteadiness on feet: Secondary | ICD-10-CM | POA: Diagnosis not present

## 2020-06-29 DIAGNOSIS — R262 Difficulty in walking, not elsewhere classified: Secondary | ICD-10-CM | POA: Diagnosis not present

## 2020-06-30 DIAGNOSIS — R279 Unspecified lack of coordination: Secondary | ICD-10-CM | POA: Diagnosis not present

## 2020-06-30 DIAGNOSIS — N390 Urinary tract infection, site not specified: Secondary | ICD-10-CM | POA: Diagnosis not present

## 2020-06-30 DIAGNOSIS — R262 Difficulty in walking, not elsewhere classified: Secondary | ICD-10-CM | POA: Diagnosis not present

## 2020-06-30 DIAGNOSIS — R2689 Other abnormalities of gait and mobility: Secondary | ICD-10-CM | POA: Diagnosis not present

## 2020-06-30 DIAGNOSIS — R634 Abnormal weight loss: Secondary | ICD-10-CM | POA: Diagnosis not present

## 2020-06-30 DIAGNOSIS — I639 Cerebral infarction, unspecified: Secondary | ICD-10-CM | POA: Diagnosis not present

## 2020-06-30 DIAGNOSIS — M6281 Muscle weakness (generalized): Secondary | ICD-10-CM | POA: Diagnosis not present

## 2020-06-30 DIAGNOSIS — R2681 Unsteadiness on feet: Secondary | ICD-10-CM | POA: Diagnosis not present

## 2020-07-01 DIAGNOSIS — R279 Unspecified lack of coordination: Secondary | ICD-10-CM | POA: Diagnosis not present

## 2020-07-01 DIAGNOSIS — I639 Cerebral infarction, unspecified: Secondary | ICD-10-CM | POA: Diagnosis not present

## 2020-07-01 DIAGNOSIS — R262 Difficulty in walking, not elsewhere classified: Secondary | ICD-10-CM | POA: Diagnosis not present

## 2020-07-01 DIAGNOSIS — R634 Abnormal weight loss: Secondary | ICD-10-CM | POA: Diagnosis not present

## 2020-07-01 DIAGNOSIS — R2681 Unsteadiness on feet: Secondary | ICD-10-CM | POA: Diagnosis not present

## 2020-07-01 DIAGNOSIS — M6281 Muscle weakness (generalized): Secondary | ICD-10-CM | POA: Diagnosis not present

## 2020-07-01 DIAGNOSIS — R2689 Other abnormalities of gait and mobility: Secondary | ICD-10-CM | POA: Diagnosis not present

## 2020-07-01 DIAGNOSIS — N390 Urinary tract infection, site not specified: Secondary | ICD-10-CM | POA: Diagnosis not present

## 2020-07-02 DIAGNOSIS — R262 Difficulty in walking, not elsewhere classified: Secondary | ICD-10-CM | POA: Diagnosis not present

## 2020-07-02 DIAGNOSIS — R2681 Unsteadiness on feet: Secondary | ICD-10-CM | POA: Diagnosis not present

## 2020-07-02 DIAGNOSIS — R2689 Other abnormalities of gait and mobility: Secondary | ICD-10-CM | POA: Diagnosis not present

## 2020-07-02 DIAGNOSIS — I639 Cerebral infarction, unspecified: Secondary | ICD-10-CM | POA: Diagnosis not present

## 2020-07-02 DIAGNOSIS — R279 Unspecified lack of coordination: Secondary | ICD-10-CM | POA: Diagnosis not present

## 2020-07-02 DIAGNOSIS — N390 Urinary tract infection, site not specified: Secondary | ICD-10-CM | POA: Diagnosis not present

## 2020-07-02 DIAGNOSIS — M6281 Muscle weakness (generalized): Secondary | ICD-10-CM | POA: Diagnosis not present

## 2020-07-02 DIAGNOSIS — R634 Abnormal weight loss: Secondary | ICD-10-CM | POA: Diagnosis not present

## 2020-07-03 DIAGNOSIS — I639 Cerebral infarction, unspecified: Secondary | ICD-10-CM | POA: Diagnosis not present

## 2020-07-03 DIAGNOSIS — M6281 Muscle weakness (generalized): Secondary | ICD-10-CM | POA: Diagnosis not present

## 2020-07-03 DIAGNOSIS — R279 Unspecified lack of coordination: Secondary | ICD-10-CM | POA: Diagnosis not present

## 2020-07-03 DIAGNOSIS — R634 Abnormal weight loss: Secondary | ICD-10-CM | POA: Diagnosis not present

## 2020-07-03 DIAGNOSIS — R2689 Other abnormalities of gait and mobility: Secondary | ICD-10-CM | POA: Diagnosis not present

## 2020-07-03 DIAGNOSIS — R2681 Unsteadiness on feet: Secondary | ICD-10-CM | POA: Diagnosis not present

## 2020-07-03 DIAGNOSIS — N390 Urinary tract infection, site not specified: Secondary | ICD-10-CM | POA: Diagnosis not present

## 2020-07-03 DIAGNOSIS — R262 Difficulty in walking, not elsewhere classified: Secondary | ICD-10-CM | POA: Diagnosis not present

## 2020-07-04 DIAGNOSIS — R262 Difficulty in walking, not elsewhere classified: Secondary | ICD-10-CM | POA: Diagnosis not present

## 2020-07-04 DIAGNOSIS — R2681 Unsteadiness on feet: Secondary | ICD-10-CM | POA: Diagnosis not present

## 2020-07-04 DIAGNOSIS — R279 Unspecified lack of coordination: Secondary | ICD-10-CM | POA: Diagnosis not present

## 2020-07-04 DIAGNOSIS — Z20822 Contact with and (suspected) exposure to covid-19: Secondary | ICD-10-CM | POA: Diagnosis not present

## 2020-07-04 DIAGNOSIS — N390 Urinary tract infection, site not specified: Secondary | ICD-10-CM | POA: Diagnosis not present

## 2020-07-04 DIAGNOSIS — R634 Abnormal weight loss: Secondary | ICD-10-CM | POA: Diagnosis not present

## 2020-07-04 DIAGNOSIS — I639 Cerebral infarction, unspecified: Secondary | ICD-10-CM | POA: Diagnosis not present

## 2020-07-04 DIAGNOSIS — R2689 Other abnormalities of gait and mobility: Secondary | ICD-10-CM | POA: Diagnosis not present

## 2020-07-04 DIAGNOSIS — M6281 Muscle weakness (generalized): Secondary | ICD-10-CM | POA: Diagnosis not present

## 2020-07-07 DIAGNOSIS — R279 Unspecified lack of coordination: Secondary | ICD-10-CM | POA: Diagnosis not present

## 2020-07-07 DIAGNOSIS — R634 Abnormal weight loss: Secondary | ICD-10-CM | POA: Diagnosis not present

## 2020-07-07 DIAGNOSIS — R2681 Unsteadiness on feet: Secondary | ICD-10-CM | POA: Diagnosis not present

## 2020-07-07 DIAGNOSIS — N390 Urinary tract infection, site not specified: Secondary | ICD-10-CM | POA: Diagnosis not present

## 2020-07-07 DIAGNOSIS — I639 Cerebral infarction, unspecified: Secondary | ICD-10-CM | POA: Diagnosis not present

## 2020-07-07 DIAGNOSIS — R262 Difficulty in walking, not elsewhere classified: Secondary | ICD-10-CM | POA: Diagnosis not present

## 2020-07-07 DIAGNOSIS — R2689 Other abnormalities of gait and mobility: Secondary | ICD-10-CM | POA: Diagnosis not present

## 2020-07-07 DIAGNOSIS — M6281 Muscle weakness (generalized): Secondary | ICD-10-CM | POA: Diagnosis not present

## 2020-07-09 DIAGNOSIS — N390 Urinary tract infection, site not specified: Secondary | ICD-10-CM | POA: Diagnosis not present

## 2020-07-09 DIAGNOSIS — I639 Cerebral infarction, unspecified: Secondary | ICD-10-CM | POA: Diagnosis not present

## 2020-07-09 DIAGNOSIS — R634 Abnormal weight loss: Secondary | ICD-10-CM | POA: Diagnosis not present

## 2020-07-09 DIAGNOSIS — R279 Unspecified lack of coordination: Secondary | ICD-10-CM | POA: Diagnosis not present

## 2020-07-09 DIAGNOSIS — M6281 Muscle weakness (generalized): Secondary | ICD-10-CM | POA: Diagnosis not present

## 2020-07-09 DIAGNOSIS — R2681 Unsteadiness on feet: Secondary | ICD-10-CM | POA: Diagnosis not present

## 2020-07-09 DIAGNOSIS — R262 Difficulty in walking, not elsewhere classified: Secondary | ICD-10-CM | POA: Diagnosis not present

## 2020-07-09 DIAGNOSIS — R2689 Other abnormalities of gait and mobility: Secondary | ICD-10-CM | POA: Diagnosis not present

## 2020-07-11 DIAGNOSIS — I639 Cerebral infarction, unspecified: Secondary | ICD-10-CM | POA: Diagnosis not present

## 2020-07-11 DIAGNOSIS — M6281 Muscle weakness (generalized): Secondary | ICD-10-CM | POA: Diagnosis not present

## 2020-07-11 DIAGNOSIS — R634 Abnormal weight loss: Secondary | ICD-10-CM | POA: Diagnosis not present

## 2020-07-11 DIAGNOSIS — R262 Difficulty in walking, not elsewhere classified: Secondary | ICD-10-CM | POA: Diagnosis not present

## 2020-07-11 DIAGNOSIS — R2689 Other abnormalities of gait and mobility: Secondary | ICD-10-CM | POA: Diagnosis not present

## 2020-07-11 DIAGNOSIS — R279 Unspecified lack of coordination: Secondary | ICD-10-CM | POA: Diagnosis not present

## 2020-07-11 DIAGNOSIS — R2681 Unsteadiness on feet: Secondary | ICD-10-CM | POA: Diagnosis not present

## 2020-07-11 DIAGNOSIS — N390 Urinary tract infection, site not specified: Secondary | ICD-10-CM | POA: Diagnosis not present

## 2020-07-18 DIAGNOSIS — Z03818 Encounter for observation for suspected exposure to other biological agents ruled out: Secondary | ICD-10-CM | POA: Diagnosis not present

## 2020-07-23 DIAGNOSIS — N390 Urinary tract infection, site not specified: Secondary | ICD-10-CM | POA: Diagnosis not present

## 2020-07-23 DIAGNOSIS — R2689 Other abnormalities of gait and mobility: Secondary | ICD-10-CM | POA: Diagnosis not present

## 2020-07-23 DIAGNOSIS — R634 Abnormal weight loss: Secondary | ICD-10-CM | POA: Diagnosis not present

## 2020-07-23 DIAGNOSIS — R279 Unspecified lack of coordination: Secondary | ICD-10-CM | POA: Diagnosis not present

## 2020-07-23 DIAGNOSIS — I639 Cerebral infarction, unspecified: Secondary | ICD-10-CM | POA: Diagnosis not present

## 2020-07-23 DIAGNOSIS — R262 Difficulty in walking, not elsewhere classified: Secondary | ICD-10-CM | POA: Diagnosis not present

## 2020-07-23 DIAGNOSIS — R2681 Unsteadiness on feet: Secondary | ICD-10-CM | POA: Diagnosis not present

## 2020-07-23 DIAGNOSIS — M6281 Muscle weakness (generalized): Secondary | ICD-10-CM | POA: Diagnosis not present

## 2020-07-24 DIAGNOSIS — R634 Abnormal weight loss: Secondary | ICD-10-CM | POA: Diagnosis not present

## 2020-07-24 DIAGNOSIS — M6281 Muscle weakness (generalized): Secondary | ICD-10-CM | POA: Diagnosis not present

## 2020-07-24 DIAGNOSIS — N390 Urinary tract infection, site not specified: Secondary | ICD-10-CM | POA: Diagnosis not present

## 2020-07-24 DIAGNOSIS — R262 Difficulty in walking, not elsewhere classified: Secondary | ICD-10-CM | POA: Diagnosis not present

## 2020-07-24 DIAGNOSIS — R2689 Other abnormalities of gait and mobility: Secondary | ICD-10-CM | POA: Diagnosis not present

## 2020-07-24 DIAGNOSIS — R279 Unspecified lack of coordination: Secondary | ICD-10-CM | POA: Diagnosis not present

## 2020-07-24 DIAGNOSIS — R2681 Unsteadiness on feet: Secondary | ICD-10-CM | POA: Diagnosis not present

## 2020-07-24 DIAGNOSIS — I639 Cerebral infarction, unspecified: Secondary | ICD-10-CM | POA: Diagnosis not present

## 2020-07-28 DIAGNOSIS — I639 Cerebral infarction, unspecified: Secondary | ICD-10-CM | POA: Diagnosis not present

## 2020-07-28 DIAGNOSIS — R262 Difficulty in walking, not elsewhere classified: Secondary | ICD-10-CM | POA: Diagnosis not present

## 2020-07-28 DIAGNOSIS — N390 Urinary tract infection, site not specified: Secondary | ICD-10-CM | POA: Diagnosis not present

## 2020-07-28 DIAGNOSIS — R2681 Unsteadiness on feet: Secondary | ICD-10-CM | POA: Diagnosis not present

## 2020-07-28 DIAGNOSIS — R279 Unspecified lack of coordination: Secondary | ICD-10-CM | POA: Diagnosis not present

## 2020-07-28 DIAGNOSIS — M6281 Muscle weakness (generalized): Secondary | ICD-10-CM | POA: Diagnosis not present

## 2020-07-28 DIAGNOSIS — R2689 Other abnormalities of gait and mobility: Secondary | ICD-10-CM | POA: Diagnosis not present

## 2020-07-28 DIAGNOSIS — R634 Abnormal weight loss: Secondary | ICD-10-CM | POA: Diagnosis not present

## 2020-08-01 DIAGNOSIS — R2681 Unsteadiness on feet: Secondary | ICD-10-CM | POA: Diagnosis not present

## 2020-08-01 DIAGNOSIS — N390 Urinary tract infection, site not specified: Secondary | ICD-10-CM | POA: Diagnosis not present

## 2020-08-01 DIAGNOSIS — M6281 Muscle weakness (generalized): Secondary | ICD-10-CM | POA: Diagnosis not present

## 2020-08-01 DIAGNOSIS — R262 Difficulty in walking, not elsewhere classified: Secondary | ICD-10-CM | POA: Diagnosis not present

## 2020-08-01 DIAGNOSIS — R2689 Other abnormalities of gait and mobility: Secondary | ICD-10-CM | POA: Diagnosis not present

## 2020-08-01 DIAGNOSIS — I639 Cerebral infarction, unspecified: Secondary | ICD-10-CM | POA: Diagnosis not present

## 2020-08-01 DIAGNOSIS — R279 Unspecified lack of coordination: Secondary | ICD-10-CM | POA: Diagnosis not present

## 2020-08-01 DIAGNOSIS — R5381 Other malaise: Secondary | ICD-10-CM | POA: Diagnosis not present

## 2020-08-01 DIAGNOSIS — R634 Abnormal weight loss: Secondary | ICD-10-CM | POA: Diagnosis not present

## 2020-08-02 DIAGNOSIS — N390 Urinary tract infection, site not specified: Secondary | ICD-10-CM | POA: Diagnosis not present

## 2020-08-02 DIAGNOSIS — R319 Hematuria, unspecified: Secondary | ICD-10-CM | POA: Diagnosis not present

## 2020-08-04 DIAGNOSIS — R5381 Other malaise: Secondary | ICD-10-CM | POA: Diagnosis not present

## 2020-08-04 DIAGNOSIS — L608 Other nail disorders: Secondary | ICD-10-CM | POA: Diagnosis not present

## 2020-08-05 DIAGNOSIS — R2689 Other abnormalities of gait and mobility: Secondary | ICD-10-CM | POA: Diagnosis not present

## 2020-08-05 DIAGNOSIS — I639 Cerebral infarction, unspecified: Secondary | ICD-10-CM | POA: Diagnosis not present

## 2020-08-05 DIAGNOSIS — R262 Difficulty in walking, not elsewhere classified: Secondary | ICD-10-CM | POA: Diagnosis not present

## 2020-08-05 DIAGNOSIS — R634 Abnormal weight loss: Secondary | ICD-10-CM | POA: Diagnosis not present

## 2020-08-05 DIAGNOSIS — M6281 Muscle weakness (generalized): Secondary | ICD-10-CM | POA: Diagnosis not present

## 2020-08-05 DIAGNOSIS — R279 Unspecified lack of coordination: Secondary | ICD-10-CM | POA: Diagnosis not present

## 2020-08-05 DIAGNOSIS — R2681 Unsteadiness on feet: Secondary | ICD-10-CM | POA: Diagnosis not present

## 2020-08-08 DIAGNOSIS — R634 Abnormal weight loss: Secondary | ICD-10-CM | POA: Diagnosis not present

## 2020-08-08 DIAGNOSIS — R2681 Unsteadiness on feet: Secondary | ICD-10-CM | POA: Diagnosis not present

## 2020-08-08 DIAGNOSIS — R279 Unspecified lack of coordination: Secondary | ICD-10-CM | POA: Diagnosis not present

## 2020-08-08 DIAGNOSIS — M6281 Muscle weakness (generalized): Secondary | ICD-10-CM | POA: Diagnosis not present

## 2020-08-08 DIAGNOSIS — I639 Cerebral infarction, unspecified: Secondary | ICD-10-CM | POA: Diagnosis not present

## 2020-08-08 DIAGNOSIS — R262 Difficulty in walking, not elsewhere classified: Secondary | ICD-10-CM | POA: Diagnosis not present

## 2020-08-08 DIAGNOSIS — R2689 Other abnormalities of gait and mobility: Secondary | ICD-10-CM | POA: Diagnosis not present

## 2020-08-12 DIAGNOSIS — E119 Type 2 diabetes mellitus without complications: Secondary | ICD-10-CM | POA: Diagnosis not present

## 2020-08-13 ENCOUNTER — Non-Acute Institutional Stay: Payer: Medicare Other | Admitting: Adult Health Nurse Practitioner

## 2020-08-13 DIAGNOSIS — R262 Difficulty in walking, not elsewhere classified: Secondary | ICD-10-CM | POA: Diagnosis not present

## 2020-08-13 DIAGNOSIS — I6930 Unspecified sequelae of cerebral infarction: Secondary | ICD-10-CM

## 2020-08-13 DIAGNOSIS — T148XXA Other injury of unspecified body region, initial encounter: Secondary | ICD-10-CM | POA: Diagnosis not present

## 2020-08-13 DIAGNOSIS — Z515 Encounter for palliative care: Secondary | ICD-10-CM | POA: Diagnosis not present

## 2020-08-13 DIAGNOSIS — R2681 Unsteadiness on feet: Secondary | ICD-10-CM | POA: Diagnosis not present

## 2020-08-13 DIAGNOSIS — I639 Cerebral infarction, unspecified: Secondary | ICD-10-CM | POA: Diagnosis not present

## 2020-08-13 DIAGNOSIS — M6281 Muscle weakness (generalized): Secondary | ICD-10-CM | POA: Diagnosis not present

## 2020-08-13 DIAGNOSIS — R279 Unspecified lack of coordination: Secondary | ICD-10-CM | POA: Diagnosis not present

## 2020-08-13 DIAGNOSIS — R634 Abnormal weight loss: Secondary | ICD-10-CM | POA: Diagnosis not present

## 2020-08-13 DIAGNOSIS — R2689 Other abnormalities of gait and mobility: Secondary | ICD-10-CM | POA: Diagnosis not present

## 2020-08-13 NOTE — Progress Notes (Addendum)
[facility] Consult Note Telephone: [phone]  Fax: [phone]  PATIENT NAME: Lori Berger DOB: Dec 19, 1940 MRN: [id number]  PRIMARY CARE PROVIDER:   Dr. Maryella Shivers  REFERRING PROVIDER:  Roddie Mc PA/ Dr. Nyra Capes  RESPONSIBLE PARTY:  Chastelyn Athens, son H: [phone] C: [phone] jordie skalsky, daughter-in-law [phone]   RECOMMENDATIONS and PLAN: 1.Advanced care planning. Patient is DNR/comfort measures. will call son and daughter-in-law to update on visit.  2.  Cognitive impairment.  Patient has forgetfulness and confusion.  She is alert and oriented to person and place.  Staff reports that she is having increased sundowning around 4-5pm.  She will pack up a bag and start wandering the halls to" leave to go home."  She is currently on lexapro and remeron.  Have spoken with PA at facility and recommended something like seroquel to be given in the afternoon to help with the sundowning.    3.  Functional status.  Patient is wheelchair-bound.  She is able to walk short distances with a walker.  She does not walk with a walker unassisted.  she had a fall on 06/14/20 with scraps to left wrist and right arm. No major injury due to the fall.  Does require assistance with ADLs but is able to feed herself.  Patient's weight has been stable around 143-146.  Patient is incontinent of bowel and bladder.  Continue supportive care at facility.  No hospital visits since last visit.  Has stable DOE.  Denies fever, increased shortness of breath or cough, N/V/D, constipation.  Palliative will continue to monitor for symptom management/decline and make recommendations as needed.  Will follow up in 6 to 8 weeks.  I spent 30 minutes providing this consultation,  from 10:30 to 11:00 including time spent with patient/family, chart review, provider coordination, documentation. More than 50% of the time in this consultation was spent coordinating  communication.   HISTORY OF PRESENT ILLNESS:  Lori Berger is a 79 y.o. year old female with multiple medical problems including dementia, DMT2, HTN, HLD, depression, frequent falls, recurrent UTIs, h/o CVA. Palliative Care was asked to help address goals of care.   CODE STATUS: DNR/comfort measures  PPS: 30% HOSPICE ELIGIBILITY/DIAGNOSIS: TBD  PHYSICAL EXAM:  HR 96 O2 98% on RA General: NAD, frail appearing, thin Cardiovascular: regular rate and rhythm Pulmonary: lung sounds clear; normal respiratory effort Abdomen: soft, nontender, + bowel sounds GU: no suprapubic tenderness Extremities: no edema, no joint deformities Skin: no rashes on exposed skin Neurological: Weakness; A&O to person and place  PAST MEDICAL HISTORY:  Past Medical History:  Diagnosis Date  . Arthritis   . BCC (basal cell carcinoma)   . Breast cancer ([place]) 1990   left 1990  . Breast cancer ([place]) 11/11/2015   Right, T2 (2.4 cm(, N0; positive deep margin, ER: 100%, PR: 60%; Her 2 neu not amplified, Mammoprint: low risk  . Diabetes mellitus without complication ([place])   . Endometrial cancer ([place])   . Hyperlipidemia   . Hypertension   . Major depressive disorder   . PONV (postoperative nausea and vomiting)   . UTI (urinary tract infection)   . Yeast infection     SOCIAL HX:  Social History   Tobacco Use  . Smoking status: Never Smoker  . Smokeless tobacco: Never Used  Substance Use Topics  . Alcohol use: No    ALLERGIES:  Allergies  Allergen Reactions  . Penicillins      PERTINENT MEDICATIONS:  Outpatient Encounter Medications as of 08/13/2020  Medication Sig  . ascorbic acid (VITAMIN C) 500 MG tablet Take 1 tablet (500 mg total) by mouth daily.  Marland Kitchen aspirin EC 81 MG tablet Take 1 tablet (81 mg total) by mouth daily.  . bisacodyl (DULCOLAX) 5 MG EC tablet Take 1 tablet (5 mg total) by mouth daily as needed for moderate constipation. (Patient not taking: Reported on 01/28/2020)  . Calcium-Vitamin  D-Vitamin K 023-343-56 MG-UNT-MCG TABS Take 1 tablet by mouth daily.  . Canagliflozin-metFORMIN HCl (INVOKAMET) 150-500 MG TABS Take 1 tablet by mouth 2 (two) times daily.  . clopidogrel (PLAVIX) 75 MG tablet TAKE 1 TABLET BY MOUTH  DAILY (Patient taking differently: Take 75 mg by mouth daily. )  . ELIQUIS 5 MG TABS tablet Take 5 mg by mouth 2 (two) times daily.  Marland Kitchen escitalopram (LEXAPRO) 10 MG tablet Take 10 mg by mouth daily.  . fluconazole (DIFLUCAN) 200 MG tablet Take 200 mg by mouth daily.  Marland Kitchen glucose blood test strip Check sugar daily  . guaiFENesin-dextromethorphan (ROBITUSSIN DM) 100-10 MG/5ML syrup Take 10 mLs by mouth every 4 (four) hours as needed for cough.  . insulin aspart (NOVOLOG) 100 UNIT/ML injection Inject 6 Units into the skin 3 (three) times daily with meals for 5 days.  . Ipratropium-Albuterol (COMBIVENT) 20-100 MCG/ACT AERS respimat Inhale 1 puff into the lungs every 6 (six) hours. (Patient not taking: Reported on 01/28/2020)  . letrozole (FEMARA) 2.5 MG tablet TAKE 1 TABLET BY MOUTH  DAILY (Patient taking differently: Take 2.5 mg by mouth daily. )  . mirtazapine (REMERON) 15 MG tablet Take 1 tablet (15 mg total) by mouth at bedtime.  . Multiple Vitamin (MULTIVITAMIN WITH MINERALS) TABS tablet Take 1 tablet by mouth daily. (Patient not taking: Reported on 01/28/2020)  . Nutritional Supplements (FEEDING SUPPLEMENT, NEPRO CARB STEADY,) LIQD Take 237 mLs by mouth 3 (three) times daily between meals.  . nystatin cream (MYCOSTATIN) Apply 1 application topically 2 (two) times daily. (Patient not taking: Reported on 01/28/2020)  . ondansetron (ZOFRAN) 4 MG tablet Take 1 tablet (4 mg total) by mouth every 6 (six) hours as needed for nausea. (Patient not taking: Reported on 01/28/2020)  . simvastatin (ZOCOR) 10 MG tablet TAKE 1 TABLET BY MOUTH AT  BEDTIME (Patient taking differently: Take 10 mg by mouth at bedtime. )  . traZODone (DESYREL) 50 MG tablet Take 0.5 tablets (25 mg total) by mouth  at bedtime as needed for sleep.  Marland Kitchen zinc sulfate 220 (50 Zn) MG capsule Take 1 capsule (220 mg total) by mouth daily. (Patient not taking: Reported on 01/28/2020)   No facility-administered encounter medications on file as of 08/13/2020.     Kiaya Haliburton Jenetta Downer, NP

## 2020-08-14 ENCOUNTER — Other Ambulatory Visit: Payer: Self-pay

## 2020-08-14 ENCOUNTER — Telehealth: Payer: Self-pay | Admitting: Adult Health Nurse Practitioner

## 2020-08-14 NOTE — Telephone Encounter (Signed)
Called to update on visit with her mother.   Left VM with reason for call and call back info Anaalicia Reimann K. Olena Heckle NP

## 2020-08-14 NOTE — Telephone Encounter (Signed)
Daughter in law called back and was able to update on visit with her mother in law.  Encouraged to call with any questions or concerns Chanin Frumkin K. Olena Heckle NP

## 2020-08-15 DIAGNOSIS — M6281 Muscle weakness (generalized): Secondary | ICD-10-CM | POA: Diagnosis not present

## 2020-08-15 DIAGNOSIS — R2689 Other abnormalities of gait and mobility: Secondary | ICD-10-CM | POA: Diagnosis not present

## 2020-08-15 DIAGNOSIS — Z03818 Encounter for observation for suspected exposure to other biological agents ruled out: Secondary | ICD-10-CM | POA: Diagnosis not present

## 2020-08-15 DIAGNOSIS — R634 Abnormal weight loss: Secondary | ICD-10-CM | POA: Diagnosis not present

## 2020-08-15 DIAGNOSIS — R262 Difficulty in walking, not elsewhere classified: Secondary | ICD-10-CM | POA: Diagnosis not present

## 2020-08-15 DIAGNOSIS — R279 Unspecified lack of coordination: Secondary | ICD-10-CM | POA: Diagnosis not present

## 2020-08-15 DIAGNOSIS — I639 Cerebral infarction, unspecified: Secondary | ICD-10-CM | POA: Diagnosis not present

## 2020-08-15 DIAGNOSIS — R2681 Unsteadiness on feet: Secondary | ICD-10-CM | POA: Diagnosis not present

## 2020-08-19 DIAGNOSIS — R2689 Other abnormalities of gait and mobility: Secondary | ICD-10-CM | POA: Diagnosis not present

## 2020-08-19 DIAGNOSIS — I639 Cerebral infarction, unspecified: Secondary | ICD-10-CM | POA: Diagnosis not present

## 2020-08-19 DIAGNOSIS — E46 Unspecified protein-calorie malnutrition: Secondary | ICD-10-CM | POA: Diagnosis not present

## 2020-08-19 DIAGNOSIS — R262 Difficulty in walking, not elsewhere classified: Secondary | ICD-10-CM | POA: Diagnosis not present

## 2020-08-19 DIAGNOSIS — Z853 Personal history of malignant neoplasm of breast: Secondary | ICD-10-CM | POA: Diagnosis not present

## 2020-08-19 DIAGNOSIS — M6281 Muscle weakness (generalized): Secondary | ICD-10-CM | POA: Diagnosis not present

## 2020-08-19 DIAGNOSIS — R634 Abnormal weight loss: Secondary | ICD-10-CM | POA: Diagnosis not present

## 2020-08-19 DIAGNOSIS — R279 Unspecified lack of coordination: Secondary | ICD-10-CM | POA: Diagnosis not present

## 2020-08-19 DIAGNOSIS — R2681 Unsteadiness on feet: Secondary | ICD-10-CM | POA: Diagnosis not present

## 2020-08-19 DIAGNOSIS — R1312 Dysphagia, oropharyngeal phase: Secondary | ICD-10-CM | POA: Diagnosis not present

## 2020-08-22 DIAGNOSIS — R2681 Unsteadiness on feet: Secondary | ICD-10-CM | POA: Diagnosis not present

## 2020-08-22 DIAGNOSIS — R2689 Other abnormalities of gait and mobility: Secondary | ICD-10-CM | POA: Diagnosis not present

## 2020-08-22 DIAGNOSIS — I639 Cerebral infarction, unspecified: Secondary | ICD-10-CM | POA: Diagnosis not present

## 2020-08-22 DIAGNOSIS — R262 Difficulty in walking, not elsewhere classified: Secondary | ICD-10-CM | POA: Diagnosis not present

## 2020-08-22 DIAGNOSIS — R634 Abnormal weight loss: Secondary | ICD-10-CM | POA: Diagnosis not present

## 2020-08-22 DIAGNOSIS — R279 Unspecified lack of coordination: Secondary | ICD-10-CM | POA: Diagnosis not present

## 2020-08-22 DIAGNOSIS — M6281 Muscle weakness (generalized): Secondary | ICD-10-CM | POA: Diagnosis not present

## 2020-08-26 DIAGNOSIS — R2689 Other abnormalities of gait and mobility: Secondary | ICD-10-CM | POA: Diagnosis not present

## 2020-08-26 DIAGNOSIS — M6281 Muscle weakness (generalized): Secondary | ICD-10-CM | POA: Diagnosis not present

## 2020-08-26 DIAGNOSIS — R262 Difficulty in walking, not elsewhere classified: Secondary | ICD-10-CM | POA: Diagnosis not present

## 2020-08-26 DIAGNOSIS — R279 Unspecified lack of coordination: Secondary | ICD-10-CM | POA: Diagnosis not present

## 2020-08-26 DIAGNOSIS — R634 Abnormal weight loss: Secondary | ICD-10-CM | POA: Diagnosis not present

## 2020-08-26 DIAGNOSIS — R2681 Unsteadiness on feet: Secondary | ICD-10-CM | POA: Diagnosis not present

## 2020-08-26 DIAGNOSIS — I639 Cerebral infarction, unspecified: Secondary | ICD-10-CM | POA: Diagnosis not present

## 2020-08-29 DIAGNOSIS — R634 Abnormal weight loss: Secondary | ICD-10-CM | POA: Diagnosis not present

## 2020-08-29 DIAGNOSIS — R2681 Unsteadiness on feet: Secondary | ICD-10-CM | POA: Diagnosis not present

## 2020-08-29 DIAGNOSIS — R279 Unspecified lack of coordination: Secondary | ICD-10-CM | POA: Diagnosis not present

## 2020-08-29 DIAGNOSIS — I639 Cerebral infarction, unspecified: Secondary | ICD-10-CM | POA: Diagnosis not present

## 2020-08-29 DIAGNOSIS — R262 Difficulty in walking, not elsewhere classified: Secondary | ICD-10-CM | POA: Diagnosis not present

## 2020-08-29 DIAGNOSIS — R2689 Other abnormalities of gait and mobility: Secondary | ICD-10-CM | POA: Diagnosis not present

## 2020-08-29 DIAGNOSIS — M6281 Muscle weakness (generalized): Secondary | ICD-10-CM | POA: Diagnosis not present

## 2020-09-02 DIAGNOSIS — I639 Cerebral infarction, unspecified: Secondary | ICD-10-CM | POA: Diagnosis not present

## 2020-09-02 DIAGNOSIS — R634 Abnormal weight loss: Secondary | ICD-10-CM | POA: Diagnosis not present

## 2020-09-02 DIAGNOSIS — M6281 Muscle weakness (generalized): Secondary | ICD-10-CM | POA: Diagnosis not present

## 2020-09-02 DIAGNOSIS — R2689 Other abnormalities of gait and mobility: Secondary | ICD-10-CM | POA: Diagnosis not present

## 2020-09-02 DIAGNOSIS — R262 Difficulty in walking, not elsewhere classified: Secondary | ICD-10-CM | POA: Diagnosis not present

## 2020-09-02 DIAGNOSIS — R2681 Unsteadiness on feet: Secondary | ICD-10-CM | POA: Diagnosis not present

## 2020-09-02 DIAGNOSIS — R279 Unspecified lack of coordination: Secondary | ICD-10-CM | POA: Diagnosis not present

## 2020-09-04 ENCOUNTER — Non-Acute Institutional Stay: Payer: Medicare Other | Admitting: Adult Health Nurse Practitioner

## 2020-09-04 ENCOUNTER — Other Ambulatory Visit: Payer: Self-pay

## 2020-09-04 DIAGNOSIS — F0150 Vascular dementia without behavioral disturbance: Secondary | ICD-10-CM

## 2020-09-04 DIAGNOSIS — I6930 Unspecified sequelae of cerebral infarction: Secondary | ICD-10-CM

## 2020-09-04 DIAGNOSIS — Z515 Encounter for palliative care: Secondary | ICD-10-CM | POA: Diagnosis not present

## 2020-09-04 NOTE — Progress Notes (Signed)
[place] Consult Note Telephone: [phone]  Fax: [phone]  PATIENT NAME: Lori Berger DOB: 16-Sep-1941 MRN: [id number]  PRIMARY CARE PROVIDER:  Dr. Maryella Shivers  REFERRING PROVIDER:  Roddie Mc PA/ Dr. Nyra Capes  RESPONSIBLE PARTY:  Cornella Emmer, son H: [phone] C: [phone] genesi stefanko, daughter-in-law [phone]  Chief complaint:  Increased confusion/agitation   RECOMMENDATIONS and PLAN: 1.Advanced care planning. Patient is DNR/comfort measures.  2. Vascular dementia.  Patient having agitation/sundowning related to vascular dementia.  She apparently had a bad day but appears to be back at baseline today.  Provider at facility did order UA to rule out UTI.  Discussed with DIL that her MIL will have good days and bad days with her dementia and that she is being evaluated for UTI.  Will continue to monitor for disease progression.  Continue supportive care at facility.  No falls or hospital visits since last visit.  Palliative will continue to monitor for symptom management/decline and make recommendations as needed.  Will follow up in 6-8 weeks.  Family encouraged to call with any questions or concerns  I spent 40 minutes providing this consultation. More than 50% of the time in this consultation was spent coordinating communication.   HISTORY OF PRESENT ILLNESS:  Lori Berger is a 79 y.o. year old female with multiple medical problems including  dementia, DMT2, HTN, HLD, depression, frequent falls, recurrent UTIs, h/o CVA. Palliative Care was asked to help address goals of care.  Daughter in law called today requesting an evaluation of her mother in law.  States that she visited her yesterday and she was having increased confusion and was complaining of pain but was not able to say where or what kind of pain.  States that was not recognizing loved ones she usually recognizes.  Did state that by the end of their  visit that was around 1.5 hours she was more back to baseline.  Today patient is able to tell this provider the year, month, her birth date, but stated she was at her previous ALF instead at the SNF she resides.  She has no new complaints. Staff reports that she is at baseline and has no new concerns.  She does have sundowning in late afternoon. Provider at facility states that about 3 weeks ago psych tried to reduce her lexapro and she started having increased agitation and he increased it back.  10 point ROS asked and negative.  CODE STATUS: DNR/comfort measures  PPS: 30%  HOSPICE ELIGIBILITY/DIAGNOSIS: TBD  PHYSICAL EXAM: HR 70 O2 98% on RA General: NAD, frail appearing, thin Cardiovascular: regular rate and rhythm Pulmonary:lung sounds clear; normal respiratory effort Abdomen: soft, nontender, + bowel sounds GU: no suprapubic tenderness Extremities: no edema, no joint deformities Skin: no rasheson exposed skin Neurological: Weakness; A&O to person and time.  Knew she was in a facility but stated the ALF she used to reside   [place]:  Past Medical History:  Diagnosis Date   Arthritis    BCC (basal cell carcinoma)    Breast cancer ([place]) 1990   left 1990   Breast cancer ([place]) 11/11/2015   Right, T2 (2.4 cm(, N0; positive deep margin, ER: 100%, PR: 60%; Her 2 neu not amplified, Mammoprint: low risk   Diabetes mellitus without complication (HCC)    Endometrial cancer (HCC)    Hyperlipidemia    Hypertension    Major depressive disorder    PONV (postoperative nausea and vomiting)  UTI (urinary tract infection)    Yeast infection     SOCIAL HX:  Social History   Tobacco Use   Smoking status: Never Smoker   Smokeless tobacco: Never Used  Substance Use Topics   Alcohol use: No    ALLERGIES:  Allergies  Allergen Reactions   Penicillins      PERTINENT MEDICATIONS:  Outpatient Encounter Medications as of 09/04/2020  Medication Sig    ascorbic acid (VITAMIN C) 500 MG tablet Take 1 tablet (500 mg total) by mouth daily.   aspirin EC 81 MG tablet Take 1 tablet (81 mg total) by mouth daily.   bisacodyl (DULCOLAX) 5 MG EC tablet Take 1 tablet (5 mg total) by mouth daily as needed for moderate constipation. (Patient not taking: Reported on 01/28/2020)   Calcium-Vitamin D-Vitamin K 750-500-40 MG-UNT-MCG TABS Take 1 tablet by mouth daily.   Canagliflozin-metFORMIN HCl (INVOKAMET) 150-500 MG TABS Take 1 tablet by mouth 2 (two) times daily.   clopidogrel (PLAVIX) 75 MG tablet TAKE 1 TABLET BY MOUTH  DAILY (Patient taking differently: Take 75 mg by mouth daily. )   ELIQUIS 5 MG TABS tablet Take 5 mg by mouth 2 (two) times daily.   escitalopram (LEXAPRO) 10 MG tablet Take 10 mg by mouth daily.   fluconazole (DIFLUCAN) 200 MG tablet Take 200 mg by mouth daily.   glucose blood test strip Check sugar daily   guaiFENesin-dextromethorphan (ROBITUSSIN DM) 100-10 MG/5ML syrup Take 10 mLs by mouth every 4 (four) hours as needed for cough.   insulin aspart (NOVOLOG) 100 UNIT/ML injection Inject 6 Units into the skin 3 (three) times daily with meals for 5 days.   Ipratropium-Albuterol (COMBIVENT) 20-100 MCG/ACT AERS respimat Inhale 1 puff into the lungs every 6 (six) hours. (Patient not taking: Reported on 01/28/2020)   letrozole (FEMARA) 2.5 MG tablet TAKE 1 TABLET BY MOUTH  DAILY (Patient taking differently: Take 2.5 mg by mouth daily. )   mirtazapine (REMERON) 15 MG tablet Take 1 tablet (15 mg total) by mouth at bedtime.   Multiple Vitamin (MULTIVITAMIN WITH MINERALS) TABS tablet Take 1 tablet by mouth daily. (Patient not taking: Reported on 01/28/2020)   Nutritional Supplements (FEEDING SUPPLEMENT, NEPRO CARB STEADY,) LIQD Take 237 mLs by mouth 3 (three) times daily between meals.   nystatin cream (MYCOSTATIN) Apply 1 application topically 2 (two) times daily. (Patient not taking: Reported on 01/28/2020)   ondansetron (ZOFRAN) 4 MG  tablet Take 1 tablet (4 mg total) by mouth every 6 (six) hours as needed for nausea. (Patient not taking: Reported on 01/28/2020)   simvastatin (ZOCOR) 10 MG tablet TAKE 1 TABLET BY MOUTH AT  BEDTIME (Patient taking differently: Take 10 mg by mouth at bedtime. )   traZODone (DESYREL) 50 MG tablet Take 0.5 tablets (25 mg total) by mouth at bedtime as needed for sleep.   zinc sulfate 220 (50 Zn) MG capsule Take 1 capsule (220 mg total) by mouth daily. (Patient not taking: Reported on 01/28/2020)   No facility-administered encounter medications on file as of 09/04/2020.     Shahab Polhamus Jenetta Downer, NP

## 2020-09-05 DIAGNOSIS — I639 Cerebral infarction, unspecified: Secondary | ICD-10-CM | POA: Diagnosis not present

## 2020-09-05 DIAGNOSIS — R262 Difficulty in walking, not elsewhere classified: Secondary | ICD-10-CM | POA: Diagnosis not present

## 2020-09-05 DIAGNOSIS — R2689 Other abnormalities of gait and mobility: Secondary | ICD-10-CM | POA: Diagnosis not present

## 2020-09-05 DIAGNOSIS — R2681 Unsteadiness on feet: Secondary | ICD-10-CM | POA: Diagnosis not present

## 2020-09-05 DIAGNOSIS — M6281 Muscle weakness (generalized): Secondary | ICD-10-CM | POA: Diagnosis not present

## 2020-09-05 DIAGNOSIS — R634 Abnormal weight loss: Secondary | ICD-10-CM | POA: Diagnosis not present

## 2020-09-05 DIAGNOSIS — R279 Unspecified lack of coordination: Secondary | ICD-10-CM | POA: Diagnosis not present

## 2020-09-09 DIAGNOSIS — R279 Unspecified lack of coordination: Secondary | ICD-10-CM | POA: Diagnosis not present

## 2020-09-09 DIAGNOSIS — I639 Cerebral infarction, unspecified: Secondary | ICD-10-CM | POA: Diagnosis not present

## 2020-09-09 DIAGNOSIS — R2681 Unsteadiness on feet: Secondary | ICD-10-CM | POA: Diagnosis not present

## 2020-09-09 DIAGNOSIS — R634 Abnormal weight loss: Secondary | ICD-10-CM | POA: Diagnosis not present

## 2020-09-09 DIAGNOSIS — R262 Difficulty in walking, not elsewhere classified: Secondary | ICD-10-CM | POA: Diagnosis not present

## 2020-09-09 DIAGNOSIS — M6281 Muscle weakness (generalized): Secondary | ICD-10-CM | POA: Diagnosis not present

## 2020-09-09 DIAGNOSIS — R2689 Other abnormalities of gait and mobility: Secondary | ICD-10-CM | POA: Diagnosis not present

## 2020-09-11 DIAGNOSIS — R2689 Other abnormalities of gait and mobility: Secondary | ICD-10-CM | POA: Diagnosis not present

## 2020-09-11 DIAGNOSIS — R279 Unspecified lack of coordination: Secondary | ICD-10-CM | POA: Diagnosis not present

## 2020-09-11 DIAGNOSIS — R634 Abnormal weight loss: Secondary | ICD-10-CM | POA: Diagnosis not present

## 2020-09-11 DIAGNOSIS — I639 Cerebral infarction, unspecified: Secondary | ICD-10-CM | POA: Diagnosis not present

## 2020-09-11 DIAGNOSIS — R2681 Unsteadiness on feet: Secondary | ICD-10-CM | POA: Diagnosis not present

## 2020-09-11 DIAGNOSIS — M6281 Muscle weakness (generalized): Secondary | ICD-10-CM | POA: Diagnosis not present

## 2020-09-11 DIAGNOSIS — R262 Difficulty in walking, not elsewhere classified: Secondary | ICD-10-CM | POA: Diagnosis not present

## 2020-09-16 DIAGNOSIS — R279 Unspecified lack of coordination: Secondary | ICD-10-CM | POA: Diagnosis not present

## 2020-09-16 DIAGNOSIS — R2681 Unsteadiness on feet: Secondary | ICD-10-CM | POA: Diagnosis not present

## 2020-09-16 DIAGNOSIS — R2689 Other abnormalities of gait and mobility: Secondary | ICD-10-CM | POA: Diagnosis not present

## 2020-09-16 DIAGNOSIS — M6281 Muscle weakness (generalized): Secondary | ICD-10-CM | POA: Diagnosis not present

## 2020-09-16 DIAGNOSIS — R634 Abnormal weight loss: Secondary | ICD-10-CM | POA: Diagnosis not present

## 2020-09-16 DIAGNOSIS — I639 Cerebral infarction, unspecified: Secondary | ICD-10-CM | POA: Diagnosis not present

## 2020-09-16 DIAGNOSIS — R262 Difficulty in walking, not elsewhere classified: Secondary | ICD-10-CM | POA: Diagnosis not present

## 2020-09-18 DIAGNOSIS — E46 Unspecified protein-calorie malnutrition: Secondary | ICD-10-CM | POA: Diagnosis not present

## 2020-09-18 DIAGNOSIS — Z853 Personal history of malignant neoplasm of breast: Secondary | ICD-10-CM | POA: Diagnosis not present

## 2020-09-18 DIAGNOSIS — M6281 Muscle weakness (generalized): Secondary | ICD-10-CM | POA: Diagnosis not present

## 2020-09-19 DIAGNOSIS — I639 Cerebral infarction, unspecified: Secondary | ICD-10-CM | POA: Diagnosis not present

## 2020-09-19 DIAGNOSIS — R2689 Other abnormalities of gait and mobility: Secondary | ICD-10-CM | POA: Diagnosis not present

## 2020-09-19 DIAGNOSIS — R262 Difficulty in walking, not elsewhere classified: Secondary | ICD-10-CM | POA: Diagnosis not present

## 2020-09-19 DIAGNOSIS — R279 Unspecified lack of coordination: Secondary | ICD-10-CM | POA: Diagnosis not present

## 2020-09-19 DIAGNOSIS — M6281 Muscle weakness (generalized): Secondary | ICD-10-CM | POA: Diagnosis not present

## 2020-09-19 DIAGNOSIS — R634 Abnormal weight loss: Secondary | ICD-10-CM | POA: Diagnosis not present

## 2020-09-19 DIAGNOSIS — R2681 Unsteadiness on feet: Secondary | ICD-10-CM | POA: Diagnosis not present

## 2020-09-23 DIAGNOSIS — R262 Difficulty in walking, not elsewhere classified: Secondary | ICD-10-CM | POA: Diagnosis not present

## 2020-09-23 DIAGNOSIS — R634 Abnormal weight loss: Secondary | ICD-10-CM | POA: Diagnosis not present

## 2020-09-23 DIAGNOSIS — M6281 Muscle weakness (generalized): Secondary | ICD-10-CM | POA: Diagnosis not present

## 2020-09-23 DIAGNOSIS — R2689 Other abnormalities of gait and mobility: Secondary | ICD-10-CM | POA: Diagnosis not present

## 2020-09-23 DIAGNOSIS — I639 Cerebral infarction, unspecified: Secondary | ICD-10-CM | POA: Diagnosis not present

## 2020-09-23 DIAGNOSIS — R2681 Unsteadiness on feet: Secondary | ICD-10-CM | POA: Diagnosis not present

## 2020-09-23 DIAGNOSIS — R279 Unspecified lack of coordination: Secondary | ICD-10-CM | POA: Diagnosis not present

## 2020-09-24 DIAGNOSIS — R2681 Unsteadiness on feet: Secondary | ICD-10-CM | POA: Diagnosis not present

## 2020-09-24 DIAGNOSIS — R262 Difficulty in walking, not elsewhere classified: Secondary | ICD-10-CM | POA: Diagnosis not present

## 2020-09-24 DIAGNOSIS — M6281 Muscle weakness (generalized): Secondary | ICD-10-CM | POA: Diagnosis not present

## 2020-09-24 DIAGNOSIS — I639 Cerebral infarction, unspecified: Secondary | ICD-10-CM | POA: Diagnosis not present

## 2020-09-24 DIAGNOSIS — R2689 Other abnormalities of gait and mobility: Secondary | ICD-10-CM | POA: Diagnosis not present

## 2020-09-24 DIAGNOSIS — R634 Abnormal weight loss: Secondary | ICD-10-CM | POA: Diagnosis not present

## 2020-09-24 DIAGNOSIS — R279 Unspecified lack of coordination: Secondary | ICD-10-CM | POA: Diagnosis not present

## 2020-09-25 DIAGNOSIS — R279 Unspecified lack of coordination: Secondary | ICD-10-CM | POA: Diagnosis not present

## 2020-09-25 DIAGNOSIS — R262 Difficulty in walking, not elsewhere classified: Secondary | ICD-10-CM | POA: Diagnosis not present

## 2020-09-25 DIAGNOSIS — R634 Abnormal weight loss: Secondary | ICD-10-CM | POA: Diagnosis not present

## 2020-09-25 DIAGNOSIS — M6281 Muscle weakness (generalized): Secondary | ICD-10-CM | POA: Diagnosis not present

## 2020-09-25 DIAGNOSIS — I639 Cerebral infarction, unspecified: Secondary | ICD-10-CM | POA: Diagnosis not present

## 2020-09-25 DIAGNOSIS — R2689 Other abnormalities of gait and mobility: Secondary | ICD-10-CM | POA: Diagnosis not present

## 2020-09-25 DIAGNOSIS — R2681 Unsteadiness on feet: Secondary | ICD-10-CM | POA: Diagnosis not present

## 2020-09-26 DIAGNOSIS — R634 Abnormal weight loss: Secondary | ICD-10-CM | POA: Diagnosis not present

## 2020-09-26 DIAGNOSIS — R2681 Unsteadiness on feet: Secondary | ICD-10-CM | POA: Diagnosis not present

## 2020-09-26 DIAGNOSIS — M6281 Muscle weakness (generalized): Secondary | ICD-10-CM | POA: Diagnosis not present

## 2020-09-26 DIAGNOSIS — R279 Unspecified lack of coordination: Secondary | ICD-10-CM | POA: Diagnosis not present

## 2020-09-26 DIAGNOSIS — R2689 Other abnormalities of gait and mobility: Secondary | ICD-10-CM | POA: Diagnosis not present

## 2020-09-26 DIAGNOSIS — R262 Difficulty in walking, not elsewhere classified: Secondary | ICD-10-CM | POA: Diagnosis not present

## 2020-09-26 DIAGNOSIS — I639 Cerebral infarction, unspecified: Secondary | ICD-10-CM | POA: Diagnosis not present

## 2020-09-29 DIAGNOSIS — R634 Abnormal weight loss: Secondary | ICD-10-CM | POA: Diagnosis not present

## 2020-09-29 DIAGNOSIS — R2689 Other abnormalities of gait and mobility: Secondary | ICD-10-CM | POA: Diagnosis not present

## 2020-09-29 DIAGNOSIS — M6281 Muscle weakness (generalized): Secondary | ICD-10-CM | POA: Diagnosis not present

## 2020-09-29 DIAGNOSIS — R262 Difficulty in walking, not elsewhere classified: Secondary | ICD-10-CM | POA: Diagnosis not present

## 2020-09-29 DIAGNOSIS — I639 Cerebral infarction, unspecified: Secondary | ICD-10-CM | POA: Diagnosis not present

## 2020-09-29 DIAGNOSIS — R2681 Unsteadiness on feet: Secondary | ICD-10-CM | POA: Diagnosis not present

## 2020-09-29 DIAGNOSIS — R279 Unspecified lack of coordination: Secondary | ICD-10-CM | POA: Diagnosis not present

## 2020-09-30 DIAGNOSIS — M6281 Muscle weakness (generalized): Secondary | ICD-10-CM | POA: Diagnosis not present

## 2020-09-30 DIAGNOSIS — I639 Cerebral infarction, unspecified: Secondary | ICD-10-CM | POA: Diagnosis not present

## 2020-09-30 DIAGNOSIS — R2689 Other abnormalities of gait and mobility: Secondary | ICD-10-CM | POA: Diagnosis not present

## 2020-09-30 DIAGNOSIS — R279 Unspecified lack of coordination: Secondary | ICD-10-CM | POA: Diagnosis not present

## 2020-09-30 DIAGNOSIS — R634 Abnormal weight loss: Secondary | ICD-10-CM | POA: Diagnosis not present

## 2020-09-30 DIAGNOSIS — R2681 Unsteadiness on feet: Secondary | ICD-10-CM | POA: Diagnosis not present

## 2020-09-30 DIAGNOSIS — R262 Difficulty in walking, not elsewhere classified: Secondary | ICD-10-CM | POA: Diagnosis not present

## 2020-10-01 DIAGNOSIS — R2681 Unsteadiness on feet: Secondary | ICD-10-CM | POA: Diagnosis not present

## 2020-10-01 DIAGNOSIS — R2689 Other abnormalities of gait and mobility: Secondary | ICD-10-CM | POA: Diagnosis not present

## 2020-10-01 DIAGNOSIS — R634 Abnormal weight loss: Secondary | ICD-10-CM | POA: Diagnosis not present

## 2020-10-01 DIAGNOSIS — M6281 Muscle weakness (generalized): Secondary | ICD-10-CM | POA: Diagnosis not present

## 2020-10-01 DIAGNOSIS — R279 Unspecified lack of coordination: Secondary | ICD-10-CM | POA: Diagnosis not present

## 2020-10-01 DIAGNOSIS — R262 Difficulty in walking, not elsewhere classified: Secondary | ICD-10-CM | POA: Diagnosis not present

## 2020-10-01 DIAGNOSIS — I639 Cerebral infarction, unspecified: Secondary | ICD-10-CM | POA: Diagnosis not present

## 2020-10-02 DIAGNOSIS — R2689 Other abnormalities of gait and mobility: Secondary | ICD-10-CM | POA: Diagnosis not present

## 2020-10-02 DIAGNOSIS — I639 Cerebral infarction, unspecified: Secondary | ICD-10-CM | POA: Diagnosis not present

## 2020-10-02 DIAGNOSIS — R634 Abnormal weight loss: Secondary | ICD-10-CM | POA: Diagnosis not present

## 2020-10-02 DIAGNOSIS — R262 Difficulty in walking, not elsewhere classified: Secondary | ICD-10-CM | POA: Diagnosis not present

## 2020-10-02 DIAGNOSIS — R279 Unspecified lack of coordination: Secondary | ICD-10-CM | POA: Diagnosis not present

## 2020-10-02 DIAGNOSIS — R2681 Unsteadiness on feet: Secondary | ICD-10-CM | POA: Diagnosis not present

## 2020-10-02 DIAGNOSIS — M6281 Muscle weakness (generalized): Secondary | ICD-10-CM | POA: Diagnosis not present

## 2020-10-03 DIAGNOSIS — R2681 Unsteadiness on feet: Secondary | ICD-10-CM | POA: Diagnosis not present

## 2020-10-03 DIAGNOSIS — R634 Abnormal weight loss: Secondary | ICD-10-CM | POA: Diagnosis not present

## 2020-10-03 DIAGNOSIS — I639 Cerebral infarction, unspecified: Secondary | ICD-10-CM | POA: Diagnosis not present

## 2020-10-03 DIAGNOSIS — R262 Difficulty in walking, not elsewhere classified: Secondary | ICD-10-CM | POA: Diagnosis not present

## 2020-10-03 DIAGNOSIS — R2689 Other abnormalities of gait and mobility: Secondary | ICD-10-CM | POA: Diagnosis not present

## 2020-10-03 DIAGNOSIS — M6281 Muscle weakness (generalized): Secondary | ICD-10-CM | POA: Diagnosis not present

## 2020-10-03 DIAGNOSIS — R279 Unspecified lack of coordination: Secondary | ICD-10-CM | POA: Diagnosis not present

## 2020-10-06 DIAGNOSIS — R279 Unspecified lack of coordination: Secondary | ICD-10-CM | POA: Diagnosis not present

## 2020-10-06 DIAGNOSIS — R262 Difficulty in walking, not elsewhere classified: Secondary | ICD-10-CM | POA: Diagnosis not present

## 2020-10-06 DIAGNOSIS — M6281 Muscle weakness (generalized): Secondary | ICD-10-CM | POA: Diagnosis not present

## 2020-10-06 DIAGNOSIS — R2689 Other abnormalities of gait and mobility: Secondary | ICD-10-CM | POA: Diagnosis not present

## 2020-10-06 DIAGNOSIS — R2681 Unsteadiness on feet: Secondary | ICD-10-CM | POA: Diagnosis not present

## 2020-10-06 DIAGNOSIS — I639 Cerebral infarction, unspecified: Secondary | ICD-10-CM | POA: Diagnosis not present

## 2020-10-06 DIAGNOSIS — R634 Abnormal weight loss: Secondary | ICD-10-CM | POA: Diagnosis not present

## 2020-10-07 DIAGNOSIS — I639 Cerebral infarction, unspecified: Secondary | ICD-10-CM | POA: Diagnosis not present

## 2020-10-07 DIAGNOSIS — R634 Abnormal weight loss: Secondary | ICD-10-CM | POA: Diagnosis not present

## 2020-10-07 DIAGNOSIS — M6281 Muscle weakness (generalized): Secondary | ICD-10-CM | POA: Diagnosis not present

## 2020-10-07 DIAGNOSIS — R2681 Unsteadiness on feet: Secondary | ICD-10-CM | POA: Diagnosis not present

## 2020-10-07 DIAGNOSIS — R279 Unspecified lack of coordination: Secondary | ICD-10-CM | POA: Diagnosis not present

## 2020-10-07 DIAGNOSIS — R2689 Other abnormalities of gait and mobility: Secondary | ICD-10-CM | POA: Diagnosis not present

## 2020-10-07 DIAGNOSIS — R262 Difficulty in walking, not elsewhere classified: Secondary | ICD-10-CM | POA: Diagnosis not present

## 2020-10-08 DIAGNOSIS — I639 Cerebral infarction, unspecified: Secondary | ICD-10-CM | POA: Diagnosis not present

## 2020-10-08 DIAGNOSIS — R2681 Unsteadiness on feet: Secondary | ICD-10-CM | POA: Diagnosis not present

## 2020-10-08 DIAGNOSIS — R2689 Other abnormalities of gait and mobility: Secondary | ICD-10-CM | POA: Diagnosis not present

## 2020-10-08 DIAGNOSIS — R262 Difficulty in walking, not elsewhere classified: Secondary | ICD-10-CM | POA: Diagnosis not present

## 2020-10-08 DIAGNOSIS — R634 Abnormal weight loss: Secondary | ICD-10-CM | POA: Diagnosis not present

## 2020-10-08 DIAGNOSIS — M6281 Muscle weakness (generalized): Secondary | ICD-10-CM | POA: Diagnosis not present

## 2020-10-08 DIAGNOSIS — R279 Unspecified lack of coordination: Secondary | ICD-10-CM | POA: Diagnosis not present

## 2020-10-09 DIAGNOSIS — M6281 Muscle weakness (generalized): Secondary | ICD-10-CM | POA: Diagnosis not present

## 2020-10-09 DIAGNOSIS — R2689 Other abnormalities of gait and mobility: Secondary | ICD-10-CM | POA: Diagnosis not present

## 2020-10-09 DIAGNOSIS — R279 Unspecified lack of coordination: Secondary | ICD-10-CM | POA: Diagnosis not present

## 2020-10-09 DIAGNOSIS — R2681 Unsteadiness on feet: Secondary | ICD-10-CM | POA: Diagnosis not present

## 2020-10-09 DIAGNOSIS — I639 Cerebral infarction, unspecified: Secondary | ICD-10-CM | POA: Diagnosis not present

## 2020-10-09 DIAGNOSIS — R262 Difficulty in walking, not elsewhere classified: Secondary | ICD-10-CM | POA: Diagnosis not present

## 2020-10-09 DIAGNOSIS — R634 Abnormal weight loss: Secondary | ICD-10-CM | POA: Diagnosis not present

## 2020-10-10 DIAGNOSIS — R279 Unspecified lack of coordination: Secondary | ICD-10-CM | POA: Diagnosis not present

## 2020-10-10 DIAGNOSIS — M6281 Muscle weakness (generalized): Secondary | ICD-10-CM | POA: Diagnosis not present

## 2020-10-10 DIAGNOSIS — R2689 Other abnormalities of gait and mobility: Secondary | ICD-10-CM | POA: Diagnosis not present

## 2020-10-10 DIAGNOSIS — I639 Cerebral infarction, unspecified: Secondary | ICD-10-CM | POA: Diagnosis not present

## 2020-10-10 DIAGNOSIS — R634 Abnormal weight loss: Secondary | ICD-10-CM | POA: Diagnosis not present

## 2020-10-10 DIAGNOSIS — R262 Difficulty in walking, not elsewhere classified: Secondary | ICD-10-CM | POA: Diagnosis not present

## 2020-10-10 DIAGNOSIS — R2681 Unsteadiness on feet: Secondary | ICD-10-CM | POA: Diagnosis not present

## 2020-10-13 DIAGNOSIS — R2689 Other abnormalities of gait and mobility: Secondary | ICD-10-CM | POA: Diagnosis not present

## 2020-10-13 DIAGNOSIS — R279 Unspecified lack of coordination: Secondary | ICD-10-CM | POA: Diagnosis not present

## 2020-10-13 DIAGNOSIS — R2681 Unsteadiness on feet: Secondary | ICD-10-CM | POA: Diagnosis not present

## 2020-10-13 DIAGNOSIS — R262 Difficulty in walking, not elsewhere classified: Secondary | ICD-10-CM | POA: Diagnosis not present

## 2020-10-13 DIAGNOSIS — I639 Cerebral infarction, unspecified: Secondary | ICD-10-CM | POA: Diagnosis not present

## 2020-10-13 DIAGNOSIS — M6281 Muscle weakness (generalized): Secondary | ICD-10-CM | POA: Diagnosis not present

## 2020-10-13 DIAGNOSIS — R634 Abnormal weight loss: Secondary | ICD-10-CM | POA: Diagnosis not present

## 2020-10-14 DIAGNOSIS — R634 Abnormal weight loss: Secondary | ICD-10-CM | POA: Diagnosis not present

## 2020-10-14 DIAGNOSIS — I639 Cerebral infarction, unspecified: Secondary | ICD-10-CM | POA: Diagnosis not present

## 2020-10-14 DIAGNOSIS — R2689 Other abnormalities of gait and mobility: Secondary | ICD-10-CM | POA: Diagnosis not present

## 2020-10-14 DIAGNOSIS — M6281 Muscle weakness (generalized): Secondary | ICD-10-CM | POA: Diagnosis not present

## 2020-10-14 DIAGNOSIS — R262 Difficulty in walking, not elsewhere classified: Secondary | ICD-10-CM | POA: Diagnosis not present

## 2020-10-14 DIAGNOSIS — R279 Unspecified lack of coordination: Secondary | ICD-10-CM | POA: Diagnosis not present

## 2020-10-14 DIAGNOSIS — R2681 Unsteadiness on feet: Secondary | ICD-10-CM | POA: Diagnosis not present

## 2020-10-15 DIAGNOSIS — M6281 Muscle weakness (generalized): Secondary | ICD-10-CM | POA: Diagnosis not present

## 2020-10-15 DIAGNOSIS — R2681 Unsteadiness on feet: Secondary | ICD-10-CM | POA: Diagnosis not present

## 2020-10-15 DIAGNOSIS — R634 Abnormal weight loss: Secondary | ICD-10-CM | POA: Diagnosis not present

## 2020-10-15 DIAGNOSIS — R279 Unspecified lack of coordination: Secondary | ICD-10-CM | POA: Diagnosis not present

## 2020-10-15 DIAGNOSIS — R262 Difficulty in walking, not elsewhere classified: Secondary | ICD-10-CM | POA: Diagnosis not present

## 2020-10-15 DIAGNOSIS — R2689 Other abnormalities of gait and mobility: Secondary | ICD-10-CM | POA: Diagnosis not present

## 2020-10-15 DIAGNOSIS — I639 Cerebral infarction, unspecified: Secondary | ICD-10-CM | POA: Diagnosis not present

## 2020-10-16 DIAGNOSIS — M6281 Muscle weakness (generalized): Secondary | ICD-10-CM | POA: Diagnosis not present

## 2020-10-16 DIAGNOSIS — R262 Difficulty in walking, not elsewhere classified: Secondary | ICD-10-CM | POA: Diagnosis not present

## 2020-10-16 DIAGNOSIS — R2689 Other abnormalities of gait and mobility: Secondary | ICD-10-CM | POA: Diagnosis not present

## 2020-10-16 DIAGNOSIS — R279 Unspecified lack of coordination: Secondary | ICD-10-CM | POA: Diagnosis not present

## 2020-10-16 DIAGNOSIS — R2681 Unsteadiness on feet: Secondary | ICD-10-CM | POA: Diagnosis not present

## 2020-10-16 DIAGNOSIS — I639 Cerebral infarction, unspecified: Secondary | ICD-10-CM | POA: Diagnosis not present

## 2020-10-16 DIAGNOSIS — R634 Abnormal weight loss: Secondary | ICD-10-CM | POA: Diagnosis not present

## 2020-10-17 DIAGNOSIS — R634 Abnormal weight loss: Secondary | ICD-10-CM | POA: Diagnosis not present

## 2020-10-17 DIAGNOSIS — R279 Unspecified lack of coordination: Secondary | ICD-10-CM | POA: Diagnosis not present

## 2020-10-17 DIAGNOSIS — R262 Difficulty in walking, not elsewhere classified: Secondary | ICD-10-CM | POA: Diagnosis not present

## 2020-10-17 DIAGNOSIS — M6281 Muscle weakness (generalized): Secondary | ICD-10-CM | POA: Diagnosis not present

## 2020-10-17 DIAGNOSIS — R2689 Other abnormalities of gait and mobility: Secondary | ICD-10-CM | POA: Diagnosis not present

## 2020-10-17 DIAGNOSIS — I639 Cerebral infarction, unspecified: Secondary | ICD-10-CM | POA: Diagnosis not present

## 2020-10-17 DIAGNOSIS — R2681 Unsteadiness on feet: Secondary | ICD-10-CM | POA: Diagnosis not present

## 2020-10-20 DIAGNOSIS — R2689 Other abnormalities of gait and mobility: Secondary | ICD-10-CM | POA: Diagnosis not present

## 2020-10-20 DIAGNOSIS — M6281 Muscle weakness (generalized): Secondary | ICD-10-CM | POA: Diagnosis not present

## 2020-10-20 DIAGNOSIS — R262 Difficulty in walking, not elsewhere classified: Secondary | ICD-10-CM | POA: Diagnosis not present

## 2020-10-20 DIAGNOSIS — R634 Abnormal weight loss: Secondary | ICD-10-CM | POA: Diagnosis not present

## 2020-10-20 DIAGNOSIS — I639 Cerebral infarction, unspecified: Secondary | ICD-10-CM | POA: Diagnosis not present

## 2020-10-20 DIAGNOSIS — R2681 Unsteadiness on feet: Secondary | ICD-10-CM | POA: Diagnosis not present

## 2020-10-20 DIAGNOSIS — R279 Unspecified lack of coordination: Secondary | ICD-10-CM | POA: Diagnosis not present

## 2020-10-21 DIAGNOSIS — I639 Cerebral infarction, unspecified: Secondary | ICD-10-CM | POA: Diagnosis not present

## 2020-10-21 DIAGNOSIS — R634 Abnormal weight loss: Secondary | ICD-10-CM | POA: Diagnosis not present

## 2020-10-21 DIAGNOSIS — R2689 Other abnormalities of gait and mobility: Secondary | ICD-10-CM | POA: Diagnosis not present

## 2020-10-21 DIAGNOSIS — R262 Difficulty in walking, not elsewhere classified: Secondary | ICD-10-CM | POA: Diagnosis not present

## 2020-10-21 DIAGNOSIS — R2681 Unsteadiness on feet: Secondary | ICD-10-CM | POA: Diagnosis not present

## 2020-10-21 DIAGNOSIS — M6281 Muscle weakness (generalized): Secondary | ICD-10-CM | POA: Diagnosis not present

## 2020-10-21 DIAGNOSIS — R279 Unspecified lack of coordination: Secondary | ICD-10-CM | POA: Diagnosis not present

## 2020-10-23 DIAGNOSIS — R262 Difficulty in walking, not elsewhere classified: Secondary | ICD-10-CM | POA: Diagnosis not present

## 2020-10-23 DIAGNOSIS — M6281 Muscle weakness (generalized): Secondary | ICD-10-CM | POA: Diagnosis not present

## 2020-10-23 DIAGNOSIS — R2681 Unsteadiness on feet: Secondary | ICD-10-CM | POA: Diagnosis not present

## 2020-10-23 DIAGNOSIS — R634 Abnormal weight loss: Secondary | ICD-10-CM | POA: Diagnosis not present

## 2020-10-23 DIAGNOSIS — R2689 Other abnormalities of gait and mobility: Secondary | ICD-10-CM | POA: Diagnosis not present

## 2020-10-23 DIAGNOSIS — R279 Unspecified lack of coordination: Secondary | ICD-10-CM | POA: Diagnosis not present

## 2020-10-23 DIAGNOSIS — I639 Cerebral infarction, unspecified: Secondary | ICD-10-CM | POA: Diagnosis not present

## 2020-11-04 DIAGNOSIS — R600 Localized edema: Secondary | ICD-10-CM | POA: Diagnosis not present

## 2020-11-04 DIAGNOSIS — L853 Xerosis cutis: Secondary | ICD-10-CM | POA: Diagnosis not present

## 2020-11-04 DIAGNOSIS — I739 Peripheral vascular disease, unspecified: Secondary | ICD-10-CM | POA: Diagnosis not present

## 2020-11-04 DIAGNOSIS — L84 Corns and callosities: Secondary | ICD-10-CM | POA: Diagnosis not present

## 2020-11-04 DIAGNOSIS — R262 Difficulty in walking, not elsewhere classified: Secondary | ICD-10-CM | POA: Diagnosis not present

## 2020-11-04 DIAGNOSIS — B351 Tinea unguium: Secondary | ICD-10-CM | POA: Diagnosis not present

## 2020-11-21 ENCOUNTER — Non-Acute Institutional Stay: Payer: Medicare Other | Admitting: Adult Health Nurse Practitioner

## 2020-11-21 ENCOUNTER — Other Ambulatory Visit: Payer: Self-pay

## 2020-11-21 DIAGNOSIS — Z515 Encounter for palliative care: Secondary | ICD-10-CM

## 2020-11-21 DIAGNOSIS — F0150 Vascular dementia without behavioral disturbance: Secondary | ICD-10-CM

## 2020-11-21 NOTE — Progress Notes (Signed)
[place] Consult Note Telephone: [phone]  Fax: [phone]  PATIENT NAME: Lori Berger DOB: 04-Mar-1941 MRN: [id number]  PRIMARY CARE PROVIDER:   Dr. Maryella Shivers  REFERRING PROVIDER:David Herring PA/ Dr. Nyra Capes  RESPONSIBLE PARTY:Jerry Blake, son H: [phone] C: [phone] Lori Berger, daughter-in-law [phone]  Chief complaint:  Increased confusion/agitation   RECOMMENDATIONS and PLAN: 1.Advanced care planning. Patient is DNR/comfort measures.  Spoke with daughter in law via telephone to update on today's visit.  2.  Vascular dementia.  Patient is wheelchair-bound.  She requires assistance with ADLs.  She is able to feed herself and weight has been stable in the 140s.  She is incontinent of bowel and bladder.  Patient's agitation related to sundowning is improved with recent medication changes.  Continue supportive care at facility   Palliative will continue to monitor for symptom management/decline and make recommendations as needed.  Follow-up in 8 to 10 weeks.  Daughter-in-law encouraged to call with any questions or concerns.  I spent 25 minutes providing this consultation, including time with patient/family, provider coordination, chart review, documentation. More than 50% of the time in this consultation was spent coordinating communication.   HISTORY OF PRESENT ILLNESS:  Lori Berger is a 80 y.o. year old female with multiple medical problems including dementia, DMT2, HTN, HLD, depression, frequent falls, recurrent UTIs, h/o CVA. Palliative Care was asked to help address goals of care.  Patient has no new concerns today.  HPI/ROS unreliable secondary to dementia.  Staff with no new concerns.  Staff does report that she will have some sundowning in the evenings but she is not becoming agitated like she used to.  Staff states that she is more pleasantly confused.  Daughter has no new concerns at this  time.  Patient has not had any falls, infection, hospitalizations since last visit.  CODE STATUS: DNR  PPS: 30% HOSPICE ELIGIBILITY/DIAGNOSIS: TBD  PHYSICAL EXAM: HR70O2 98% on RA General: NAD, frail appearing, thin Eyes: Sclera anicteric and noninjected with no discharge noted Cardiovascular: regular rate and rhythm Pulmonary:lung sounds clear; normal respiratory effort Abdomen: soft, nontender, + bowel sounds Extremities: no edema, no joint deformities Skin: no rasheson exposed skin Neurological: Weakness; A&O to person and place  PAST MEDICAL HISTORY:  Past Medical History:  Diagnosis Date  . Arthritis   . BCC (basal cell carcinoma)   . Breast cancer ([place]) 1990   left 1990  . Breast cancer ([place]) 11/11/2015   Right, T2 (2.4 cm(, N0; positive deep margin, ER: 100%, PR: 60%; Her 2 neu not amplified, Mammoprint: low risk  . Diabetes mellitus without complication ([place])   . Endometrial cancer ([place])   . Hyperlipidemia   . Hypertension   . Major depressive disorder   . PONV (postoperative nausea and vomiting)   . UTI (urinary tract infection)   . Yeast infection     SOCIAL HX:  Social History   Tobacco Use  . Smoking status: Never Smoker  . Smokeless tobacco: Never Used  Substance Use Topics  . Alcohol use: No    ALLERGIES:  Allergies  Allergen Reactions  . Penicillins      PERTINENT MEDICATIONS:  Outpatient Encounter Medications as of 11/21/2020  Medication Sig  . ascorbic acid (VITAMIN C) 500 MG tablet Take 1 tablet (500 mg total) by mouth daily.  Marland Kitchen aspirin EC 81 MG tablet Take 1 tablet (81 mg total) by mouth daily.  . bisacodyl (DULCOLAX) 5 MG EC tablet Take 1  tablet (5 mg total) by mouth daily as needed for moderate constipation. (Patient not taking: Reported on 01/28/2020)  . Calcium-Vitamin D-Vitamin K 517-001-74 MG-UNT-MCG TABS Take 1 tablet by mouth daily.  . Canagliflozin-metFORMIN HCl (INVOKAMET) 150-500 MG TABS Take 1 tablet by mouth 2 (two) times  daily.  . clopidogrel (PLAVIX) 75 MG tablet TAKE 1 TABLET BY MOUTH  DAILY (Patient taking differently: Take 75 mg by mouth daily. )  . ELIQUIS 5 MG TABS tablet Take 5 mg by mouth 2 (two) times daily.  Marland Kitchen escitalopram (LEXAPRO) 10 MG tablet Take 10 mg by mouth daily.  . fluconazole (DIFLUCAN) 200 MG tablet Take 200 mg by mouth daily.  Marland Kitchen glucose blood test strip Check sugar daily  . guaiFENesin-dextromethorphan (ROBITUSSIN DM) 100-10 MG/5ML syrup Take 10 mLs by mouth every 4 (four) hours as needed for cough.  . insulin aspart (NOVOLOG) 100 UNIT/ML injection Inject 6 Units into the skin 3 (three) times daily with meals for 5 days.  . Ipratropium-Albuterol (COMBIVENT) 20-100 MCG/ACT AERS respimat Inhale 1 puff into the lungs every 6 (six) hours. (Patient not taking: Reported on 01/28/2020)  . letrozole (FEMARA) 2.5 MG tablet TAKE 1 TABLET BY MOUTH  DAILY (Patient taking differently: Take 2.5 mg by mouth daily. )  . mirtazapine (REMERON) 15 MG tablet Take 1 tablet (15 mg total) by mouth at bedtime.  . Multiple Vitamin (MULTIVITAMIN WITH MINERALS) TABS tablet Take 1 tablet by mouth daily. (Patient not taking: Reported on 01/28/2020)  . Nutritional Supplements (FEEDING SUPPLEMENT, NEPRO CARB STEADY,) LIQD Take 237 mLs by mouth 3 (three) times daily between meals.  . nystatin cream (MYCOSTATIN) Apply 1 application topically 2 (two) times daily. (Patient not taking: Reported on 01/28/2020)  . ondansetron (ZOFRAN) 4 MG tablet Take 1 tablet (4 mg total) by mouth every 6 (six) hours as needed for nausea. (Patient not taking: Reported on 01/28/2020)  . simvastatin (ZOCOR) 10 MG tablet TAKE 1 TABLET BY MOUTH AT  BEDTIME (Patient taking differently: Take 10 mg by mouth at bedtime. )  . traZODone (DESYREL) 50 MG tablet Take 0.5 tablets (25 mg total) by mouth at bedtime as needed for sleep.  Marland Kitchen zinc sulfate 220 (50 Zn) MG capsule Take 1 capsule (220 mg total) by mouth daily. (Patient not taking: Reported on 01/28/2020)    No facility-administered encounter medications on file as of 11/21/2020.      Delvis Kau Jenetta Downer, NP

## 2020-11-26 DIAGNOSIS — C50911 Malignant neoplasm of unspecified site of right female breast: Secondary | ICD-10-CM | POA: Diagnosis not present

## 2020-11-26 DIAGNOSIS — R262 Difficulty in walking, not elsewhere classified: Secondary | ICD-10-CM | POA: Diagnosis not present

## 2020-11-26 DIAGNOSIS — I10 Essential (primary) hypertension: Secondary | ICD-10-CM | POA: Diagnosis not present

## 2020-11-26 DIAGNOSIS — M6281 Muscle weakness (generalized): Secondary | ICD-10-CM | POA: Diagnosis not present

## 2021-02-04 ENCOUNTER — Encounter: Payer: Self-pay | Admitting: Adult Health Nurse Practitioner

## 2021-02-04 ENCOUNTER — Non-Acute Institutional Stay: Payer: 59 | Admitting: Adult Health Nurse Practitioner

## 2021-02-04 ENCOUNTER — Other Ambulatory Visit: Payer: Self-pay

## 2021-02-04 VITALS — HR 74

## 2021-02-04 DIAGNOSIS — F0150 Vascular dementia without behavioral disturbance: Secondary | ICD-10-CM

## 2021-02-04 DIAGNOSIS — Z515 Encounter for palliative care: Secondary | ICD-10-CM

## 2021-02-04 NOTE — Progress Notes (Signed)
[facility] Consult Note Telephone: [phone]  Fax: [phone]    Date of encounter: 02/04/21 PATIENT NAME: Lori Berger [place] [address]   [phone] (home)  DOB: Jul 05, 1941 MRN: [id number] PRIMARY CARE PROVIDER:    Dr. Maryella Shivers  REFERRING PROVIDER:   Roddie Mc, PA/Dr. Maryella Shivers  RESPONSIBLE PARTY:    Contact Information    Name Relation Home Work [place] [phone]  [phone]   Lori Berger, Lori Berger   [phone]       I met face to face with patient in facility. Palliative Care was asked to follow this patient by consultation request of Roddie Mc, PA to address advance care planning and complex medical decision making. This is a follow up visit.  Spoke with daughter-in-law via telephone to update on today's visit.                                   ASSESSMENT AND PLAN / RECOMMENDATIONS:   Advance Care Planning/Goals of Care: Goals include to maximize quality of life and symptom management.   CODE STATUS: DNR  Symptom Management/Plan:  Vascular dementia: Patient is wheelchair-bound and requires assistance with ADLs.  She is able to feed herself and appetite is good with no weight loss reported.  Patient is stable at this time and palliative will continue to monitor for any symptom management/decline.  Continue supportive care at facility.   Follow up Palliative Care Visit: Palliative care will continue to follow for complex medical decision making, advance care planning, and clarification of goals. Return 8-10 weeks or prn.  Daughter-in-law encouraged to call with any questions or concerns  I spent 35 minutes providing this consultation. More than 50% of the time in this consultation was spent in counseling and care coordination.    PPS: 30%  HOSPICE ELIGIBILITY/DIAGNOSIS: TBD  Chief Complaint: Follow-up palliative visit  HISTORY OF PRESENT  ILLNESS:  Lori Berger is a 80 y.o. year old female  with dementia, DMT2, HTN, HLD, depression, frequent falls, recurrent UTIs, h/o CVA.  Patient is in good spirits today and is conversive and smiling with provider.  Patient does state that her right shoulder hurts but states that is getting better.  Daughter does state that she had a fall about 3 to 4 weeks ago with no injuries noted at the time.  Patient is able to move the arm.  Patient does not voice any other concerns.  States that her appetite is good.  Staff have no new concerns and states that her weight has been stable in the 140s.  Patient has not had any infections or hospitalizations since last visit.  History obtained from review of EMR and interview with family, facility staff and Lori Berger.    PHYSICAL EXAM:  General: NAD, frail appearing Eyes: Sclera anicteric and noninjected with no discharge noted ENMT: Moist mucous membranes Cardiovascular: regular rate and rhythm Pulmonary:lung sounds clear; normal respiratory effort Abdomen: soft, nontender, + bowel sounds Extremities: no edema, no joint deformities Skin: no rasheson exposed skin Neurological: Weakness; A&O to personand place  Thank you for the opportunity to participate in the care of Lori Berger.  The palliative care team will continue to follow. Please call our office at [phone] if we can be of additional assistance.   Lori Berger Lori Downer, NP , DNP  This chart was dictated using voice  recognition software. Despite best efforts to proofread, errors can occur which can change the documentation meaning.   COVID-19 PATIENT SCREENING TOOL Asked and negative response unless otherwise noted:   Have you had symptoms of covid, tested positive or been in contact with someone with symptoms/positive test in the past 5-10 days? negative

## 2021-06-05 ENCOUNTER — Other Ambulatory Visit: Payer: Self-pay

## 2021-06-05 ENCOUNTER — Non-Acute Institutional Stay: Payer: 59 | Admitting: Student

## 2021-06-05 DIAGNOSIS — Z515 Encounter for palliative care: Secondary | ICD-10-CM

## 2021-06-05 DIAGNOSIS — F0150 Vascular dementia without behavioral disturbance: Secondary | ICD-10-CM

## 2021-06-05 NOTE — Progress Notes (Signed)
[facility] Consult Note Telephone: [phone]  Fax: [phone]    Date of encounter: 06/05/21 12:49 PM PATIENT NAME: Lori Berger [place] [address]   [phone] (home)  DOB: [birth year] MRN: [id number] PRIMARY CARE PROVIDER:    Dr. Shon Baton PROVIDER:   Dr. Shon Hough  RESPONSIBLE PARTY:    Contact Information     Name [place] Son [phone]  [phone]   Jonathan, [place]   [phone]        I met face to face with patient in the facility. Palliative Care was asked to follow this patient by consultation request of  Dr. Shon Hough to address advance care planning and complex medical decision making. This is a follow up visit.                                   ASSESSMENT AND PLAN / RECOMMENDATIONS:   Advance Care Planning/Goals of Care: Goals include to maximize quality of life and symptom management.   CODE STATUS: DNR  Symptom Management/Plan:  Vascular dementia-patient requires assistance with adl's. Redirect/reorient as needed. She is w/c bound. Good appetite; no weight loss. Will monitor for changes/declines. Continue aspirin, statin as directed.   Follow up Palliative Care Visit: Palliative care will continue to follow for complex medical decision making, advance care planning, and clarification of goals. Return in 8 weeks or prn.  I spent 25 minutes providing this consultation. More than 50% of the time in this consultation was spent in counseling and care coordination.   PPS: 40%  HOSPICE ELIGIBILITY/DIAGNOSIS: TBD  Chief Complaint: Palliative Medicine follow up visit.   HISTORY OF PRESENT ILLNESS:  Lori Berger is a 80 y.o. year old female  with cerebral infarction, vascular dementia, hypertension, T2DM, dermatitis, allergic rhinitis, weight loss, hx of breast cancer, unsteadiness, difficulty ambulating.  Patient resides at [facility].  Patient states she is doing well. Staff deny any recent changes or declines. Patient denies pain,shortness of breath, nausea, constipation. She endorses a good appetite. She states she is sleeping well at night. No recent infections, ER visits or hospitalizations. A 10-point review of systems is negative, except for the pertinent positives and negatives detailed in the HPI.     History obtained from review of EMR, discussion with primary team, and interview with family, facility staff/caregiver and/or Lori Berger.  I reviewed available labs, medications, imaging, studies and related documents from the EMR.  Records reviewed and summarized above.    Physical Exam: Pulse 92, resp 18, sats 97% on room air Constitutional: NAD General: frail appearing EYES: anicteric sclera, lids intact, no discharge  ENMT: intact hearing, oral mucous membranes moist, dentition intact CV: S1S2, RRR, no LE edema Pulmonary: LCTA, no increased work of breathing, no cough Abdomen: normo-active BS + 4 quadrants, soft and non tender GU: deferred MSK: no sarcopenia, moves all extremities, non-ambulatory Skin: warm and dry, no rashes or wounds on visible skin Neuro: generalized weakness, A & O x 2, forgetful Psych: non-anxious affect, pleasant Hem/lymph/immuno: no widespread bruising   Thank you for the opportunity to participate in the care of Lori Berger.  The palliative care team will continue to follow. Please call our office at [phone] if we can be of additional assistance.   Ezekiel Slocumb, NP   COVID-19 PATIENT SCREENING TOOL Asked and negative response unless  otherwise noted:   Have you had symptoms of covid, tested positive or been in contact with someone with symptoms/positive test in the past 5-10 days? No

## 2021-10-13 ENCOUNTER — Other Ambulatory Visit: Payer: Self-pay

## 2021-10-13 ENCOUNTER — Non-Acute Institutional Stay: Payer: 59 | Admitting: Student

## 2021-10-13 DIAGNOSIS — F0150 Vascular dementia without behavioral disturbance: Secondary | ICD-10-CM

## 2021-10-13 DIAGNOSIS — I6930 Unspecified sequelae of cerebral infarction: Secondary | ICD-10-CM

## 2021-10-13 DIAGNOSIS — Z515 Encounter for palliative care: Secondary | ICD-10-CM

## 2021-10-13 NOTE — Progress Notes (Signed)
[place] Consult Note Telephone: [phone]  Fax: [phone]    Date of encounter: 10/13/21 4:33 PM PATIENT NAME: Lori Berger [address]   [phone] (home)  DOB: 31-Aug-1941 MRN: [id number] PRIMARY CARE PROVIDER:    Dr. Ernst Spell, NP  REFERRING PROVIDER:   Dr. Ernst Spell, NP  RESPONSIBLE PARTY:    Contact Information     Name Relation Home Work [place] [phone]  [phone]   Lori Berger   [phone]        I met face to face with patient in the facility. Palliative Care was asked to follow this patient by consultation request of  Dr. Ernst Spell, NP to address advance care planning and complex medical decision making. This is a follow up visit.  Spoke with daughter in law via telephone; provided update on today's visit.                                    ASSESSMENT AND PLAN / RECOMMENDATIONS:   Advance Care Planning/Goals of Care: Goals include to maximize quality of life and symptom management.  CODE STATUS: DNR  Symptom Management/Plan:  Vascular dementia-patient requires assistance with adl's. Redirect/reorient as needed. She is w/c bound. Monitor for falls/safety. Good appetite; weight is stable. Will monitor for changes/declines. Continue aspirin, statin as directed.   Cerebral infarction, late effect CVA-patient requires assistance with adl's. Continue aspirin, Plavix, simvastatin as ordered.   Follow up Palliative Care Visit: Palliative care will continue to follow for complex medical decision making, advance care planning, and clarification of goals. Return in 8 weeks or prn.   This visit was coded based on medical decision making (MDM).  PPS: 40%  HOSPICE ELIGIBILITY/DIAGNOSIS: TBD  Chief Complaint: Palliative Medicine follow up visit.   HISTORY OF PRESENT ILLNESS:  Lori Berger is a 81 y.o. year old  female  with cerebral infarction, vascular dementia, hypertension, T2DM, dermatitis, allergic rhinitis, weight loss, hx of breast cancer, unsteadiness, difficulty ambulating.   Patient resides at [facility]. Patient reports doing well. Staff deny any recent changes or declines. Patient denies pain,shortness of breath, nausea, constipation. She endorses a good appetite. No weight loss reported. Most recent weight 152.3 pounds. She states she is sleeping well at night. No recent infections, ER visits or hospitalizations. A 10-point review of systems is negative, except for the pertinent positives and negatives detailed in the HPI.     History obtained from review of EMR, discussion with primary team, and interview with family, facility staff/caregiver and/or [name].  I reviewed available labs, medications, imaging, studies and related documents from the EMR.  Records reviewed and summarized above.   Physical Exam: Pulse 70, resp 16, sats 99% on room air Constitutional: NAD General: frail appearing EYES: anicteric sclera, lids intact, no discharge  ENMT: intact hearing, oral mucous membranes moist, dentition intact CV: S1S2, RRR, no LE edema Pulmonary: LCTA, no increased work of breathing, no cough, room air Abdomen: normo-active BS + 4 quadrants, soft and non tender, no ascites GU: deferred MSK: no sarcopenia, moves all extremities Skin: warm and dry, no rashes or wounds on visible skin Neuro:  no generalized weakness, A & O x 2, forgetful Psych: non-anxious affect, pleasant Hem/lymph/immuno: no widespread bruising   Thank you for the opportunity to participate in the care of [name].  The palliative care team will continue to follow. Please call our office at [phone] if we can be of additional assistance.   Ezekiel Slocumb, NP   COVID-19 PATIENT SCREENING TOOL Asked and negative response unless otherwise noted:   Have you had symptoms of covid, tested positive or been in  contact with someone with symptoms/positive test in the past 5-10 days? No

## 2021-12-23 ENCOUNTER — Other Ambulatory Visit: Payer: Self-pay

## 2021-12-23 ENCOUNTER — Non-Acute Institutional Stay: Payer: 59 | Admitting: Student

## 2021-12-23 DIAGNOSIS — Z515 Encounter for palliative care: Secondary | ICD-10-CM

## 2021-12-23 DIAGNOSIS — F0150 Vascular dementia without behavioral disturbance: Secondary | ICD-10-CM

## 2021-12-23 DIAGNOSIS — I6930 Unspecified sequelae of cerebral infarction: Secondary | ICD-10-CM

## 2021-12-23 NOTE — Progress Notes (Signed)
? ? ?Manufacturing engineer ?[facility] Consult Note ?Telephone: [phone]  ?Fax: [phone]  ? ? ?Date of encounter: 12/23/21 ?11:45 AM ?PATIENT NAME: Lori Berger ?[place] [address]   ?[phone] (home)  ?DOB: [birth year] ?MRN: [id number] ?PRIMARY CARE PROVIDER:    ?Dr. Ernst Spell, NP ? ?REFERRING PROVIDER:   ? ?Dr. Ernst Spell, NP ?RESPONSIBLE PARTY:    ?Contact Information   ? ? Name Relation Home Work Mobile  ? [place] L Son [phone]  [phone]  ? [place], Szczerba Other   [phone]  ? ?  ? ? ? ?I met face to face with patient and family in the home/facility. Palliative Care was asked to follow this patient by consultation request of  Dr. Ernst Spell, NP to address advance care planning and complex medical decision making. This is a follow up visit. ? ?                                 ASSESSMENT AND PLAN / RECOMMENDATIONS:  ? ?Advance Care Planning/Goals of Care: Goals include to maximize quality of life and symptom management. Patient/health care surrogate gave his/her permission to discuss. ?Our advance care planning conversation included a discussion about:    ?The value and importance of advance care planning  ?Experiences with loved ones who have been seriously ill or have died  ?Exploration of personal, cultural or spiritual beliefs that might influence medical decisions  ?CODE STATUS: DNR ? ?Symptom Management/Plan: ? ?Vascular dementia-patient requires assistance with adl's. Redirect/reorient as needed. She has been stable. Monitor for falls/safety. Good appetite. Will monitor for changes/declines. Continue aspirin, statin as directed.  ?  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 Continue aspirin, Plavix, simvastatin as ordered.  ? ?Follow up Palliative Care Visit: Palliative care will continue to follow for complex medical decision making, advance care planning, and  clarification of goals. Return in 8-12 weeks or prn. ? ? ?This visit was coded based on medical decision making (MDM). ? ?PPS: 40% ? ?HOSPICE ELIGIBILITY/DIAGNOSIS: TBD ? ?Chief Complaint: Palliative Medicine follow up visit.  ? ?HISTORY OF PRESENT ILLNESS:  Lori Berger is a 81 y.o. year old female  with cerebral infarction, vascular dementia, hypertension, T2DM, dermatitis, allergic rhinitis, weight loss, hx of breast cancer, unsteadiness, difficulty ambulating. ? ?Patient resides at [facility]. She has been stable per staff. She denies pain, shortness of breath, nausea, constipation. She is out of bed daily to w/c; propels her self ad lib about unit. Good appetite is endorsed. She is sleeping well. Staff deny any recent changes or declines. A 10-point ROS is negative, except for the pertinent positives and negatives detailed per the HPI.  ? ?History obtained from review of EMR, discussion with primary team, and interview with family, facility staff/caregiver and/or Lori Berger.  ?I reviewed available labs, medications, imaging, studies and related documents from the EMR.  Records reviewed and summarized above.  ? ? ?Physical Exam: ?Weight: 148.7 pounds ?Pulse 68, resp 16, sats 98% on room air ?Constitutional: NAD ?General: frail appearing ?EYES: anicteric sclera, lids intact, no discharge  ?ENMT: intact hearing, oral mucous membranes moist, dentition intact ?CV: S1S2, RRR, no LE edema ?Pulmonary: LCTA, no increased work of breathing, no cough, room air ?Abdomen: normo-active BS + 4 quadrants, soft and non tender, no ascites ?GU: deferred ?MSK: moves all extremities ?Skin: warm and dry, no rashes or wounds on visible  skin ?Neuro: + generalized weakness, A & O x 2, forgetful ?Psych: non-anxious affect, pleasant ?Hem/lymph/immuno: no widespread bruising ? ? ?Thank you for the opportunity to participate in the care of Lori Berger.  The palliative care team will continue to follow. Please call our office at  [phone] if we can be of additional assistance.  ? ?Ezekiel Slocumb, NP  ? ?COVID-19 PATIENT SCREENING TOOL ?Asked and negative response unless otherwise noted:  ? ?Have you had symptoms of covid, tested positive or been in contact with someone with symptoms/positive test in the past 5-10 days? No ? ?

## 2022-02-17 ENCOUNTER — Non-Acute Institutional Stay: Payer: 59 | Admitting: Student

## 2022-02-17 DIAGNOSIS — F0150 Vascular dementia without behavioral disturbance: Secondary | ICD-10-CM

## 2022-02-17 DIAGNOSIS — R634 Abnormal weight loss: Secondary | ICD-10-CM

## 2022-02-17 DIAGNOSIS — I6930 Unspecified sequelae of cerebral infarction: Secondary | ICD-10-CM

## 2022-02-17 DIAGNOSIS — Z515 Encounter for palliative care: Secondary | ICD-10-CM

## 2022-02-18 NOTE — Progress Notes (Signed)
Lori Berger Consult Note Telephone: [phone]  Fax: [phone]    Date of encounter: 02/17/2022  PATIENT NAME: Lori Berger Lori Berger [id number]   [phone] (home)  DOB: 08/30/1941 MRN: [id number] PRIMARY CARE PROVIDER:    Dr. Ernst Spell, NP  REFERRING PROVIDER:   Dr. Ernst Spell, NP  RESPONSIBLE PARTY:    Contact Information     Name Relation Home Work [place] [phone]  [phone]   Lori Berger, [place]   [phone]        I met face to face with patient in the facility. Palliative Care was asked to follow this patient by consultation request of  Dr. Ernst Spell, NP to address advance care planning and complex medical decision making. This is a follow up visit.                                   ASSESSMENT AND PLAN / RECOMMENDATIONS:   Advance Care Planning/Goals of Care: Goals include to maximize quality of life and symptom management. Patient/health care surrogate gave his/her permission to discuss. CODE STATUS: DNR  Symptom Management/Plan:  Vascular dementia-patient requires assistance with adl's. She is out of bed daily to w/c. Her sundowning is worsening. Staff to reorient and redirect as needed. Her appetite has declined some since having Covid-19 infection in the past month. Encourage foods patient enjoys, nutritional supplements.  Cerebral infarction, late effect CVA-patient requires assistance with adl's. Continue aspirin, Plavix, simvastatin as ordered.  Weight loss-patient with 7 pound weight loss in the past 2 months. Recent Covid-19 infection; she is eating 50% of most meals. Continue to offer foods patient enjoys and nutritional supplement increased to BID in between meals. Will monitor for further loss. Continue mirtazapine QHS.   Follow up Palliative Care Visit: Palliative care will continue to follow for complex medical  decision making, advance care planning, and clarification of goals. Return in 6-8 weeks or prn.   This visit was coded based on medical decision making (MDM).  PPS: 40%  HOSPICE ELIGIBILITY/DIAGNOSIS: TBD  Chief Complaint: Palliative Medicine follow up visit.   HISTORY OF PRESENT ILLNESS:  Lori Berger is a 81 y.o. year old female  with cerebral infarction, vascular dementia, hypertension, T2DM, dermatitis, allergic rhinitis, weight loss, hx of breast cancer, unsteadiness, difficulty ambulating.   Patient resides at [facility]. Patient was treated for Covid-19 infection recently. Staff report appetite has declined some; previously eating 75-100%, now eating 50% of meals. Staff report worsening sundowning. She had BMP, CBC, UA ordered; results are pending. She is out of bed daily to w/c; she is dependent for adl's. A 10-point ROS is negative, except for the pertinent positives and negatives detailed per the HPI.   History obtained from review of EMR, discussion with primary team, and interview with family, facility staff/caregiver and/or Lori Berger.  I reviewed available labs, medications, imaging, studies and related documents from the EMR.  Records reviewed and summarized above.    Physical Exam: Weight: 142.4 pounds Pulse 80, resp 16, sats 96% on room air Constitutional: NAD General: frail appearing, thin EYES: anicteric sclera, lids intact, no discharge  ENMT: intact hearing, oral mucous membranes moist, dentition intact CV: S1S2, RRR, no LE edema Pulmonary: LCTA, no increased work of breathing, no cough, room air Abdomen: normo-active BS + 4 quadrants, soft and non tender, no ascites  GU: deferred MSK: non-ambulatory Skin: warm and dry, no rashes or wounds on visible skin Neuro:  no generalized weakness, A & O x 2, forgetful Psych: non-anxious affect, pleasant Hem/lymph/immuno: no widespread bruising   Thank you for the opportunity to participate in the care of Ms.  Berger.  The palliative care team will continue to follow. Please call our office at [phone] if we can be of additional assistance.   Lori Slocumb, NP   COVID-19 PATIENT SCREENING TOOL Asked and negative response unless otherwise noted:   Have you had symptoms of covid, tested positive or been in contact with someone with symptoms/positive test in the past 5-10 days? No

## 2022-04-09 ENCOUNTER — Non-Acute Institutional Stay: Payer: 59 | Admitting: Student

## 2022-04-09 DIAGNOSIS — I6930 Unspecified sequelae of cerebral infarction: Secondary | ICD-10-CM

## 2022-04-09 DIAGNOSIS — Z515 Encounter for palliative care: Secondary | ICD-10-CM

## 2022-04-09 DIAGNOSIS — F0150 Vascular dementia without behavioral disturbance: Secondary | ICD-10-CM

## 2022-04-09 DIAGNOSIS — R634 Abnormal weight loss: Secondary | ICD-10-CM

## 2022-04-10 NOTE — Progress Notes (Signed)
[place] Consult Note Telephone: [phone]  Fax: [phone]    Date of encounter: 04/09/2022  PATIENT NAME: Lori Berger [id number]   [phone] (home)  DOB: July 14, 1941 MRN: [id number] PRIMARY CARE PROVIDER:    Dr. Ernst Spell, NP  REFERRING PROVIDER:   Dr. Ernst Spell, NP  RESPONSIBLE PARTY:    Contact Information     Name Relation Home Work [place] [phone]  [phone]   [place], [place]   [phone]        I met face to face with patient in the facility. Palliative Care was asked to follow this patient by consultation request of  Dr. Ernst Spell, NP to address advance care planning and complex medical decision making. This is a follow up visit.  Orienting Palliative RN PJ Livengood present.                                   ASSESSMENT AND PLAN / RECOMMENDATIONS:   Advance Care Planning/Goals of Care: Goals include to maximize quality of life and symptom management. Patient/health care surrogate gave his/her permission to discuss. CODE STATUS: DNR  Symptom Management/Plan:  Vascular dementia-patient requires assistance with adl's. She is out of bed daily to w/c. NO functional declines reported. 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 Her appetite has been fair; weight improved since last visit after having Covid-19 infection. Staff to continue assisting with adl's, reorient and redirect as needed.   Cerebral infarction, late effect CVA-patient requires assistance with adl's. Continue aspirin, Plavix, simvastatin as ordered.  Weight loss-this has improved; she is eating better. Staff continues to offer foods she enjoys, nutritional supplements. Weight is up to 146.2 pounds; was 142.4 pounds last visit. Continue  mirtazapine QHS, routine weights per facility.   Follow up Palliative Care Visit: Palliative care will continue to follow for complex medical decision making, advance care planning, and clarification of goals. Return in 8 weeks or prn.   This visit was coded based on medical decision making (MDM).  PPS: 40%  HOSPICE ELIGIBILITY/DIAGNOSIS: TBD  Chief Complaint: Palliative Medicine follow up visit.   HISTORY OF PRESENT ILLNESS:  Lori Berger is a 81 y.o. year old female  with  cerebral infarction, vascular dementia, hypertension, T2DM, dermatitis, allergic rhinitis, weight loss, hx of breast cancer, unsteadiness, difficulty ambulating.    Patient resides at [facility]. Her appetite and weight have improved some since last visit. She is out of bed daily to w/c. She is able to pedal self about unit. Staff report patient requiring more redirection/reorientation. She often talks about waiting on school bus, going to school. Patient received in her room. She is trying to pick out clothes to go on an outing. Per staff, resident will be going on outing, but not today. She denies pain, shortness of breath, nausea, constipation. She endorses sleeping well. No recent ED visits or hospitalizations.   History obtained from review of EMR, discussion with primary team, and interview with family, facility staff/caregiver and/or Ms. Murin.  I reviewed available labs, medications, imaging, studies and related documents from the EMR.  Records reviewed and summarized above.    Physical Exam: Weight: 146.2 pounds Pulse 68, resp 16, sats 99% on room air Constitutional: NAD  General: frail appearing EYES: anicteric sclera, lids intact, no discharge  ENMT: intact hearing, oral mucous membranes moist, dentition intact CV: S1S2, RRR, no LE edema Pulmonary: LCTA, no increased work of breathing, no cough, room air Abdomen: normo-active BS + 4 quadrants, soft and non tender GU: deferred MSK: moves all  extremities Skin: warm and dry, no rashes or wounds on visible skin Neuro:  no generalized weakness,  A & O x 2, forgetful Psych: non-anxious affect, pleasant Hem/lymph/immuno: no widespread bruising   Thank you for the opportunity to participate in the care of Ms. Deshazo.  The palliative care team will continue to follow. Please call our office at [phone] if we can be of additional assistance.   Ezekiel Slocumb, NP   COVID-19 PATIENT SCREENING TOOL Asked and negative response unless otherwise noted:   Have you had symptoms of covid, tested positive or been in contact with someone with symptoms/positive test in the past 5-10 days? No

## 2022-07-09 ENCOUNTER — Non-Acute Institutional Stay: Payer: 59 | Admitting: Student

## 2022-07-09 DIAGNOSIS — F0150 Vascular dementia without behavioral disturbance: Secondary | ICD-10-CM

## 2022-07-09 DIAGNOSIS — I6930 Unspecified sequelae of cerebral infarction: Secondary | ICD-10-CM

## 2022-07-09 DIAGNOSIS — R638 Other symptoms and signs concerning food and fluid intake: Secondary | ICD-10-CM

## 2022-07-09 DIAGNOSIS — Z515 Encounter for palliative care: Secondary | ICD-10-CM

## 2022-07-11 NOTE — Progress Notes (Signed)
[facility] Consult Note Telephone: [phone]  Fax: [phone]    Date of encounter: 07/09/2022  PATIENT NAME: Lori Berger [place]  [id number]   [phone] (home)  DOB: 1940-10-25 MRN: [id number] PRIMARY CARE PROVIDER:    Dr. Ernst Spell, NP  REFERRING PROVIDER:   Dr. Ernst Spell, NP  RESPONSIBLE PARTY:    Contact Information     Name Relation Home Work [place] [phone]  [phone]   Lori Berger   [phone]        I met face to face with patient in the facility. Palliative Care was asked to follow this patient by consultation request of Dr. Ernst Spell, NP  to address advance care planning and complex medical decision making. This is a follow up visit.                                   ASSESSMENT AND PLAN / RECOMMENDATIONS:   Advance Care Planning/Goals of Care: Goals include to maximize quality of life and symptom management. Patient/health care surrogate gave his/her permission to discuss. CODE STATUS: DNR  Education provided on Palliative Medicine. Will monitor for changes/declines, provide symptom management as needed.   Symptom Management/Plan:  Vascular dementia, late effect CVA-patient requires assist with all adl's. She has been stable per staff. She does require cueing and redirection. Staff to continue assisting with adl's. Reorient and redirect as needed. Monitor for falls/safety. Continue aspirin, Plavix and statin as directed.   Appetite-appetite fair to good. Continue mirtazapine QHS, Boost supplement BID in between meals. Her weight is up to 154.8 pounds. She had lost weight after having Covid-19 infection. Continue routine weights per facility.   Follow up Palliative Care Visit: Palliative care will continue to follow for complex medical decision making, advance care planning, and clarification of goals. Return in 8  weeks or prn.  This visit was coded based on medical decision making (MDM).  PPS: 40%  HOSPICE ELIGIBILITY/DIAGNOSIS: TBD  Chief Complaint: Palliative Medicine follow up visit.   HISTORY OF PRESENT ILLNESS:  Lori Berger is a 81 y.o. year old female  with cerebral infarction, vascular dementia, hypertension, T2DM, dermatitis, allergic rhinitis, weight loss, hx of breast cancer, unsteadiness, difficulty ambulating.    Patient resides at [facility]. patient has been doing well per staff. Her appetite has been fair to good. She requires redirection and cueing; she still talks about going to school. Patient received sitting up to w/c, watching television in her room. She denies pain, shortness of breath, nausea, constipation. Patient able to answer direct questions. No recent ED visits or hospitalizations.     History obtained from review of EMR, discussion with primary team, and interview with family, facility staff/caregiver and/or Lori Berger.  I reviewed available labs, medications, imaging, studies and related documents from the EMR.  Records reviewed and summarized above.   ROS  Staff contribute to ROS due to patient's dementia.   Physical Exam: Weight: 154.8 pounds Pulse 68, resp 16, sats 96% on room air Constitutional: NAD General: frail appearing EYES: anicteric sclera, lids intact, no discharge  ENMT: intact hearing, oral mucous membranes moist, dentition intact CV: S1S2, RRR, no LE edema Pulmonary: LCTA, no increased work of breathing, no cough, room air Abdomen: normo-active BS + 4 quadrants, soft and non tender GU: deferred MSK: moves all extremities, ambulatory Skin:  warm and dry, no rashes or wounds on visible skin Neuro:  no generalized weakness,  + cognitive impairment Psych: non-anxious affect, A and O x 2, forgetful Hem/lymph/immuno: no widespread bruising   Thank you for the opportunity to participate in the care of Lori Berger.  The palliative care  team will continue to follow. Please call our office at [phone] if we can be of additional assistance.   Ezekiel Slocumb, NP   COVID-19 PATIENT SCREENING TOOL Asked and negative response unless otherwise noted:   Have you had symptoms of covid, tested positive or been in contact with someone with symptoms/positive test in the past 5-10 days? No

## 2022-10-19 ENCOUNTER — Non-Acute Institutional Stay: Payer: 59 | Admitting: Hospice

## 2022-10-19 DIAGNOSIS — Z515 Encounter for palliative care: Secondary | ICD-10-CM

## 2022-10-19 DIAGNOSIS — F339 Major depressive disorder, recurrent, unspecified: Secondary | ICD-10-CM

## 2022-10-19 DIAGNOSIS — R638 Other symptoms and signs concerning food and fluid intake: Secondary | ICD-10-CM

## 2022-10-19 DIAGNOSIS — F0150 Vascular dementia without behavioral disturbance: Secondary | ICD-10-CM

## 2022-10-19 NOTE — Progress Notes (Signed)
    Lori Berger Consult Note Telephone: [phone]  Fax: [phone]      PATIENT NAME: Lori Berger [place] Lori Berger [address]   [phone] (home)  DOB: 1941/06/02 MRN: [id number] PRIMARY CARE PROVIDER:    Dr. Ernst Spell, NP  REFERRING PROVIDER:   Dr. Ernst Spell, NP  RESPONSIBLE PARTY:    Contact Information     Name Relation Home Work [place] [phone]  [phone]   [place], [place]   [phone]        I met face to face with patient in the facility. Palliative Care was asked to follow this patient by consultation request of Dr. Ernst Spell, NP  to address advance care planning and complex medical decision making. This is a follow up visit.                                   ASSESSMENT AND PLAN / RECOMMENDATIONS:   Advance Care Planning/Goals of Care: Goals include to maximize quality of life and symptom management. Patient/health care surrogate gave his/her permission to discuss. CODE STATUS: DNR    Symptom Management/Plan:  Vascular dementia, late effect CVA-memory loss/confusion is progressive in line with Dementia disease trajectory.  Reorient and redirect as needed; fall/safety precautions. FAST 6D, FLACC 0. Continue ongoing supportive care.   Depression: Continue Lexapro. Reports good mood. Continue to encourage socialization and participation in facility acitivites.   Appetite-appetite fair to good. Continue mirtazapine QHS, Boost supplement BID in between meals.  Offer assistance during meals to ensure adequate oral intake.  Continue routine weights per facility.   Follow up Palliative Care Visit: Palliative care will continue to follow for complex medical decision making, advance care planning, and clarification of goals. Return in 8 weeks or prn.   PPS: 40%  HOSPICE ELIGIBILITY/DIAGNOSIS: TBD  Chief Complaint: Palliative Medicine  follow up visit.   HISTORY OF PRESENT ILLNESS:  Lori Berger is a 82 y.o. year old female  with multiple morbidities requiring close monitoring, with high risk of complications and mortality: Cerebral infarction, vascular dementia, hypertension, T2DM, dermatitis, allergic rhinitis, weight loss, hx of breast cancer, unsteadiness, difficulty ambulating.  Patient in no acute distress, denies pain/discomfort.  Rest of 10 point ROS asked and negative. History obtained from review of EMR, discussion with primary team, and interview with family, facility staff/caregiver and/or [name].  I reviewed available labs, medications, imaging, studies and related documents from the EMR.  Records reviewed and summarized above.   I spent 40 minutes providing this consultation; this includes time spent with patient/family, chart review and documentation. More than 50% of the time in this consultation was spent on counseling and coordinating communication.    Thank you for the opportunity to participate in the care of Lori Berger.  The palliative care team will continue to follow. Please call our office at [phone] if we can be of additional assistance.   Teodoro Spray, NP

## 2022-11-19 ENCOUNTER — Non-Acute Institutional Stay: Payer: 59 | Admitting: Hospice

## 2022-11-19 DIAGNOSIS — Z515 Encounter for palliative care: Secondary | ICD-10-CM

## 2022-11-19 DIAGNOSIS — F339 Major depressive disorder, recurrent, unspecified: Secondary | ICD-10-CM

## 2022-11-19 DIAGNOSIS — F0150 Vascular dementia without behavioral disturbance: Secondary | ICD-10-CM

## 2022-11-19 DIAGNOSIS — R638 Other symptoms and signs concerning food and fluid intake: Secondary | ICD-10-CM

## 2022-11-19 NOTE — Progress Notes (Signed)
    [facility] Consult Note Telephone: [phone]  Fax: [phone]      PATIENT NAME: Lori Berger [place] [address]   [phone] (home)  DOB: 09/23/1941 MRN: [id number] PRIMARY CARE PROVIDER:    Dr. Ernst Spell, NP  REFERRING PROVIDER:   Dr. Ernst Spell, NP  RESPONSIBLE PARTY:    Contact Information     Name Relation Home Work [place] [phone]  [phone]   [place], [place]   [phone]        I met face to face with patient in the facility. Palliative Care was asked to follow this patient by consultation request of Dr. Ernst Spell, NP  to address advance care planning and complex medical decision making. This is a follow up visit.                                   ASSESSMENT AND PLAN / RECOMMENDATIONS:   Advance Care Planning/Goals of Care: Goals include to maximize quality of life and symptom management. Patient/health care surrogate gave his/her permission to discuss. CODE STATUS: DNR    Symptom Management/Plan:  Vascular dementia,: memory loss/confusion is progressive in line with Dementia disease trajectory.  Impoverished thoughts, bowel and bladder incontinence.  Provide assistance with ADLs.  Reorient and redirect as needed; fall/safety precautions. FAST 6D, FLACC 0. Continue ongoing supportive care.   Depression: Continue Lexapro. Reports good mood. Continue to encourage socialization and participation in facility acitivites.   Appetite-Continue mirtazapine QHS, Boost supplement BID in between meals.  Offer assistance during meals to ensure adequate oral intake.  Weight weekly x 4.   Follow up Palliative Care Visit: Palliative care will continue to follow for complex medical decision making, advance care planning, and clarification of goals. Return in 8 weeks or prn.   PPS: 40%  HOSPICE ELIGIBILITY/DIAGNOSIS: TBD  Chief  Complaint: Palliative Medicine follow up visit.   HISTORY OF PRESENT ILLNESS:  Lori Berger is a 82 y.o. year old female  with multiple morbidities requiring close monitoring, with high risk of complications and mortality: Cerebral infarction, vascular dementia, hypertension, T2DM, dermatitis, allergic rhinitis, weight loss, hx of breast cancer, unsteadiness, difficulty ambulating.  Patient in no acute distress, denies pain/discomfort.  Rest of 10 point ROS asked and negative. History obtained from review of EMR, discussion with primary team, and interview with family, facility staff/caregiver and/or Ms. Wisinski.  I reviewed available labs, medications, imaging, studies and related documents from the EMR.  Records reviewed and summarized above.   I spent 35 minutes providing this consultation; this includes time spent with patient/family, chart review and documentation. More than 50% of the time in this consultation was spent on counseling and coordinating communication.    Thank you for the opportunity to participate in the care of Ms. Deguia.  The palliative care team will continue to follow. Please call our office at [phone] if we can be of additional assistance.   Teodoro Spray, NP

## 2022-12-16 ENCOUNTER — Non-Acute Institutional Stay: Payer: 59 | Admitting: Hospice

## 2022-12-16 DIAGNOSIS — F0150 Vascular dementia without behavioral disturbance: Secondary | ICD-10-CM

## 2022-12-16 DIAGNOSIS — Z515 Encounter for palliative care: Secondary | ICD-10-CM

## 2022-12-16 DIAGNOSIS — F339 Major depressive disorder, recurrent, unspecified: Secondary | ICD-10-CM

## 2022-12-16 DIAGNOSIS — R634 Abnormal weight loss: Secondary | ICD-10-CM

## 2022-12-16 NOTE — Progress Notes (Signed)
    [facility] Consult Note Telephone: [phone]  Fax: [phone]      PATIENT NAME: Lori Berger [place] [address]   [phone] (home)  DOB: 11-07-1940 MRN: [id number] PRIMARY CARE PROVIDER:    Dr. Ernst Spell, NP  REFERRING PROVIDER:   Dr. Ernst Spell, NP  RESPONSIBLE PARTY:    Contact Information     Name Relation Home Work [place] [phone]  [phone]   Chanette, Demo   [phone]        I met face to face with patient in the facility. Palliative Care was asked to follow this patient by consultation request of Dr. Ernst Spell, NP  to address advance care planning and complex medical decision making. This is a follow up    [facility] / RECOMMENDATIONS:   Advance Care Planning/Goals of Care: Goals include to maximize quality of life and symptom management. Patient/health care surrogate gave his/her permission to discuss. CODE STATUS: DNR  Symptom Management/Plan:  Vascular dementia,: memory loss/confusion at baseline. Provide assistance with ADLs.  Reorient and redirect as needed; fall/safety precautions. FAST 6D, FLACC 0. Continue ongoing supportive care.   Depression: Continue Lexapro. Reports good mood, eating in the dining with other residents. Continue to encourage socialization and participation in facility acitivites.   Weight loss: Current weight 152 pounds 12/09/2022 down from 155 pounds 11/10/2022.  Continue mirtazapine QHS, Boost supplement BID in between meals.  Offer assistance during meals to ensure adequate oral intake.  Weight weekly x 4.   Follow up Palliative Care Visit: Palliative care will continue to follow for complex medical decision making, advance care planning, and clarification of goals. Return in 8 weeks or prn.   PPS: 40%  HOSPICE ELIGIBILITY/DIAGNOSIS: TBD  Chief Complaint: Palliative Medicine  follow up visit.   HISTORY OF PRESENT ILLNESS:  Lori Berger is a 82 y.o. year old female  with multiple morbidities requiring close monitoring, with high risk of complications and mortality: Cerebral infarction, vascular dementia, hypertension, T2DM, dermatitis, allergic rhinitis, weight loss, hx of breast cancer, unsteadiness, difficulty ambulating.  Patient in no acute distress, denies pain/discomfort.  No hospitalization since last visit.  Rest of 10 point ROS asked and negative. History obtained from review of EMR, discussion with primary team, and interview with family, facility staff/caregiver and/or Lori Berger.  I reviewed available labs, medications, imaging, studies and related documents from the EMR.  Records reviewed and summarized above.   I spent 35 minutes providing this consultation; this includes time spent  with patient/family, chart review and documentation. More than 50% of the time in this consultation was spent on counseling and coordinating communication.    Thank you for the opportunity to participate in the care of Lori Berger.  The palliative care team will continue to follow. Please call our office at [phone] if we can be of additional assistance.   Teodoro Spray, NP

## 2023-01-25 ENCOUNTER — Non-Acute Institutional Stay: Payer: 59 | Admitting: Hospice

## 2023-01-25 DIAGNOSIS — Z515 Encounter for palliative care: Secondary | ICD-10-CM

## 2023-01-25 DIAGNOSIS — F0150 Vascular dementia without behavioral disturbance: Secondary | ICD-10-CM

## 2023-01-25 DIAGNOSIS — R634 Abnormal weight loss: Secondary | ICD-10-CM

## 2023-01-25 DIAGNOSIS — F339 Major depressive disorder, recurrent, unspecified: Secondary | ICD-10-CM

## 2023-01-25 NOTE — Progress Notes (Signed)
    Therapist, nutritional Palliative Care Consult Note Telephone: [phone]  Fax: [phone]      PATIENT NAME: Lori Berger [address] [place] [address]   [phone] (home)  DOB: 03/22/1941 MRN: [id number] PRIMARY CARE PROVIDER:    Dr.  Sharmon Revere, NP  REFERRING PROVIDER:   Dr. Veleta Miners, NP  RESPONSIBLE PARTY:    Contact Information     Name Relation Home Work [place] [phone]  [phone]   [place], Lehr   [phone]        I met face to face with patient in the facility. Palliative Care was asked to follow this patient by consultation request of Dr. Veleta Miners, NP  to address advance care planning and complex medical decision making. This is a follow up    ASSESSMENT AND PLAN / RECOMMENDATIONS:   Goals of Care: Goals include to maximize quality of life and symptom management.  CODE STATUS: DNR  MOST form selections include comfort measures, IV fluids if indicated, antibiotics if indicated, no feeding tube.  Symptom Management/Plan:  Vascular dementia,: memory loss/confusion at baseline. Provide assistance with ADLs.  Reorient and redirect as needed; fall/safety precautions. FAST 6D, FLACC 0. Continue ongoing supportive care.  Fall/safety precautions.  Depression: Continue Lexapro and mirtazapine as ordered.  Continue to encourage socialization and participation in facility acitivites.   Weight loss: Improving.  Current weight 156.2 pounds/12/24, 152 pounds 12/09/2022 down from 155 pounds 11/10/2022.  Continue mirtazapine QHS, Boost supplement BID in between meals.  Offer assistance during meals to ensure adequate oral intake.  Weight weekly x 4.   Follow up Palliative Care Visit: Palliative care will continue to follow for complex medical decision making, advance care planning, and clarification of goals. Return in 8 weeks or prn.   PPS: 40%  HOSPICE  ELIGIBILITY/DIAGNOSIS: TBD  Chief Complaint: Palliative Medicine follow up visit.   HISTORY OF PRESENT ILLNESS:  Lori Berger is a 82 y.o. year old female  with multiple morbidities requiring close monitoring, with high risk of complications and mortality: Cerebral infarction, vascular dementia, hypertension, T2DM, dermatitis, allergic rhinitis, weight loss, hx of breast cancer, unsteadiness, difficulty ambulating.  Patient in no acute distress, denies pain/discomfort.  No hospitalization since last visit.  Nursing reports patient is stable, no concerns today.  Rest of 10 point ROS asked and negative. History obtained from review of EMR, discussion with primary team, and interview with family, facility staff/caregiver and/or Ms. Delbene.  I reviewed available labs, medications, imaging, studies and related documents from the EMR.  Records reviewed and summarized above.   I spent 35 minutes providing this consultation; this includes time spent  with patient/family, chart review and documentation. More than 50% of the time in this consultation was spent on counseling and coordinating communication.    Thank you for the opportunity to participate in the care of Lori Berger.  The palliative care team will continue to follow. Please call our office at [phone] if we can be of additional assistance.   [name], NP

## 2023-02-25 ENCOUNTER — Non-Acute Institutional Stay: Payer: 59 | Admitting: Hospice

## 2023-02-25 DIAGNOSIS — Z515 Encounter for palliative care: Secondary | ICD-10-CM

## 2023-02-25 DIAGNOSIS — F0150 Vascular dementia without behavioral disturbance: Secondary | ICD-10-CM

## 2023-02-25 DIAGNOSIS — F339 Major depressive disorder, recurrent, unspecified: Secondary | ICD-10-CM

## 2023-02-25 DIAGNOSIS — R630 Anorexia: Secondary | ICD-10-CM

## 2023-02-25 NOTE — Progress Notes (Signed)
    Therapist, nutritional Palliative Care Consult Note Telephone: [phone]  Fax: [phone]      PATIENT NAME: Lori Berger [address] [place] [address]   [phone] (home)  DOB: Sep 28, 1941 MRN: [id number] PRIMARY CARE PROVIDER:    Dr.  Sharmon Revere, NP  REFERRING PROVIDER:   Dr. Veleta Miners, NP  RESPONSIBLE PARTY:    Contact Information     Name Relation Home Work [place] [phone]  [phone]   Lori, Berger   [phone]        I met face to face with patient in the facility. Palliative Care was asked to follow this patient by consultation request of Dr. Veleta Miners, NP  to address advance care planning and complex medical decision making. This is a follow up    ASSESSMENT AND PLAN / RECOMMENDATIONS:   Goals of Care: Goals include to maximize quality of life and symptom management.  CODE STATUS: DNR  MOST form selections include comfort measures, IV fluids if indicated, antibiotics if indicated, no feeding tube.  Symptom Management/Plan:  Vascular dementia: patient continues to decline in functional status, now non ambulatory, progressive memory loss/confusion, impoverished thought, limited language. Provide assistance with ADLs.  Reorient and redirect as needed; fall/safety precautions. FAST 7A down from FAST 6D Jan-April '24, FLACC 0. Continue ongoing supportive care.  Fall/safety precautions.  Depression: Continue Lexapro and mirtazapine as ordered.  Continue to encourage socialization and participation in facility acitivites.   Poor appetite:  Continue mirtazapine as ordered QHS, Boost supplement BID in between meals.  Offer assistance during meals to ensure adequate oral intake.  Weight weekly x 4.  Current weight 11Ibs 02/08/22 156.2 pounds/12/24,  152 pounds 12/09/2022  155 pounds 11/10/2022.   Follow up Palliative Care Visit: Palliative care will continue  to follow for complex medical decision making, advance care planning, and clarification of goals. Return in 8 weeks or prn.   PPS: 40%  HOSPICE ELIGIBILITY/DIAGNOSIS: TBD  Chief Complaint: Palliative Medicine follow up visit.   HISTORY OF PRESENT ILLNESS:  Lori Berger is a 82 y.o. year old female  with multiple morbidities requiring close monitoring, with high risk of complications and mortality: Cerebral infarction, vascular dementia, hypertension, T2DM, dermatitis, allergic rhinitis, weight loss, hx of breast cancer, unsteadiness, difficulty ambulating.  Patient in no acute distress, denies pain/discomfort.  No hospitalization since last visit.  Nursing reports patient is stable, no concerns today.  Rest of 10 point ROS asked and negative. History obtained from review of EMR, discussion with primary team, and interview with family, facility staff/caregiver and/or Lori Berger.  I reviewed available labs, medications, imaging, studies and related documents from the EMR.  Records reviewed and summarized above.   I spent 35 minutes providing this consultation; this includes time spent  with patient/family, chart review and documentation. More than 84% of the time in this consultation was spent on counseling and coordinating communication.    Thank you for the opportunity to participate in the care of Lori Berger.  The palliative care team will continue to follow. Please call our office at [phone] if we can be of additional assistance.   [name], NP

## 2024-07-04 DEATH — deceased
